# Patient Record
Sex: Female | Born: 1965
Health system: Southern US, Community
[De-identification: ages and names within clinical notes are randomized; demographics above are authoritative.]

## PROBLEM LIST (undated history)

## (undated) DIAGNOSIS — M797 Fibromyalgia: Secondary | ICD-10-CM

## (undated) DIAGNOSIS — Z8619 Personal history of other infectious and parasitic diseases: Secondary | ICD-10-CM

## (undated) DIAGNOSIS — J329 Chronic sinusitis, unspecified: Secondary | ICD-10-CM

## (undated) DIAGNOSIS — J40 Bronchitis, not specified as acute or chronic: Secondary | ICD-10-CM

## (undated) DIAGNOSIS — F32A Depression, unspecified: Secondary | ICD-10-CM

## (undated) DIAGNOSIS — I8390 Asymptomatic varicose veins of unspecified lower extremity: Secondary | ICD-10-CM

## (undated) DIAGNOSIS — R112 Nausea with vomiting, unspecified: Secondary | ICD-10-CM

## (undated) DIAGNOSIS — D649 Anemia, unspecified: Secondary | ICD-10-CM

## (undated) DIAGNOSIS — M1990 Unspecified osteoarthritis, unspecified site: Secondary | ICD-10-CM

## (undated) DIAGNOSIS — Z9889 Other specified postprocedural states: Secondary | ICD-10-CM

## (undated) DIAGNOSIS — G4730 Sleep apnea, unspecified: Secondary | ICD-10-CM

## (undated) DIAGNOSIS — R569 Unspecified convulsions: Secondary | ICD-10-CM

## (undated) DIAGNOSIS — R609 Edema, unspecified: Secondary | ICD-10-CM

## (undated) DIAGNOSIS — J189 Pneumonia, unspecified organism: Secondary | ICD-10-CM

## (undated) DIAGNOSIS — F329 Major depressive disorder, single episode, unspecified: Secondary | ICD-10-CM

## (undated) DIAGNOSIS — R002 Palpitations: Secondary | ICD-10-CM

## (undated) DIAGNOSIS — I4891 Unspecified atrial fibrillation: Secondary | ICD-10-CM

## (undated) DIAGNOSIS — E669 Obesity, unspecified: Secondary | ICD-10-CM

## (undated) HISTORY — DX: Palpitations: R00.2

## (undated) HISTORY — DX: Unspecified osteoarthritis, unspecified site: M19.90

## (undated) HISTORY — DX: Edema, unspecified: R60.9

## (undated) HISTORY — PX: ABDOMINAL HYSTERECTOMY: SHX81

## (undated) HISTORY — DX: Anemia, unspecified: D64.9

## (undated) HISTORY — DX: Pneumonia, unspecified organism: J18.9

## (undated) HISTORY — PX: VARICOSE VEIN SURGERY: SHX832

## (undated) HISTORY — PX: COLONOSCOPY: SHX174

## (undated) HISTORY — DX: Obesity, unspecified: E66.9

## (undated) HISTORY — DX: Unspecified convulsions: R56.9

## (undated) HISTORY — DX: Fibromyalgia: M79.7

## (undated) HISTORY — PX: ABLATION SAPHENOUS VEIN W/ RFA: SUR11

## (undated) HISTORY — DX: Personal history of other infectious and parasitic diseases: Z86.19

## (undated) HISTORY — DX: Chronic sinusitis, unspecified: J32.9

---

## 1989-08-15 HISTORY — PX: TUBAL LIGATION: SHX77

## 1999-09-14 ENCOUNTER — Emergency Department (HOSPITAL_COMMUNITY): Admission: EM | Admit: 1999-09-14 | Discharge: 1999-09-14 | Payer: Self-pay | Admitting: Emergency Medicine

## 1999-09-15 ENCOUNTER — Emergency Department (HOSPITAL_COMMUNITY): Admission: EM | Admit: 1999-09-15 | Discharge: 1999-09-15 | Payer: Self-pay | Admitting: *Deleted

## 2003-06-04 ENCOUNTER — Emergency Department (HOSPITAL_COMMUNITY): Admission: EM | Admit: 2003-06-04 | Discharge: 2003-06-04 | Payer: Self-pay | Admitting: Emergency Medicine

## 2003-06-04 ENCOUNTER — Encounter: Payer: Self-pay | Admitting: Emergency Medicine

## 2006-02-25 ENCOUNTER — Emergency Department (HOSPITAL_COMMUNITY): Admission: EM | Admit: 2006-02-25 | Discharge: 2006-02-25 | Payer: Self-pay | Admitting: Emergency Medicine

## 2009-09-27 ENCOUNTER — Emergency Department (HOSPITAL_COMMUNITY): Admission: EM | Admit: 2009-09-27 | Discharge: 2009-09-27 | Payer: Self-pay | Admitting: Emergency Medicine

## 2011-03-04 ENCOUNTER — Ambulatory Visit: Payer: Self-pay | Admitting: Family

## 2011-03-07 ENCOUNTER — Encounter: Payer: Self-pay | Admitting: Family

## 2011-03-07 ENCOUNTER — Ambulatory Visit (INDEPENDENT_AMBULATORY_CARE_PROVIDER_SITE_OTHER): Payer: BC Managed Care – PPO | Admitting: Family

## 2011-03-07 DIAGNOSIS — R635 Abnormal weight gain: Secondary | ICD-10-CM

## 2011-03-07 DIAGNOSIS — J329 Chronic sinusitis, unspecified: Secondary | ICD-10-CM | POA: Insufficient documentation

## 2011-03-07 DIAGNOSIS — R609 Edema, unspecified: Secondary | ICD-10-CM

## 2011-03-07 DIAGNOSIS — I8390 Asymptomatic varicose veins of unspecified lower extremity: Secondary | ICD-10-CM

## 2011-03-07 DIAGNOSIS — E669 Obesity, unspecified: Secondary | ICD-10-CM | POA: Insufficient documentation

## 2011-03-07 DIAGNOSIS — I83893 Varicose veins of bilateral lower extremities with other complications: Secondary | ICD-10-CM

## 2011-03-07 DIAGNOSIS — I83899 Varicose veins of unspecified lower extremities with other complications: Secondary | ICD-10-CM | POA: Insufficient documentation

## 2011-03-07 LAB — BASIC METABOLIC PANEL
BUN: 17 mg/dL (ref 6–23)
CO2: 22 mEq/L (ref 19–32)
Calcium: 9.1 mg/dL (ref 8.4–10.5)
Chloride: 104 mEq/L (ref 96–112)
Creat: 0.6 mg/dL (ref 0.50–1.10)
Glucose, Bld: 85 mg/dL (ref 70–99)
Potassium: 4.2 mEq/L (ref 3.5–5.3)
Sodium: 136 mEq/L (ref 135–145)

## 2011-03-07 LAB — TSH: TSH: 1.565 u[IU]/mL (ref 0.350–4.500)

## 2011-03-07 MED ORDER — AMOXICILLIN 500 MG PO CAPS: 1000.0000 mg | ORAL_CAPSULE | Freq: Two times a day (BID) | ORAL | Status: AC

## 2011-03-07 MED ORDER — FUROSEMIDE 20 MG PO TABS: 20.0000 mg | ORAL_TABLET | Freq: Two times a day (BID) | ORAL | Status: DC

## 2011-03-07 MED ORDER — FUROSEMIDE 20 MG PO TABS: ORAL_TABLET | ORAL | Status: DC

## 2011-03-07 NOTE — Assessment & Plan Note (Signed)
We discussed portion control, increasing fruits/veggies, and importance of regular exercise.  Will also check TSH to exclude medical factor contributing to her difficulty losing weight. And

## 2011-03-07 NOTE — Assessment & Plan Note (Signed)
Will refer to Vascular surgeon for further evaluation as she is have swelling and pain associated with these varicose veins.  Will also add lasix 20mg  once daily PRN.

## 2011-03-07 NOTE — Patient Instructions (Addendum)
Call if your sinus symptoms worsen or if they do not improve. Complete lab work on the first floor.  You will be contacted about your referral to the Vascular specialist. Follow up in 1 month, come fasting to this appointment.  Welcome to [facility]!

## 2011-03-07 NOTE — Progress Notes (Signed)
Subjective:    Patient ID: Alexandra Mata, female    DOB: [birth year], 45 y.o.   MRN: [id number]  HPI  Ms.  Klett is a 45 yr old female who presents today to establish care.  Daughter was recently diagnosed with Hashimoto's, age 9.    Seizure in HS- several seizures in HS- work up was negative.  No seizures since.    Leg swelling- reports that her Legs become very swollen at night.  Worse the last 2-3 months.  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  She reports + varicose veins "all my life."  She is interested in persuing treatment of her varicose veins.   Obesity- reports that she has been unable to "lose weight."  Not exercising regularly.    Yellow sinus drainage.  Symptoms started 3 weeks ago.  Symptoms are associated with nasal congestion. Denies associated HA or fever.  Symptoms improved  by nothing, and worsened by nothing.   Review of Systems  Constitutional: Negative for unexpected weight change.  HENT: Positive for rhinorrhea.   Eyes: Negative for visual disturbance.  Respiratory: Negative for cough.   Cardiovascular: Positive for leg swelling.       Notes occasional palpitations- sometimes associated with stress.  Gastrointestinal: Negative for nausea, vomiting and diarrhea.  Genitourinary: Negative for dysuria and hematuria.  Musculoskeletal: Negative for arthralgias.       Mild neck pain.  Skin: Negative for rash.  Neurological: Negative for headaches.  Hematological: Negative for adenopathy.  Psychiatric/Behavioral:       Denies depression or anxiety   Past Medical History  Diagnosis Date  . History of chicken pox   . Seizures     Pt states she had a couple of seizures in high school of undetermined cause    History   Social History  . Marital Status: Married    Spouse Name: N/A    Number of Children: 6  . Years of Education: N/A   Occupational History  . Not on file.   Social History Main  Topics  . Smoking status: Never Smoker   . Smokeless tobacco: Never Used  . Alcohol Use: No  . Drug Use: Not on file  . Sexually Active: Not on file   Other Topics Concern  . 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 Tubal ligation 1991    `    Family History  Problem Relation Age of Onset  . Arthritis Mother   . Hypertension Mother   . Diabetes Mother   . Asthma Mother   . Hypertension Father   . Diabetes Maternal Grandmother   . Hypertension Maternal Grandmother   . Heart disease Maternal Grandmother   . Diabetes Paternal Grandmother   . Cancer Paternal Grandfather     lung    No Known Allergies  No current outpatient prescriptions on file prior to visit.    BP 126/88  Pulse 78  Temp(Src) 97.8 F (36.6 C) (Oral)  Resp 16  Ht 5' 6.5" (1.689 m)  Wt 234 lb 1.9 oz (106.196 kg)  BMI 37.22 kg/m2  LMP 02/18/2011  Objective:   Physical Exam  Constitutional: She appears well-developed and well-nourished.       Overweight white female, NAD  HENT:  Right Ear: Tympanic membrane and ear canal normal.  Left Ear: Tympanic membrane and ear canal normal.  Mouth/Throat: Uvula is midline, oropharynx is clear and moist and mucous membranes are normal.  Eyes: Conjunctivae are normal.  Neck: Normal range of motion. Neck supple. No thyromegaly present.  Cardiovascular: Normal rate and regular rhythm.   Pulmonary/Chest: Effort normal and breath sounds normal.  Abdominal: Soft. Bowel sounds are normal.  Skin: Skin is warm and dry.       Tortuous bilateral LE varicose veins.  Multiple LE spider veins are also noted.     Psychiatric: She has a normal mood and affect. Her behavior is normal. Judgment and thought content normal.          Assessment & Plan:

## 2011-03-07 NOTE — Assessment & Plan Note (Signed)
Will treat with amoxicillin.

## 2011-03-08 ENCOUNTER — Encounter: Payer: Self-pay | Admitting: Family

## 2011-03-29 ENCOUNTER — Encounter: Payer: Self-pay | Admitting: Family

## 2011-04-04 ENCOUNTER — Encounter: Payer: Self-pay | Admitting: Family

## 2011-04-04 ENCOUNTER — Ambulatory Visit (INDEPENDENT_AMBULATORY_CARE_PROVIDER_SITE_OTHER): Payer: BC Managed Care – PPO | Admitting: Family

## 2011-04-04 ENCOUNTER — Other Ambulatory Visit (HOSPITAL_COMMUNITY)
Admission: RE | Admit: 2011-04-04 | Discharge: 2011-04-04 | Disposition: A | Discharge status: Home / Self Care | Payer: BC Managed Care – PPO | Source: Ambulatory Visit | Attending: Family | Admitting: Family

## 2011-04-04 DIAGNOSIS — Z01419 Encounter for gynecological examination (general) (routine) without abnormal findings: Secondary | ICD-10-CM

## 2011-04-04 DIAGNOSIS — Z23 Encounter for immunization: Secondary | ICD-10-CM

## 2011-04-04 DIAGNOSIS — Z0000 Encounter for general adult medical examination without abnormal findings: Secondary | ICD-10-CM | POA: Insufficient documentation

## 2011-04-04 DIAGNOSIS — E669 Obesity, unspecified: Secondary | ICD-10-CM

## 2011-04-04 NOTE — Assessment & Plan Note (Addendum)
Pt was counseled on diet, exercise, weight loss.  Recommended 30 minutes a day of exercise and to keep calories to 1200 a day.  Will also refer to nutritionist at pt request to help with weight loss. Pap performed today.  Mammogram ordered.  Will plan to have pt return fasting for FLP.  EKG performed today and shows normal sinus rhythm without ST changes.

## 2011-04-04 NOTE — Progress Notes (Signed)
Subjective:    Patient ID: Alexandra Mata, female    DOB: 1965-11-10, 45 y.o.   MRN: [id number]  HPI  Drinking more water, cut out sweets,  Walking 30 minutes a week.  Brother recently had an MI.  Last pap smear was >10 yrs ago.  Does not have a GYN. Last mammogram- never. Last tetanus >10 yrs ago.  Brother had MI recently which is worrying her.    Edema- Rarely using lasix due to "dry mouth" side effect.  She does report some improvement in the edema when she does use it.   Varicose Veins- has apt arranged for consultation with vascular.   Review of Systems  Constitutional: Negative for fever.  HENT: Negative for hearing loss.   Eyes:       Needs eye exam.  "blurry vision with reading."  Respiratory: Negative for shortness of breath.   Cardiovascular: Positive for leg swelling. Negative for chest pain.  Gastrointestinal: Negative for nausea, vomiting and abdominal pain.  Genitourinary: Negative for dysuria and frequency.  Musculoskeletal:       Bilateral knee pain.  Neurological:       Notes occasional HA's  Hematological: Negative for adenopathy.  Psychiatric/Behavioral:       Some stress with family.  Denies depression/anxiety   Past Medical History  Diagnosis Date  . History of chicken pox   . Seizures     Pt states she had a couple of seizures in high school of undetermined cause    History   Social History  . Marital Status: Married    Spouse Name: N/A    Number of Children: 6  . Years of Education: N/A   Occupational History  . Not on file.   Social History Main Topics  . Smoking status: Never Smoker   . Smokeless tobacco: Never Used  . Alcohol Use: No  . Drug Use: Not on file  . Sexually Active: Not on file   Other Topics Concern  . 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 Tubal ligation 1991    `    Family History  Problem Relation Age of Onset  . Arthritis Mother   . Hypertension Mother   . Diabetes Mother   . Asthma Mother   . Hypertension Father   . Diabetes Maternal Grandmother   . Hypertension Maternal Grandmother   . Heart disease Maternal Grandmother   . Diabetes Paternal Grandmother   . Cancer Paternal Grandfather     lung  . Coronary artery disease Brother     No Known Allergies  Current Outpatient Prescriptions on File Prior to Visit  Medication Sig Dispense Refill  . B Complex Vitamins (B-COMPLEX/B-12) TABS Take 2 tablets by mouth daily.        . furosemide (LASIX) 20 MG tablet One tablet once daily as needed for swelling.  30 tablet  0    BP 120/80  Pulse 84  Temp(Src) 97.8 F (36.6 C) (Oral)  Resp 16  Ht 5' 6.5" (1.689 m)  Wt 235 lb 1.3 oz (106.632 kg)  BMI 37.37 kg/m2  LMP 03/21/2011        Objective:  Physical Exam  Constitutional: She is oriented to person, place, and time. She appears well-developed and well-nourished.       Overweight white female, NAD  HENT:  Head: Normocephalic and atraumatic.  Right Ear: Tympanic membrane and ear canal normal.  Left Ear: Tympanic membrane and ear canal normal.  Neck: Normal range of motion. Neck supple. No thyromegaly present.  Cardiovascular: Normal rate.  Exam reveals no friction rub.   No murmur heard. Pulmonary/Chest: Effort normal and breath sounds normal. No respiratory distress. She has no wheezes. She has no rales. She exhibits no tenderness.  Abdominal: Soft. Bowel sounds are normal. She exhibits no distension and no mass. There is no tenderness. There is no rebound and no guarding.  Genitourinary: Vagina normal. No breast swelling, tenderness or discharge. Pelvic exam was performed with  patient prone. No erythema around the vagina. No signs of injury around the vagina. No vaginal discharge found.       Cervix is normal without lesions.  Uterine size is normal, adnexa normal without fullness.   Musculoskeletal: Normal range of motion.  Lymphadenopathy:    She has no cervical adenopathy.  Neurological: She is alert and oriented to person, place, and time. She has normal reflexes.  Skin: Skin is warm and dry.  Psychiatric: She has a normal mood and affect. Her behavior is normal. Judgment and thought content normal.          Assessment & Plan:

## 2011-04-04 NOTE — Patient Instructions (Addendum)
Return to the lab fasting one day this week. You will be contacted about our referral to the nutritionist. Try to keep your calories to 1200 a day and exercise at least 30 minutes a day. Record your calories daily : [email or web address] Follow up in 6 months, sooner if problems or concerns.

## 2011-04-05 ENCOUNTER — Encounter: Payer: Self-pay | Admitting: Family

## 2011-04-07 ENCOUNTER — Encounter: Payer: Self-pay | Admitting: Vascular Surgery

## 2011-04-11 ENCOUNTER — Ambulatory Visit: Payer: BC Managed Care – PPO | Admitting: *Deleted

## 2011-05-02 ENCOUNTER — Telehealth: Payer: Self-pay | Admitting: Family

## 2011-05-02 NOTE — Telephone Encounter (Signed)
Pt is due for fasting labs as outlined in 8/20 note.  Please call pt and ask her to return fasting to the lab this week.

## 2011-05-03 NOTE — Telephone Encounter (Signed)
Left message on machine to return my call. 

## 2011-05-04 NOTE — Telephone Encounter (Signed)
Pt notified and will return to the lab today. Order faxed to the lab.

## 2011-05-06 ENCOUNTER — Encounter: Payer: Self-pay | Admitting: Vascular Surgery

## 2011-05-06 ENCOUNTER — Other Ambulatory Visit: Payer: Self-pay

## 2011-05-06 DIAGNOSIS — I83893 Varicose veins of bilateral lower extremities with other complications: Secondary | ICD-10-CM

## 2011-05-09 ENCOUNTER — Encounter: Payer: Self-pay | Admitting: Vascular Surgery

## 2011-05-09 ENCOUNTER — Ambulatory Visit (INDEPENDENT_AMBULATORY_CARE_PROVIDER_SITE_OTHER): Payer: BC Managed Care – PPO | Admitting: Vascular Surgery

## 2011-05-09 ENCOUNTER — Other Ambulatory Visit (INDEPENDENT_AMBULATORY_CARE_PROVIDER_SITE_OTHER): Payer: BC Managed Care – PPO | Admitting: Vascular Surgery

## 2011-05-09 VITALS — BP 119/78 | HR 88 | Resp 20 | Ht 67.0 in | Wt 220.0 lb

## 2011-05-09 DIAGNOSIS — M7989 Other specified soft tissue disorders: Secondary | ICD-10-CM

## 2011-05-09 DIAGNOSIS — I83893 Varicose veins of bilateral lower extremities with other complications: Secondary | ICD-10-CM

## 2011-05-09 DIAGNOSIS — M79609 Pain in unspecified limb: Secondary | ICD-10-CM

## 2011-05-09 NOTE — Progress Notes (Signed)
Addended by: Merri Ray A on: 05/09/2011 03:15 PM   Modules accepted: Orders

## 2011-05-09 NOTE — Progress Notes (Signed)
HERE TO ASSESS HER BILATERAL VARICOSE VEINS.

## 2011-05-09 NOTE — Progress Notes (Signed)
Subjective:     Patient ID: Alexandra Mata, female   DOB: 06-Jan-1966, 45 y.o.   MRN: [id number]  HPI this 45 year old female was referred by Dr. Peggyann Juba for evaluation of bilateral venous insufficiency. The patient has had varicose veins in both legs for the past 20 years or so. She has had a history of one stasis ulcer in the left ankle a few months ago which did heal. She has had no bleeding, DVT, thrombophlebitis, or skin breakdown on the right leg. She does not wear elastic compression stockings and only elevates her legs on occasion although it does make her legs feel better. He is not taking pain medicine a regular basis. She has been having increasing discomfort in the legs as the day progresses. She describes this as an aching throbbing burning discomfort in the thigh and calf areas right worse than left.  Past Medical History  Diagnosis Date  . History of chicken pox   . Seizures     Pt states she had a couple of seizures in high school of undetermined cause  . Asymptomatic varicose veins   . Leg swelling   . Obesity   . Edema   . Unspecified sinusitis (chronic)     History  Substance Use Topics  . Smoking status: Never Smoker   . Smokeless tobacco: Never Used  . Alcohol Use: No    Family History  Problem Relation Age of Onset  . Arthritis Mother   . Hypertension Mother   . Diabetes Mother   . Asthma Mother   . Hypertension Father   . Diabetes Maternal Grandmother   . Hypertension Maternal Grandmother   . Heart disease Maternal Grandmother   . Diabetes Paternal Grandmother   . Cancer Paternal Grandfather     lung  . Coronary artery disease Brother     No Known Allergies  Current outpatient prescriptions:amoxicillin (AMOXIL) 500 MG capsule, Take 500 mg by mouth 2 (two) times daily.  , Disp: , Rfl: ;  B Complex Vitamins (B-COMPLEX/B-12) TABS, Take 2 tablets by mouth daily.  , Disp: , Rfl: ;  furosemide (LASIX) 20 MG tablet, One tablet once daily as needed for  swelling., Disp: 30 tablet, Rfl: 0  BP 119/78  Pulse 88  Resp 20  Ht 5\' 7"  (1.702 m)  Wt 220 lb (99.791 kg)  BMI 34.46 kg/m2  Body mass index is 34.46 kg/(m^2).         Review of Systems chief complaints of occasional dizziness, blackouts, headaches, seizure in 1984 and 1993, bronchitis, leg discomfort with walking. All other systems are completely negative and a complete review of systems     Objective:   Physical Exam blood pressure 119/78 heart rate 88 respirations 20  General she is well-developed obese middle-aged female no apparent distress alert and oriented x3 Chest clear no rhonchi or wheezing Cardiovascular regular rhythm no murmurs carotid pulses 3+ no bruits Abdomen soft obese no palpable masses Neurologic exam normal Musculoskeletal exam for emotional deformities Skin free of rashes Extremity exam 3+ femoral and popliteal and dorsalis pedis pulses palpable bilaterally She has diffuse bulging varicosities in the right leg beginning at the saphenofemoral junction and extending anterolaterally lateral to the knee and into the lateral calf with multiple spider and reticular veins anteriorly and posteriorly and 2+ edema  Left leg has diffuse spider and reticular veins with no bulging varicosities noted with 2+ edema in an area in the left ankle where the previous ulcer has healed  Today I ordered bilateral venous duplex exam which I reviewed and interpreted. She does have significant reflux bilaterally. On the right side this involves the anterolateral branch of the great saphenous vein at the junction and the right small saphenous vein and on the left side it involves the left anterolateral branch up to the junction.    Assessment:    bilateral venous insufficiency secondary to venous hypertension from valvular incompetence and reflux. I'm not certain we have a long enough segment in the anterolateral branches for laser ablation. She does need stab phlebectomy for  treatment of her symptomatology.    Plan:     #1 lumbar elastic compression stockings-20-30 mm gradient #2 elevate legs as much as possible #3 ibuprofen on a daily basis #4 return in 3 months. At that time we will see if conservative regimen has improved her symptomatology. If not she would be a candidate for stab phlebectomy of varicosities right leg. We will reassess her anterolateral right saphenous veins and right small saphenous vein to see if laser ablation would be an option. We will do this with the SonoSite ultrasound at the bedside

## 2011-05-10 ENCOUNTER — Encounter: Payer: BC Managed Care – PPO | Admitting: Vascular Surgery

## 2011-05-13 ENCOUNTER — Encounter (INDEPENDENT_AMBULATORY_CARE_PROVIDER_SITE_OTHER): Payer: BC Managed Care – PPO

## 2011-05-13 DIAGNOSIS — I83893 Varicose veins of bilateral lower extremities with other complications: Secondary | ICD-10-CM

## 2011-05-14 LAB — HEPATIC FUNCTION PANEL
Alkaline Phosphatase: 42 U/L (ref 39–117)
Bilirubin, Direct: 0.2 mg/dL (ref 0.0–0.3)
Indirect Bilirubin: 0.4 mg/dL (ref 0.0–0.9)

## 2011-05-14 LAB — CBC WITH DIFFERENTIAL/PLATELET
Basophils Absolute: 0 10*3/uL (ref 0.0–0.1)
Eosinophils Relative: 2 % (ref 0–5)
HCT: 35.6 % — ABNORMAL LOW (ref 36.0–46.0)
Lymphocytes Relative: 24 % (ref 12–46)
Lymphs Abs: 1.6 10*3/uL (ref 0.7–4.0)
MCV: 89.7 fL (ref 78.0–100.0)
Neutro Abs: 4.5 10*3/uL (ref 1.7–7.7)
Platelets: 192 10*3/uL (ref 150–400)
RBC: 3.97 MIL/uL (ref 3.87–5.11)
RDW: 13.5 % (ref 11.5–15.5)
WBC: 6.9 10*3/uL (ref 4.0–10.5)

## 2011-05-14 LAB — LIPID PANEL
HDL: 40 mg/dL (ref >39)
LDL Cholesterol: 88 mg/dL (ref 0–99)
Total CHOL/HDL Ratio: 3.5 Ratio
Triglycerides: 51 mg/dL (ref <150)
VLDL: 10 mg/dL (ref 0–40)

## 2011-05-16 NOTE — Procedures (Unsigned)
LOWER EXTREMITY VENOUS REFLUX EXAM  INDICATION:  Bilateral lower extremity pain, varicose veins, and edema.  EXAM:  Using color-flow imaging and pulse Doppler spectral analysis, the bilateral common femoral, superficial femoral, popliteal, posterior tibial, greater and lesser saphenous veins are evaluated.  There is no evidence suggesting deep venous insufficiency in the bilateral lower extremities.  The bilateral saphenofemoral junctions are competent.  The right GSV is competent.  The left GSV is not competent with reflux of greater than with the caliber as described below.  The right proximal short saphenous vein demonstrates incompetency. Diameter measurements range from 0.26 cm to 0.54 cm.  The left proximal short saphenous vein demonstrates competency.  GSV Diameter (used if found to be incompetent only)                                           Right    Left Proximal Greater Saphenous Vein           cm       0.71 cm Proximal-to-mid-thigh                     0.42 cm  0.47 cm Mid thigh                                 0.32 cm  0.34 cm Mid-distal thigh                          cm       cm Distal thigh                              0.41 cm  0.45 cm Knee                                      0.41 cm  0.25 cm  IMPRESSION: 1. The right greater saphenous vein is competent. 2. The greater saphenous vein is not competent with reflux of greater     than500 milliseconds. 3. The bilateral greater saphenous veins are not tortuous. 4. The deep venous system bilaterally is competent. 5. The right lesser saphenous vein is not competent with reflux of     greater than 500 milliseconds. 6. The left small saphenous vein is competent. 7. There is a right proximal medial calf perforator vein arising off     the great saphenous vein, terminating in one posterior tibial vein,     measuring 0.33 cm in diameter and demonstrating mild reflux of 490      milliseconds.        ___________________________________________ Quita Skye Hart [place], M.D.  SH/MEDQ  D:  05/09/2011  T:  05/09/2011  Job:  [id number]

## 2011-05-17 ENCOUNTER — Encounter: Payer: Self-pay | Admitting: Family

## 2011-05-17 ENCOUNTER — Telehealth: Payer: Self-pay | Admitting: Family

## 2011-05-17 DIAGNOSIS — D649 Anemia, unspecified: Secondary | ICD-10-CM

## 2011-05-17 HISTORY — DX: Anemia, unspecified: D64.9

## 2011-05-17 NOTE — Telephone Encounter (Signed)
Pt notified and voices understanding. Pt will return to the lab around the last week of December. Lab order entered and forwarded to the lab. Lab reminder mailed to pt.

## 2011-05-17 NOTE — Telephone Encounter (Signed)
Pls call pt and let her know Pap smear normal.  She is mildly anemic.  I would like for her to add a multivitamin with minerals and plan to follow up in 3 months for lab draw (CBC- diagnosis anemia).  Other labs look good.

## 2011-07-13 ENCOUNTER — Encounter: Payer: Self-pay | Admitting: Family

## 2011-07-13 ENCOUNTER — Ambulatory Visit (INDEPENDENT_AMBULATORY_CARE_PROVIDER_SITE_OTHER): Payer: BC Managed Care – PPO | Admitting: Family

## 2011-07-13 VITALS — BP 120/82 | HR 84 | Temp 97.8°F | Resp 16 | Wt 230.1 lb

## 2011-07-13 DIAGNOSIS — N912 Amenorrhea, unspecified: Secondary | ICD-10-CM | POA: Insufficient documentation

## 2011-07-13 DIAGNOSIS — D649 Anemia, unspecified: Secondary | ICD-10-CM

## 2011-07-13 DIAGNOSIS — J329 Chronic sinusitis, unspecified: Secondary | ICD-10-CM | POA: Insufficient documentation

## 2011-07-13 DIAGNOSIS — M79603 Pain in arm, unspecified: Secondary | ICD-10-CM | POA: Insufficient documentation

## 2011-07-13 DIAGNOSIS — M79609 Pain in unspecified limb: Secondary | ICD-10-CM

## 2011-07-13 LAB — ESTRADIOL: Estradiol: 54.3 pg/mL

## 2011-07-13 MED ORDER — CEFUROXIME AXETIL 500 MG PO TABS: 500.0000 mg | ORAL_TABLET | Freq: Two times a day (BID) | ORAL | Status: AC

## 2011-07-13 MED ORDER — MELOXICAM 7.5 MG PO TABS: 7.5000 mg | ORAL_TABLET | Freq: Every day | ORAL | Status: DC

## 2011-07-13 NOTE — Assessment & Plan Note (Signed)
Trial of ceftin.  Was treated with amoxicillin most recently.

## 2011-07-13 NOTE — Progress Notes (Signed)
Subjective:    Patient ID: Alexandra Mata, female    DOB: February 05, 1966, 45 y.o.   MRN: [id number]  HPI  1) sinus congestion-  Reports that he has constant headache.  Tried mucinex.  Some improvement with abx and worsened as soon as she stopped. + yellow nasal discharge.   2) Menstrual problem- reports that she has not had a period since September 6th. Has taken a home pregnancy test.    3) Notes weakness in the left arm- aching, decreased strenth. Mild neck pain, saw a chiropractor which did not help.    Review of Systems See HPI  Past Medical History  Diagnosis Date  . History of chicken pox   . Seizures     Pt states she had a couple of seizures in high school of undetermined cause  . Asymptomatic varicose veins   . Leg swelling   . Obesity   . Edema   . Unspecified sinusitis (chronic)   . Anemia 05/17/2011    History   Social History  . Marital Status: Married    Spouse Name: N/A    Number of Children: 6  . Years of Education: N/A   Occupational History  . Not on file.   Social History Main Topics  . Smoking status: Never Smoker   . Smokeless tobacco: Never Used  . Alcohol Use: No  . Drug Use: Not on file  . Sexually Active: Not on file   Other Topics Concern  . 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 Tubal ligation 1991    `    Family History  Problem Relation Age of Onset  . Arthritis Mother   . Hypertension Mother   . Diabetes Mother   . Asthma Mother   . Hypertension Father   . Diabetes Maternal Grandmother   . Hypertension Maternal Grandmother   . Heart disease Maternal Grandmother   . Diabetes  Paternal Grandmother   . Cancer Paternal Grandfather     lung  . Coronary artery disease Brother     No Known Allergies  Current Outpatient Prescriptions on File Prior to Visit  Medication Sig Dispense Refill  . B Complex Vitamins (B-COMPLEX/B-12) TABS Take 2 tablets by mouth daily.        . Multiple Vitamins-Minerals (MULTIVITAMIN WITH MINERALS) tablet 1 tablet.        . furosemide (LASIX) 20 MG tablet One tablet once daily as needed for swelling.  30 tablet  0    BP 120/82  Pulse 84  Temp(Src) 97.8 F (36.6 C) (Oral)  Resp 16  Wt 230 lb 1.3 oz (104.364 kg)  SpO2 100%  LMP 04/21/2011       Objective:   Physical Exam  Constitutional: She appears well-developed and well-nourished. No distress.  HENT:  Head: Normocephalic and atraumatic.  Right Ear: Tympanic membrane and ear canal normal.  Left Ear: Tympanic membrane and ear canal normal.  Mouth/Throat: No posterior oropharyngeal edema.       Mild maxillary/frontal sinus tenderness to palpation bilaterally.   Cardiovascular: Normal rate and regular rhythm.   No murmur heard. Pulmonary/Chest: Effort normal and breath sounds normal. No respiratory distress.  She has no wheezes. She has no rales. She exhibits no tenderness.  Musculoskeletal:       Left forearm: She exhibits tenderness. She exhibits no swelling.       Bilateral UE strength is 5/5, strong equal hand grasps bilaterally, Bilat LE strength is 5/5.  Skin: Skin is warm and dry.  Psychiatric: She has a normal mood and affect. Her behavior is normal. Judgment and thought content normal.          Assessment & Plan:

## 2011-07-13 NOTE — Patient Instructions (Signed)
Please complete your lab work prior to leaving today.  Call if your symptoms worsen or if they do not improve.  Follow up in 3 months.

## 2011-07-13 NOTE — Assessment & Plan Note (Signed)
Urine HCG is negative.  I suspect perimenopausal. Check hormone levels.

## 2011-07-13 NOTE — Assessment & Plan Note (Signed)
Trial of mobic.

## 2011-07-14 ENCOUNTER — Encounter: Payer: Self-pay | Admitting: Family

## 2011-07-20 ENCOUNTER — Telehealth: Payer: Self-pay | Admitting: *Deleted

## 2011-07-20 NOTE — Telephone Encounter (Signed)
Received message from pt wanting to know her most recent lab results. Spoke with pt and advised her of test results per 07/15/11 letter.

## 2011-07-27 ENCOUNTER — Telehealth: Payer: Self-pay | Admitting: *Deleted

## 2011-07-27 NOTE — Telephone Encounter (Signed)
Received message from pt stating she has completed prescriptions from 07/13/11 x 3 days. Feels worse now, symptoms not relieved. Has severe headache and just feels bad. Attempted to reach pt and left message for her to return my call. Please advise.

## 2011-07-27 NOTE — Telephone Encounter (Signed)
Left message on machine to return my call. 

## 2011-07-27 NOTE — Telephone Encounter (Signed)
Pt will need OV please.

## 2011-07-28 NOTE — Telephone Encounter (Signed)
Left message on machine to return my call. 

## 2011-07-29 NOTE — Telephone Encounter (Signed)
Spoke to pt, she reports some relief of symptoms with OTC meds. Headache has resolved. Pt will call us Monday if she feels she needs to be seen.

## 2011-08-23 ENCOUNTER — Ambulatory Visit: Payer: BC Managed Care – PPO | Admitting: Vascular Surgery

## 2011-08-29 ENCOUNTER — Encounter: Payer: Self-pay | Admitting: Vascular Surgery

## 2011-08-30 ENCOUNTER — Ambulatory Visit (INDEPENDENT_AMBULATORY_CARE_PROVIDER_SITE_OTHER): Payer: BC Managed Care – PPO | Admitting: Vascular Surgery

## 2011-08-30 ENCOUNTER — Encounter: Payer: Self-pay | Admitting: Vascular Surgery

## 2011-08-30 VITALS — BP 120/99 | HR 86 | Resp 20 | Ht 67.0 in | Wt 225.0 lb

## 2011-08-30 DIAGNOSIS — I83893 Varicose veins of bilateral lower extremities with other complications: Secondary | ICD-10-CM | POA: Insufficient documentation

## 2011-08-30 NOTE — Progress Notes (Signed)
Subjective:     Patient ID: Alexandra Mata, female   DOB: 11-Sep-1965, 46 y.o.   MRN: [id number]  HPI this patient returns today for further evaluation of her severe venous insufficiency of both lower extremities she has been having severe edema and has a history of stasis ulcer in the left ankle. She has aching throbbing burning discomfort in both legs throughout the day which worsens as the day progresses. She has tried were long light elastic compression stockings (20-30 mm gradient) over the past 3 months but her legs are quite large it is difficult for these to stay up. They do not relieve her symptomatology. Her symptoms have been progressing over the past 15-20 years and are now unable to be relieved even with elevation and ibuprofen.  Past Medical History  Diagnosis Date  . History of chicken pox   . Seizures     Pt states she had a couple of seizures in high school of undetermined cause  . Asymptomatic varicose veins   . Leg swelling   . Obesity   . Edema   . Unspecified sinusitis (chronic)   . Anemia 05/17/2011    History  Substance Use Topics  . Smoking status: Never Smoker   . Smokeless tobacco: Never Used  . Alcohol Use: No    Family History  Problem Relation Age of Onset  . Arthritis Mother   . Hypertension Mother   . Diabetes Mother   . Asthma Mother   . Hypertension Father   . Diabetes Maternal Grandmother   . Hypertension Maternal Grandmother   . Heart disease Maternal Grandmother   . Diabetes Paternal Grandmother   . Cancer Paternal Grandfather     lung  . Coronary artery disease Brother     No Known Allergies  Current outpatient prescriptions:B Complex Vitamins (B-COMPLEX/B-12) TABS, Take 2 tablets by mouth daily.  , Disp: , Rfl: ;  meloxicam (MOBIC) 7.5 MG tablet, Take 1 tablet (7.5 mg total) by mouth daily., Disp: 15 tablet, Rfl: 0;  Multiple Vitamins-Minerals (MULTIVITAMIN WITH MINERALS) tablet, 1 tablet.  , Disp: , Rfl:   BP 120/99  Pulse 86  Resp 20   Ht 5\' 7"  (1.702 m)  Wt 225 lb (102.059 kg)  BMI 35.24 kg/m2  Body mass index is 35.24 kg/(m^2).        Review of Systems     Objective:   Physical Exam blood pressure 120/99 heart rate 86 respirations 20 General she is an obese female is in no apparent stress alert and oriented x3 Lungs no rhonchi or wheezing Lower extremity exam-right leg has 3+ femoral dorsalis pedis pulse palpable. There is 2+ edema throughout the right leg extending down to the ankle. There are diffuse bulging varicosities beginning in the proximal anterior thigh extending laterally down to the knee and into the lateral calf with diffuse spider and reticular veins throughout Left leg has severe diffuse edema at 2+ throughout down to the ankle level with diffuse spider and reticular veins in bulging varicosity in the left anterior lateral thigh extending lateral to the knee into the proximal calf.  Today I reviewed her situation with with the Son[facility] ultrasound exam with a personal study and compared it to the recent study done in our vascular lab in September of 2012. She does have gross reflux in the right proximal great saphenous veins supplying these bulging varicosities in the anterior thigh and calf. The right small saphenous vein appears to small to treat. The left great  saphenous vein does appear to have reflux from the junction down medially to the mid thigh.     Assessment:     Severe venous insufficiency bilaterally with an element of lymphedema involved. She does have bilateral great saphenous reflux plying these varicosities and contributing to her edema and pain. Symptoms have been resistant to conservative therapy    Plan:     I feel the best plan would be to perform 1 laser ablation proximal right great saphenous vein with greater than 20 stab phlebectomy #2 laser ablation left great saphenous vein with 10-20 stab phlebectomy. I have explained to her that this will not completely relieve her edema  which I think is partially lymphedema but it may well improve her symptomatology which so far has been progressively getting worse despite conservative treatment. We will proceed with precertification to perform this in the near future

## 2011-09-13 ENCOUNTER — Encounter: Payer: Self-pay | Admitting: Family

## 2011-09-13 ENCOUNTER — Ambulatory Visit (INDEPENDENT_AMBULATORY_CARE_PROVIDER_SITE_OTHER): Payer: BC Managed Care – PPO | Admitting: Family

## 2011-09-13 VITALS — BP 110/80 | HR 88 | Temp 98.0°F | Resp 16 | Wt 234.1 lb

## 2011-09-13 DIAGNOSIS — J029 Acute pharyngitis, unspecified: Secondary | ICD-10-CM

## 2011-09-13 DIAGNOSIS — J329 Chronic sinusitis, unspecified: Secondary | ICD-10-CM

## 2011-09-13 DIAGNOSIS — J020 Streptococcal pharyngitis: Secondary | ICD-10-CM

## 2011-09-13 MED ORDER — AMOXICILLIN-POT CLAVULANATE 875-125 MG PO TABS: 1.0000 | ORAL_TABLET | Freq: Two times a day (BID) | ORAL | Status: AC

## 2011-09-13 MED ORDER — FLUTICASONE PROPIONATE 50 MCG/ACT NA SUSP: 2.0000 | Freq: Every day | NASAL | Status: DC

## 2011-09-13 NOTE — Progress Notes (Signed)
Subjective:    Patient ID: Alexandra Mata, female    DOB: 09-Dec-1965, 46 y.o.   MRN: [id number]  HPI  Sore throat- started this AM.  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  Feels flushed, but has had no fever.  Notes ongoing sinus congestion.  She reports sinus pressure, right cheek pressure.    She tells me that she saw the vascular specialist and is scheduled to undergo procedure for her varicose veins.  It is felt that the majority of her swelling is due to lymphedema though, not her varicose veins.  Review of Systems    see HPI  Past Medical History  Diagnosis Date  . History of chicken pox   . Seizures     Pt states she had a couple of seizures in high school of undetermined cause  . Asymptomatic varicose veins   . Leg swelling   . Obesity   . Edema   . Unspecified sinusitis (chronic)   . Anemia 05/17/2011    History   Social History  . Marital Status: Married    Spouse Name: N/A    Number of Children: 6  . Years of Education: N/A   Occupational History  . Not on file.   Social History Main Topics  . Smoking status: Never Smoker   . Smokeless tobacco: Never Used  . Alcohol Use: No  . Drug Use: Not on file  . Sexually Active: Not on file   Other Topics Concern  . Not on file   Social History Narrative   Regular exercise:  NoCaffeine Use:  No3 biological children, 3 stepchildren1990  [place]- one child aged 21991- [id number]- Daughter [name] (three children ages 35, 5 and 6)Etoh- deniesNever smoked.Denies drug useAsh[employer] University- working on her bachelors in Early Childhood Education.Married to Annye English    Past Surgical History  Procedure Date  . Tubal ligation 1991    `    Family History  Problem Relation Age of Onset  . Arthritis Mother   . Hypertension Mother   . Diabetes Mother   . Asthma Mother   . Hypertension Father   . Diabetes Maternal Grandmother   . Hypertension Maternal Grandmother   . Heart disease  Maternal Grandmother   . Diabetes Paternal Grandmother   . Cancer Paternal Grandfather     lung  . Coronary artery disease Brother     No Known Allergies  Current Outpatient Prescriptions on File Prior to Visit  Medication Sig Dispense Refill  . B Complex Vitamins (B-COMPLEX/B-12) TABS Take 2 tablets by mouth daily.        . meloxicam (MOBIC) 7.5 MG tablet Take 1 tablet (7.5 mg total) by mouth daily.  15 tablet  0  . Multiple Vitamins-Minerals (MULTIVITAMIN WITH MINERALS) tablet 1 tablet.          BP 110/80  Pulse 88  Temp(Src) 98 F (36.7 C) (Oral)  Resp 16  Wt 234 lb 1.3 oz (106.178 kg)  SpO2 99%    Objective:   Physical Exam  Constitutional: She appears well-developed and well-nourished. No distress.  HENT:  Head: Normocephalic and atraumatic.  Right Ear: Tympanic membrane and ear canal normal.  Left Ear: Tympanic membrane and ear canal normal.  Mouth/Throat: Posterior oropharyngeal erythema present. No oropharyngeal exudate or posterior oropharyngeal edema.       Small blister noted at top of right tonsil.  Cardiovascular: Normal rate and regular rhythm.   No murmur heard. Pulmonary/Chest:  Effort normal and breath sounds normal. No respiratory distress. She has no wheezes. She has no rales. She exhibits no tenderness.  Extrem: + swelling of bilateral legs to the ankles. No significant foot swelling is noted.         Assessment & Plan:

## 2011-09-13 NOTE — Patient Instructions (Signed)
Please call if your symptoms worsen or if no improvement in 2-3 days. 

## 2011-09-13 NOTE — Assessment & Plan Note (Signed)
Will plan to treat with augmentin. 

## 2011-09-13 NOTE — Assessment & Plan Note (Signed)
Treat with Augmentin for 10 days. Start flonase and a daily antihistamine to hopefully decrease recurrent infections.  Consider ENT referral if symptoms worsen or do not improve with these measures.

## 2011-09-14 ENCOUNTER — Other Ambulatory Visit: Payer: Self-pay | Admitting: *Deleted

## 2011-09-14 DIAGNOSIS — I83899 Varicose veins of unspecified lower extremities with other complications: Secondary | ICD-10-CM

## 2011-09-26 ENCOUNTER — Ambulatory Visit: Payer: BC Managed Care – PPO | Admitting: Family

## 2011-10-07 ENCOUNTER — Encounter: Payer: Self-pay | Admitting: Vascular Surgery

## 2011-10-10 ENCOUNTER — Encounter: Payer: Self-pay | Admitting: Vascular Surgery

## 2011-10-10 ENCOUNTER — Ambulatory Visit (INDEPENDENT_AMBULATORY_CARE_PROVIDER_SITE_OTHER): Payer: BC Managed Care – PPO | Admitting: Vascular Surgery

## 2011-10-10 VITALS — BP 108/74 | HR 84 | Resp 18 | Ht 67.0 in | Wt 235.0 lb

## 2011-10-10 DIAGNOSIS — I83893 Varicose veins of bilateral lower extremities with other complications: Secondary | ICD-10-CM

## 2011-10-10 NOTE — Progress Notes (Signed)
Subjective:     Patient ID: Alexandra Mata, female   DOB: September 10, 1965, 46 y.o.   MRN: [id number]  HPI this 46 year old female had laser ablation of the right great saphenous vein from the knee to the saphenofemoral junction with greater than 20 stab phlebectomy of painful varicosities in the thigh and lateral calf. This was done under local tumescent anesthesia. She tolerated the procedure well. A total of 1947 J of energy was utilized.   Review of Systems     Objective:   Physical ExamBP 108/74  Pulse 84  Resp 18  Ht 5\' 7"  (1.702 m)  Wt 235 lb (106.595 kg)  BMI 36.81 kg/m2     Assessment:     Well-tolerated laser ablation right great saphenous vein with greater than 20 stab phlebectomy under local tumescent anesthesia.    Plan:     Return in one week for a venous duplex exam to confirm closure right great saphenous vein. Will be scheduled for similar procedure on the contralateral left leg and near future

## 2011-10-10 NOTE — Progress Notes (Signed)
Laser Ablation Procedure      Date: 10/10/2011    Alexandra Mata DOB:1966-05-08  Consent signed: Yes  Surgeon:J.D. Hart Rochester  Procedure: Laser Ablation: right Greater Saphenous Vein  BP 108/74  Pulse 84  Resp 18  Ht 5\' 7"  (1.702 m)  Wt 235 lb (106.595 kg)  BMI 36.81 kg/m2  Start time: 3:00   End time: 4:20  Tumescent Anesthesia: 500 cc 0.9% NaCl with 50 cc Lidocaine HCL with 1% Epi and 15 cc 8.4% NaHCO3  Local Anesthesia: 20 cc Lidocaine HCL and NaHCO3 (ratio 2:1)  Pulsed mode: Watts 15 Seconds 1 Pulses:1 Total Pulses:134 Total Energy: 1947 Total Time: 2:09    Stab Phlebectomy: greater than 20 Sites: Thigh  Patient tolerated procedure well: Yes  Description of Procedure:  After marking the course of the saphenous vein and the secondary varicosities in the standing position, the patient was placed on the operating table in the supine position, and the right leg was prepped and draped in sterile fashion. Local anesthetic was administered, and under ultrasound guidance the saphenous vein was accessed with a micro needle and guide wire; then the micro puncture sheath was placed. A guide wire was inserted to the saphenofemoral junction, followed by a 5 french sheath.  The position of the sheath and then the laser fiber below the junction was confirmed using the ultrasound and visualization of the aiming beam.  Tumescent anesthesia was administered along the course of the saphenous vein using ultrasound guidance. Protective laser glasses were placed on the patient, and the laser was fired at 15 watt pulsed mode advancing 1-2 mm per sec.  For a total of 1947 joules.  A steri strip was applied to the puncture site.  The patient was then put into Trendelenburg position.  Local anesthetic was utilized overlying the marked varicosities.  Greater than 20 stab wounds were made using the tip of an 11 blade; and using the vein hook,  The phlebectomies were performed using a hemostat to avulse these  varicosities.  Adequate hemostasis was achieved, and steri strips were applied to the stab wound.     ABD pads and thigh high compression stockings were applied.  Ace wrap bandages were applied over the phlebectomy sites and at the top of the saphenofemoral junction.  Blood loss was less than 15 cc.  The patient ambulated out of the operating room having tolerated the procedure well.

## 2011-10-11 ENCOUNTER — Telehealth: Payer: Self-pay | Admitting: *Deleted

## 2011-10-11 ENCOUNTER — Encounter: Payer: Self-pay | Admitting: Vascular Surgery

## 2011-10-11 NOTE — Telephone Encounter (Signed)
Reached patient at her home post laser ablation and stab phlebs. Patient had a rough night. Complaining of pain at the knee area. Instructed her to unwrap the aces and smooth out the stocking. One small area of bleeding from a stab site noticed. Instructed patient to use ice, take Ibuprofen, and to call if she needs anything else.

## 2011-10-11 NOTE — Telephone Encounter (Signed)
Called in Ativan for her next procedure.

## 2011-10-17 ENCOUNTER — Encounter: Payer: Self-pay | Admitting: Vascular Surgery

## 2011-10-18 ENCOUNTER — Ambulatory Visit (INDEPENDENT_AMBULATORY_CARE_PROVIDER_SITE_OTHER): Payer: BC Managed Care – PPO | Admitting: Vascular Surgery

## 2011-10-18 ENCOUNTER — Encounter (INDEPENDENT_AMBULATORY_CARE_PROVIDER_SITE_OTHER): Payer: BC Managed Care – PPO | Admitting: Vascular Surgery

## 2011-10-18 ENCOUNTER — Encounter: Payer: Self-pay | Admitting: Vascular Surgery

## 2011-10-18 VITALS — BP 120/59 | HR 71 | Resp 16 | Ht 67.0 in | Wt 235.0 lb

## 2011-10-18 DIAGNOSIS — Z48812 Encounter for surgical aftercare following surgery on the circulatory system: Secondary | ICD-10-CM

## 2011-10-18 DIAGNOSIS — I83893 Varicose veins of bilateral lower extremities with other complications: Secondary | ICD-10-CM

## 2011-10-18 DIAGNOSIS — M79609 Pain in unspecified limb: Secondary | ICD-10-CM

## 2011-10-18 NOTE — Progress Notes (Signed)
Subjective:     Patient ID: Alexandra Mata, female   DOB: 03-Jun-1966, 46 y.o.   MRN: [id number]  HPI this 46 year old female had laser ablation of the right great saphenous vein with multiple stab phlebectomy performed one week ago by me. He has been having some mild/moderate discomfort in the right thigh where the laser ablation was performed. The biggest problem is at the flexion crease of the knee which is near the entry site. She's had no pain related to the stab phlebectomy wounds. She's had no change in distal edema. She has been wearing an elastic stocking and taking ibuprofen.  Review of Systems     Objective:   Physical ExamBP 120/59  Pulse 71  Resp 16  Ht 5\' 7"  (1.702 m)  Wt 235 lb (106.595 kg)  BMI 36.81 kg/m2 Right leg with mild discomfort along the course of the great saphenous vein from the saphenofemoral junction to the knee. Stab phlebectomy sites in the anterolateral thigh healing nicely. Chronic 1-2+ edema distally.  Today I ordered a venous duplex exam of the right leg which are reviewed and interpreted. There is no DVT. The right great saphenous vein is totally closed up to the saphenofemoral junction.          Assessment:     Successful well-tolerated laser ablation right great saphenous vein with multiple stab phlebectomy for painful varicosities    Plan:     Return in the next few weeks a similar procedure on the contralateral left leg

## 2011-10-26 NOTE — Procedures (Unsigned)
DUPLEX DEEP VENOUS EXAM - LOWER EXTREMITY  INDICATION:  Right lower extremity varicose veins, pain.  HISTORY:  Edema:  Yes. Trauma/Surgery:  Right endovenous laser ablation of the great saphenous vein on 10/10/2011. Pain:  Yes. PE:  No. Previous DVT:  No. Anticoagulants:  No. Other:  DUPLEX EXAM:               CFV   SFV   PopV  PTV    GSV               R  L  R  L  R  L  R   L  R  L Thrombosis    o  o  o     o     o      + Spontaneous   +  +  +     +     +      o Phasic        +  +  +     +     +      O Augmentation  +  +  +     +     +      o Compressible  +  +  +     +     +      o Competent     +  +  +     +     +  Legend:  + - yes  o - no  p - partial  D - decreased  IMPRESSION: 1. No evidence of deep venous thrombosis is identified in the right     lower extremity, calf veins are poorly visualized due to edema and     vessel depth. 2. Good post ablation results the length of the right great saphenous     vein ablated. 3. Contralateral common femoral vein is patent and compressible.      _____________________________ Quita Skye. Hart [place], M.D.  SH/MEDQ  D:  10/18/2011  T:  10/18/2011  Job:  [id number]

## 2011-11-07 ENCOUNTER — Other Ambulatory Visit: Payer: BC Managed Care – PPO | Admitting: Vascular Surgery

## 2011-11-15 ENCOUNTER — Ambulatory Visit: Payer: BC Managed Care – PPO | Admitting: Vascular Surgery

## 2011-11-18 ENCOUNTER — Encounter: Payer: Self-pay | Admitting: Vascular Surgery

## 2011-11-21 ENCOUNTER — Ambulatory Visit (INDEPENDENT_AMBULATORY_CARE_PROVIDER_SITE_OTHER): Payer: BC Managed Care – PPO | Admitting: Vascular Surgery

## 2011-11-21 ENCOUNTER — Encounter: Payer: Self-pay | Admitting: Vascular Surgery

## 2011-11-21 VITALS — BP 131/84 | HR 94 | Resp 16 | Ht 66.0 in | Wt 225.0 lb

## 2011-11-21 DIAGNOSIS — I83893 Varicose veins of bilateral lower extremities with other complications: Secondary | ICD-10-CM

## 2011-11-21 NOTE — Progress Notes (Signed)
Subjective:     Patient ID: Alexandra Mata, female   DOB: 1966/03/24, 46 y.o.   MRN: [id number]  HPI 9-year-old female had laser ablation of the left great saphenous vein and 10-20 stab phlebectomy of left thigh and lateral calf area done under local tumescent anesthesia today. She tolerated the procedure well. She had a total of 1137 J of energy utilized.  Review of Systems     Objective:   Physical ExamBP 131/84  Pulse 94  Resp 16  Ht 5\' 6"  (1.676 m)  Wt 225 lb (102.059 kg)  BMI 36.32 kg/m2    Assessment:     Well-tolerated laser ablation left great saphenous vein with 10-20 stab phlebectomy for painful varicosities    Plan:     Return on April 22 for venous duplex exam to confirm closure left great saphenous vein

## 2011-11-21 NOTE — Progress Notes (Signed)
Laser Ablation Procedure      Date: 11/21/2011    Alexandra Mata DOB:Nov 18, 1965  Consent signed: Yes  Surgeon:J.D. Hart Rochester  Procedure: Laser Ablation: left Greater Saphenous Vein  BP 131/84  Pulse 94  Resp 16  Ht 5\' 6"  (1.676 m)  Wt 225 lb (102.059 kg)  BMI 36.32 kg/m2  Start time: 9:00   End time: 10:10  Tumescent Anesthesia: 425 cc 0.9% NaCl with 50 cc Lidocaine HCL with 1% Epi and 15 cc 8.4% NaHCO3  Local Anesthesia: 10 cc Lidocaine HCL and NaHCO3 (ratio 2:1)  Pulsed mode: Watts 15 Seconds 1 Pulses:1 Total Pulses:76 Total Energy: 1137 Total Time: 1:15   Stab Phlebectomy: 10-20 Sites: Thigh, Calf and Ankle  Patient tolerated procedure well: Yes  Description of Procedure:  After marking the course of the saphenous vein and the secondary varicosities in the standing position, the patient was placed on the operating table in the supine position, and the left leg was prepped and draped in sterile fashion. Local anesthetic was administered, and under ultrasound guidance the saphenous vein was accessed with a micro needle and guide wire; then the micro puncture sheath was placed. A guide wire was inserted to the saphenofemoral junction, followed by a 5 french sheath.  The position of the sheath and then the laser fiber below the junction was confirmed using the ultrasound and visualization of the aiming beam.  Tumescent anesthesia was administered along the course of the saphenous vein using ultrasound guidance. Protective laser glasses were placed on the patient, and the laser was fired at 15 watt pulsed mode advancing 1-2 mm per sec.  For a total of 1137 joules.  A steri strip was applied to the puncture site.  The patient was then put into Trendelenburg position.  Local anesthetic was utilized overlying the marked varicosities.  Greater than 10-20 stab wounds were made using the tip of an 11 blade; and using the vein hook,  The phlebectomies were performed using a hemostat to avulse  these varicosities.  Adequate hemostasis was achieved, and steri strips were applied to the stab wound.    ABD pads and thigh high compression stockings were applied.  Ace wrap bandages were applied over the phlebectomy sites and at the top of the saphenofemoral junction.  Blood loss was less than 15 cc.  The patient ambulated out of the operating room having tolerated the procedure well.

## 2011-11-22 ENCOUNTER — Telehealth: Payer: Self-pay | Admitting: *Deleted

## 2011-11-22 ENCOUNTER — Encounter: Payer: Self-pay | Admitting: Vascular Surgery

## 2011-11-22 NOTE — Telephone Encounter (Signed)
Reached patient at home. She is doing well with this leg. Not in as much pain as last time. Following all instructions. Reminded her of her fu appt.

## 2011-11-28 ENCOUNTER — Other Ambulatory Visit: Payer: Self-pay | Admitting: *Deleted

## 2011-11-28 DIAGNOSIS — I83893 Varicose veins of bilateral lower extremities with other complications: Secondary | ICD-10-CM

## 2011-12-02 ENCOUNTER — Encounter: Payer: Self-pay | Admitting: Surgery

## 2011-12-05 ENCOUNTER — Ambulatory Visit (INDEPENDENT_AMBULATORY_CARE_PROVIDER_SITE_OTHER): Payer: BC Managed Care – PPO | Admitting: Vascular Surgery

## 2011-12-05 ENCOUNTER — Encounter: Payer: Self-pay | Admitting: Vascular Surgery

## 2011-12-05 ENCOUNTER — Encounter (INDEPENDENT_AMBULATORY_CARE_PROVIDER_SITE_OTHER): Payer: BC Managed Care – PPO | Admitting: *Deleted

## 2011-12-05 VITALS — BP 138/82 | HR 83 | Resp 16 | Ht 67.0 in | Wt 225.0 lb

## 2011-12-05 DIAGNOSIS — I83893 Varicose veins of bilateral lower extremities with other complications: Secondary | ICD-10-CM

## 2011-12-05 NOTE — Progress Notes (Signed)
Subjective:     Patient ID: Alexandra Mata, female   DOB: 1966-07-20, 46 y.o.   MRN: [id number]  HPI this 46 year old female returns 2 weeks post laser ablation of the left great saphenous vein with multiple stab phlebectomy for painful varicosities. She states that the left leg has had minimal discomfort. She has been wearing her elastic compression stocking. Her stab phlebectomy sites have given her no problems. She also studies of the contralateral right leg feels much better than it did prior to her laser procedure.   Review of Systems     Objective:   Physical ExamBP 138/82  Pulse 83  Resp 16  Ht 5\' 7"  (1.702 m)  Wt 225 lb (102.059 kg)  BMI 35.24 kg/m2 General alert and oriented x3 in no apparent stress Left leg with minimal discomfort along the course of the proximal right saphenous spine. Saphenectomy sites are healing adequately. She is chronic 1+ edema distally. 2+ dorsalis pedis pulses palpable     Today I ordered a venous duplex exam of the left leg which I reviewed and interpreted. There is no DVT. The left great saphenous vein is closed from the distal thigh to the saphenofemoral junction. Assessment:     Doing well post bilateral laser ablation greater saphenous veins and bilateral stab phlebectomy for painful varicosities    Plan:     Will return in 2 weeks for sclerotherapy to complete treatment regimen

## 2011-12-13 NOTE — Procedures (Unsigned)
DUPLEX DEEP VENOUS EXAM - LOWER EXTREMITY  INDICATION:  Left leg varicose vein with other complication, post left great saphenous vein laser ablation.  HISTORY:  Edema:  Yes. Trauma/Surgery:  Right great saphenous vein laser ablation on 10/10/2011, left great saphenous vein laser ablation on 11/21/2011. Pain:  Left thigh tenderness. PE:  No. Previous DVT:  No. Anticoagulants: Other:  DUPLEX EXAM:               CFV   SFV   PopV  PTV    GSV               R  L  R  L  R  L  R   L  R  L Thrombosis    o  o     o     o      o     + Spontaneous   +  +     +     +      +     0 Phasic        +  +     +     +      +     0 Augmentation  +  +     +     +      +     0 Compressible  +  +     +     +      +     0 Competent     0  +     +     0      +     0  Legend:  + - yes  o - no  p - partial  D - decreased  IMPRESSION: 1. No evidence of deep vein thrombosis noted in the left lower     extremity. 2. The left great saphenous vein appears totally occluded from the mid     thigh level to near the saphenofemoral junction.  The left distal     thigh great saphenous vein appears patent. 3. Reflux of > 500 milliseconds noted in the left saphenofemoral     junction, popliteal vein, and right common femoral vein.   _____________________________ Quita Skye. Hart [place], M.D.  CH/MEDQ  D:  12/06/2011  T:  12/06/2011  Job:  [id number]

## 2011-12-21 ENCOUNTER — Ambulatory Visit: Payer: BC Managed Care – PPO | Admitting: *Deleted

## 2011-12-27 ENCOUNTER — Encounter: Payer: Self-pay | Admitting: *Deleted

## 2011-12-28 ENCOUNTER — Ambulatory Visit: Payer: BC Managed Care – PPO | Admitting: *Deleted

## 2012-01-19 ENCOUNTER — Telehealth: Payer: Self-pay | Admitting: *Deleted

## 2012-01-19 NOTE — Telephone Encounter (Signed)
Alexandra Mata is s/p endovenous laser ablation left greater saphenous vein and stab phlebectomy 10-20 incisions left leg on 11-21-2011 by Josephina Gip, MD.    On 12-05-2011, at her post laser ablation follow up she reported minimal left leg discomfort.  Alexandra Mata states that soon after her follow up appointment on 12-05-2011, she began experiencing pain in her groin area (left).  She states that the pain in her groin area "feels like a nerve being pinched" and that she is having difficulty sleeping.  She reports no problems with redness or swelling in the left groin area.  She does report tenderness in the left groin where her panty line touches the area.  She states she is taking Ibuprofen prn pain, using heat pad on left groin area, and keeping left leg elevated with no relief from pain.  Encouraged her to use ice compresses to left groin as needed for pain and to attempt wearing thigh high compression hose in addition to using Ibuprofen and elevating left leg. Will have Clementeen Hoof RN  call and check on Alexandra Mata on 01-23-2012.  Alexandra Mata, Alexandra Mata

## 2012-01-24 ENCOUNTER — Telehealth: Payer: Self-pay | Admitting: *Deleted

## 2012-01-24 NOTE — Telephone Encounter (Signed)
Left a message on the patient's answering machine. Asked her to call me and let me know if her pain is resolving.

## 2012-01-27 ENCOUNTER — Encounter: Payer: Self-pay | Admitting: Internal Medicine

## 2012-01-27 ENCOUNTER — Ambulatory Visit (INDEPENDENT_AMBULATORY_CARE_PROVIDER_SITE_OTHER): Payer: BC Managed Care – PPO | Admitting: Internal Medicine

## 2012-01-27 VITALS — BP 118/80 | HR 85 | Temp 98.4°F | Resp 18 | Ht 66.5 in | Wt 238.0 lb

## 2012-01-27 DIAGNOSIS — R609 Edema, unspecified: Secondary | ICD-10-CM

## 2012-01-27 DIAGNOSIS — R600 Localized edema: Secondary | ICD-10-CM

## 2012-01-27 DIAGNOSIS — J329 Chronic sinusitis, unspecified: Secondary | ICD-10-CM

## 2012-01-27 MED ORDER — FUROSEMIDE 20 MG PO TABS: ORAL_TABLET | ORAL | Status: DC

## 2012-01-27 MED ORDER — FLUCONAZOLE 100 MG PO TABS: 100.0000 mg | ORAL_TABLET | Freq: Every day | ORAL | Status: DC

## 2012-01-27 MED ORDER — LEVOFLOXACIN 500 MG PO TABS: 500.0000 mg | ORAL_TABLET | Freq: Every day | ORAL | Status: AC

## 2012-02-02 DIAGNOSIS — R600 Localized edema: Secondary | ICD-10-CM | POA: Insufficient documentation

## 2012-02-02 NOTE — Progress Notes (Signed)
  Subjective:    Patient ID: Alexandra Mata, female    DOB: 10-Sep-1965, 46 y.o.   MRN: [id number]  HPI Pt presents to clinic for evaluation of possible sinusitis. Notes ~2wk h/o "sinus headache" with yellow green nasal drainage. No f/c or teeth pain. Taking zyrtec/flonase without improvement. No other alleviating or exacerbating factors. Also notes intermittent mild le edema without obvious risks for VTE. Requests prn diuretic.  Past Medical History  Diagnosis Date  . History of chicken pox   . Seizures     Pt states she had a couple of seizures in high school of undetermined cause  . Asymptomatic varicose veins   . Leg swelling   . Obesity   . Edema   . Unspecified sinusitis (chronic)   . Anemia 05/17/2011   Past Surgical History  Procedure Date  . Tubal ligation 1991    `    reports that she has never smoked. She has never used smokeless tobacco. She reports that she does not drink alcohol. Her drug history not on file. family history includes Arthritis in her mother; Asthma in her mother; Cancer in her paternal grandfather; Coronary artery disease in her brother; Diabetes in her maternal grandmother, mother, and paternal grandmother; Heart disease in her maternal grandmother; and Hypertension in her father, maternal grandmother, and mother. No Known Allergies   Review of Systems see hpi     Objective:   Physical Exam  Nursing note and vitals reviewed. Constitutional: She appears well-developed and well-nourished. No distress.  HENT:  Head: Normocephalic and atraumatic.  Right Ear: External ear normal.  Left Ear: External ear normal.  Nose: Right sinus exhibits frontal sinus tenderness. Left sinus exhibits frontal sinus tenderness.  Mouth/Throat: Oropharynx is clear and moist. No oropharyngeal exudate.  Eyes: Conjunctivae and EOM are normal. No scleral icterus.  Neck: Neck supple.  Skin: Skin is warm and dry. She is not diaphoretic.       Minimal ST swelling of bilateral  le. No calf swelling, redness, warmth or palpable cords  Psychiatric: She has a normal mood and affect.          Assessment & Plan:

## 2012-02-02 NOTE — Assessment & Plan Note (Signed)
Attempt lasix prn in sparing manner. Followup if no improvement or worsening. \

## 2012-02-02 NOTE — Assessment & Plan Note (Signed)
Attempt levaquin. Followup if no improvement or worsening.  

## 2012-02-09 ENCOUNTER — Telehealth: Payer: Self-pay | Admitting: *Deleted

## 2012-02-09 MED ORDER — LEVOFLOXACIN 500 MG PO TABS: 500.0000 mg | ORAL_TABLET | Freq: Every day | ORAL | Status: DC

## 2012-02-09 NOTE — Telephone Encounter (Signed)
Ok to repeat abx

## 2012-02-09 NOTE — Telephone Encounter (Signed)
Patient called and left a voice message stating she completed her antibiotics last week Friday for a sinus infection and she is still not feeling any better. She is requesting to take another round of medication.

## 2012-02-09 NOTE — Telephone Encounter (Signed)
Call placed to patient at [phone], she was informed of Rx refill to pharmacy. She was advised if no improvement follow up is needed. Patient verbalized understanding and agrees as instructed.

## 2012-02-17 ENCOUNTER — Encounter: Payer: Self-pay | Admitting: Family

## 2012-02-17 ENCOUNTER — Ambulatory Visit (HOSPITAL_BASED_OUTPATIENT_CLINIC_OR_DEPARTMENT_OTHER)
Admission: RE | Admit: 2012-02-17 | Discharge: 2012-02-17 | Disposition: A | Discharge status: Home / Self Care | Payer: BC Managed Care – PPO | Source: Ambulatory Visit | Attending: Family | Admitting: Family

## 2012-02-17 ENCOUNTER — Ambulatory Visit (INDEPENDENT_AMBULATORY_CARE_PROVIDER_SITE_OTHER): Payer: BC Managed Care – PPO | Admitting: Family

## 2012-02-17 VITALS — BP 118/84 | HR 87 | Temp 97.9°F | Resp 16 | Ht 66.5 in | Wt 237.1 lb

## 2012-02-17 DIAGNOSIS — H538 Other visual disturbances: Secondary | ICD-10-CM | POA: Insufficient documentation

## 2012-02-17 DIAGNOSIS — J329 Chronic sinusitis, unspecified: Secondary | ICD-10-CM

## 2012-02-17 DIAGNOSIS — Z0189 Encounter for other specified special examinations: Secondary | ICD-10-CM

## 2012-02-17 DIAGNOSIS — J3489 Other specified disorders of nose and nasal sinuses: Secondary | ICD-10-CM | POA: Insufficient documentation

## 2012-02-17 DIAGNOSIS — R51 Headache: Secondary | ICD-10-CM | POA: Insufficient documentation

## 2012-02-17 DIAGNOSIS — J029 Acute pharyngitis, unspecified: Secondary | ICD-10-CM

## 2012-02-17 LAB — POCT RAPID STREP A (OFFICE): Rapid Strep A Screen: NEGATIVE

## 2012-02-17 LAB — POCT URINE HCG BY VISUAL COLOR COMPARISON TESTS: Preg Test, Ur: NEGATIVE

## 2012-02-17 MED ORDER — AMOXICILLIN-POT CLAVULANATE 875-125 MG PO TABS: 1.0000 | ORAL_TABLET | Freq: Two times a day (BID) | ORAL | Status: AC

## 2012-02-17 MED ORDER — PREDNISONE 10 MG PO TABS: ORAL_TABLET | ORAL | Status: DC

## 2012-02-17 NOTE — Progress Notes (Signed)
Subjective:    Patient ID: Alexandra Mata, female    DOB: [birth year], 46 y.o.   MRN: [id number]  HPI  Alexandra Mata is a 46 yr old female who presents today to discus ongoing sinus issues.  She was seen initially on 6/14 and treated for sinusitis with a 7 day course of levaquin.  She then was treated with another 7 day course of levaquin on 6/27 due to ongoing sinus symptoms.  She continues claritin D.  Today she reports that when she lays down at night she has a frontal sinus headache.  Continues to have pressure in her cheeks.  + nasal discharge- green/yellow.  Reports + hx tonsil stones.  Sore throat x 1 week.  Rates sore throat 8/10.  Yesterday throat was 10/10.    Review of Systems See HPI  Past Medical History  Diagnosis Date  . History of chicken pox   . Seizures     Pt states she had a couple of seizures in high school of undetermined cause  . Asymptomatic varicose veins   . Leg swelling   . Obesity   . Edema   . Unspecified sinusitis (chronic)   . Anemia 05/17/2011    History   Social History  . Marital Status: Married    Spouse Name: N/A    Number of Children: 6  . Years of Education: N/A   Occupational History  . Not on file.   Social History Main Topics  . Smoking status: Never Smoker   . Smokeless tobacco: Never Used  . Alcohol Use: No  . Drug Use: Not on file  . Sexually Active: Not on file   Other Topics Concern  . Not on file   Social History Narrative   Regular exercise:  NoCaffeine Use:  No3 biological children, 3 stepchildren1990  [place]- one child aged 21991- [id number]- Daughter [name] (three children ages 68, 5 and 6)Etoh- deniesNever smoked.Denies drug useAsh[employer]- working on her bachelors in Early Childhood Education.Married to Annye English    Past Surgical History  Procedure Date  . Tubal ligation 1991    `    Family History  Problem Relation Age of Onset  . Arthritis Mother   . Hypertension Mother   . Diabetes Mother   .  Asthma Mother   . Hypertension Father   . Diabetes Maternal Grandmother   . Hypertension Maternal Grandmother   . Heart disease Maternal Grandmother   . Diabetes Paternal Grandmother   . Cancer Paternal Grandfather     lung  . Coronary artery disease Brother     No Known Allergies  Current Outpatient Prescriptions on File Prior to Visit  Medication Sig Dispense Refill  . B Complex Vitamins (B-COMPLEX/B-12) TABS Take 2 tablets by mouth daily.        . furosemide (LASIX) 20 MG tablet One by mouth in the morning as needed for swelling  30 tablet  0  . Multiple Vitamins-Minerals (MULTIVITAMIN WITH MINERALS) tablet 1 tablet.        . cetirizine (ZYRTEC) 10 MG tablet Take 10 mg by mouth daily.      . fluticasone (FLONASE) 50 MCG/ACT nasal spray Place 2 sprays into the nose daily.  16 g  6  . levofloxacin (LEVAQUIN) 500 MG tablet Take 1 tablet (500 mg total) by mouth daily.  7 tablet  0  . zinc gluconate 50 MG tablet Take 2 tablets daily        BP 118/84  Pulse  87  Temp 97.9 F (36.6 C) (Oral)  Resp 16  Ht 5' 6.5" (1.689 m)  Wt 237 lb 1.9 oz (107.557 kg)  BMI 37.70 kg/m2  SpO2 97%  LMP 01/19/2012       Objective:   Physical Exam  Constitutional: She appears well-developed and well-nourished. No distress.  HENT:  Right Ear: Tympanic membrane and ear canal normal.  Left Ear: Tympanic membrane and ear canal normal.  Mouth/Throat: No oropharyngeal exudate, posterior oropharyngeal edema or posterior oropharyngeal erythema.       + frontal and maxillary tenderness to palpation.   Cardiovascular: Normal rate and regular rhythm.   No murmur heard. Pulmonary/Chest: Effort normal and breath sounds normal. No respiratory distress. She has no wheezes. She has no rales. She exhibits no tenderness.  Musculoskeletal: She exhibits no edema.  Lymphadenopathy:    She has no cervical adenopathy.  Psychiatric: She has a normal mood and affect. Her behavior is normal. Judgment and thought  content normal.          Assessment & Plan:

## 2012-02-17 NOTE — Assessment & Plan Note (Addendum)
Unchanged.  Will plan to rx with augmentin x 7 days, pred taper. Continue loratadine d. Obtain CT scan of sinus.  Refer to ENT.

## 2012-02-17 NOTE — Patient Instructions (Addendum)
Please complete your CT on the first floor.  You will be contact about your referral to ENT.  Please let us know if you have not heard back within 1 week about your referral. Call if symptoms worsen, or if no improvement in next few days.

## 2012-02-18 ENCOUNTER — Other Ambulatory Visit (HOSPITAL_BASED_OUTPATIENT_CLINIC_OR_DEPARTMENT_OTHER): Payer: BC Managed Care – PPO

## 2012-02-21 ENCOUNTER — Telehealth: Payer: Self-pay | Admitting: Family

## 2012-02-21 NOTE — Telephone Encounter (Signed)
Spoke to pt.  Reviewed CT scan. She reports sinus drainage is better but throat is still sore. Now has "blisters on my tonsils." She has apt on 7/16 with ENT.  I advised her to keep this upcoming apt. Call if increased pain, fever, or inability to swallow food drink. Continue ibuprofen or tylenol for pain.  Pt verbalizes understanding.

## 2012-04-10 ENCOUNTER — Telehealth: Payer: Self-pay | Admitting: *Deleted

## 2012-04-10 NOTE — Telephone Encounter (Signed)
Received concealed handgun permit. Form completed/signed. Notified pt of completion and she requested form be mailed to her home address. Form mailed and copy sent for scanning.

## 2012-04-18 ENCOUNTER — Other Ambulatory Visit: Payer: Self-pay | Admitting: *Deleted

## 2012-04-18 DIAGNOSIS — I83893 Varicose veins of bilateral lower extremities with other complications: Secondary | ICD-10-CM

## 2012-04-30 ENCOUNTER — Encounter: Payer: Self-pay | Admitting: Vascular Surgery

## 2012-05-01 ENCOUNTER — Ambulatory Visit (INDEPENDENT_AMBULATORY_CARE_PROVIDER_SITE_OTHER): Payer: BC Managed Care – PPO | Admitting: Vascular Surgery

## 2012-05-01 ENCOUNTER — Encounter: Payer: Self-pay | Admitting: Vascular Surgery

## 2012-05-01 ENCOUNTER — Encounter (INDEPENDENT_AMBULATORY_CARE_PROVIDER_SITE_OTHER): Payer: BC Managed Care – PPO | Admitting: *Deleted

## 2012-05-01 VITALS — BP 124/81 | HR 87 | Resp 16 | Ht 67.0 in | Wt 230.0 lb

## 2012-05-01 DIAGNOSIS — I83893 Varicose veins of bilateral lower extremities with other complications: Secondary | ICD-10-CM

## 2012-05-01 DIAGNOSIS — Z48812 Encounter for surgical aftercare following surgery on the circulatory system: Secondary | ICD-10-CM

## 2012-05-01 NOTE — Progress Notes (Signed)
Subjective:     Patient ID: Alexandra Mata, female   DOB: 07-25-1966, 46 y.o.   MRN: [id number]  HPI this 46 year old female returns because of recurrent pain in the left leg. She had laser ablation of the left and right great saphenous veins performed in April of 2013. She has chronic lymphedema and had venous hypertension as well with chronic edema and pain in both legs. She had a good result on the right leg. She initially had a good result on the left leg but then developed a sharp pain on the anterolateral aspect of the left thigh which occurs on a daily basis and has become worse with time. She does not have worsening edema of the left leg. He has no history of DVT or thrombophlebitis.  Past Medical History  Diagnosis Date  . History of chicken pox   . Seizures     Pt states she had a couple of seizures in high school of undetermined cause  . Asymptomatic varicose veins   . Leg swelling   . Obesity   . Edema   . Unspecified sinusitis (chronic)   . Anemia 05/17/2011    History  Substance Use Topics  . Smoking status: Never Smoker   . Smokeless tobacco: Never Used  . Alcohol Use: No    Family History  Problem Relation Age of Onset  . Arthritis Mother   . Hypertension Mother   . Diabetes Mother   . Asthma Mother   . Hypertension Father   . Diabetes Maternal Grandmother   . Hypertension Maternal Grandmother   . Heart disease Maternal Grandmother   . Diabetes Paternal Grandmother   . Cancer Paternal Grandfather     lung  . Coronary artery disease Brother     No Known Allergies  Current outpatient prescriptions:Loratadine-Pseudoephedrine (LORATADINE-D 24HR PO), Take 1 tablet by mouth daily., Disp: , Rfl: ;  Multiple Vitamins-Minerals (MULTIVITAMIN WITH MINERALS) tablet, 1 tablet.  , Disp: , Rfl: ;  B Complex Vitamins (B-COMPLEX/B-12) TABS, Take 2 tablets by mouth daily.  , Disp: , Rfl: ;  cetirizine (ZYRTEC) 10 MG tablet, Take 10 mg by mouth daily., Disp: , Rfl:  fluticasone  (FLONASE) 50 MCG/ACT nasal spray, Place 2 sprays into the nose daily., Disp: 16 g, Rfl: 6;  furosemide (LASIX) 20 MG tablet, One by mouth in the morning as needed for swelling, Disp: 30 tablet, Rfl: 0;  predniSONE (DELTASONE) 10 MG tablet, 4 tabs once daily x 2 days, then 3 tabs once daily x 2 days, then 2 tabs once daily x 2 days, then 1 tab once daily x 2 days then stop., Disp: 20 tablet, Rfl: 0 zinc gluconate 50 MG tablet, Take 2 tablets daily, Disp: , Rfl:   BP 124/81  Pulse 87  Resp 16  Ht 5\' 7"  (1.702 m)  Wt 230 lb (104.327 kg)  BMI 36.02 kg/m2  Body mass index is 36.02 kg/(m^2).           Review of Systems denies chest pain, dyspnea on exertion, PND, orthopnea, chronic back pain to     Objective:   Physical Exam blood pressure 124/81 heart rate 87 respirations 16 General obese relation female in no apparent distress alert and oriented x3 Lungs no rhonchi or wheezing Left lower extremity with no tenderness along the course of the great saphenous vein. She does have chronic lymphedema with typical deformity of the distal calf and ankle. 3+ dorsalis pedis pulses palpable. No bulging varicosities are noted. Pain  that she describes on the anterolateral aspect of the thigh with no abnormalities noted on physical exam.  Data ordered a venous duplex exam the left leg which are reviewed and interpreted majority of the great saphenous vein from the distal thigh to the saphenofemoral junction is totally closed with the exception of one 8 cm segment in the midportion which does have some reflux. It is not a large diameter vein-0.32 cm. There is no DVT or deep reflux which is severe.    Assessment:     Pain anterior thigh 5 months post laser ablation left great saphenous vein Atypical for vascular either arterial or venous pain Successful closure left great saphenous vein with the exception of 8 cm segment-this should not cause pain patient is describing    Plan:     No further  vascular treatment or evaluation is indicated-do not think this is responsible for her pain syndrome Could possibly have compression of sensory nerve and inguinal ligament causing pain in anterolateral aspect of thigh. May need urology consult if she continues to have severe symptoms Return to see [place] on when necessary basis

## 2012-05-28 ENCOUNTER — Ambulatory Visit (INDEPENDENT_AMBULATORY_CARE_PROVIDER_SITE_OTHER): Payer: BC Managed Care – PPO | Admitting: Family

## 2012-05-28 DIAGNOSIS — Z23 Encounter for immunization: Secondary | ICD-10-CM

## 2012-05-28 DIAGNOSIS — Z0289 Encounter for other administrative examinations: Secondary | ICD-10-CM

## 2012-06-06 ENCOUNTER — Encounter: Payer: Self-pay | Admitting: Family

## 2012-06-06 ENCOUNTER — Ambulatory Visit (INDEPENDENT_AMBULATORY_CARE_PROVIDER_SITE_OTHER): Payer: BC Managed Care – PPO | Admitting: Family

## 2012-06-06 VITALS — BP 116/80 | HR 93 | Temp 97.9°F | Resp 16 | Wt 242.1 lb

## 2012-06-06 DIAGNOSIS — I8390 Asymptomatic varicose veins of unspecified lower extremity: Secondary | ICD-10-CM

## 2012-06-06 DIAGNOSIS — M79606 Pain in leg, unspecified: Secondary | ICD-10-CM | POA: Insufficient documentation

## 2012-06-06 DIAGNOSIS — M79609 Pain in unspecified limb: Secondary | ICD-10-CM

## 2012-06-06 NOTE — Patient Instructions (Addendum)
You will be contact about your referral.  Please let us know if you have not heard back within 1 week about your referral.  

## 2012-06-06 NOTE — Assessment & Plan Note (Signed)
Pt wishes to have consultation at [facility] for further evaluation. Will refer.  In the meantime, I have advised her to try using compression hose.

## 2012-06-06 NOTE — Progress Notes (Signed)
Subjective:    Patient ID: Alexandra Mata, female    DOB: Dec 14, 1965, 46 y.o.   MRN: [id number]  HPI  Ms.  Mata is a 46 yr old female who presents today with chief complaint of left medial anterior thigh pain.  Pt reports that the pain started immediately following her vascular procedure in April of this year.  At that time she underwent laser ablation of the left great saphenous vein with 10-20 stab phlebectomy for painful varicosities with Dr. Josephina Gip. Pain is worsened by activity such as walking up the stairs. She describes the pain as throbbing in nature and constant.  She was seen in follow up on 9/17 and underwent a LLE Duplex which noted an 8cm length of patent vein in the mid-thigh which appears to reflux.  She is tearful today and says that the pain is impacting her quality of life and is keeping her from being active.  She denies associated back pain or leg weakness.   Review of Systems See HPI  Past Medical History  Diagnosis Date  . History of chicken pox   . Seizures     Pt states she had a couple of seizures in high school of undetermined cause  . Asymptomatic varicose veins   . Leg swelling   . Obesity   . Edema   . Unspecified sinusitis (chronic)   . Anemia 05/17/2011    History   Social History  . Marital Status: Married    Spouse Name: N/A    Number of Children: 6  . Years of Education: N/A   Occupational History  . Not on file.   Social History Main Topics  . Smoking status: Never Smoker   . Smokeless tobacco: Never Used  . Alcohol Use: No  . Drug Use: Not on file  . Sexually Active: Not on file   Other Topics Concern  . Not on file   Social History Narrative   Regular exercise:  NoCaffeine Use:  No3 biological children, 3 stepchildren1990  [place]- one child aged 21991- [id number]- Daughter [name] (three children ages 75, 5 and 6)Etoh- deniesNever smoked.Denies drug useAshford University- working on her bachelors in Early Childhood  Education.Married to Annye English    Past Surgical History  Procedure Date  . Tubal ligation 1991    `    Family History  Problem Relation Age of Onset  . Arthritis Mother   . Hypertension Mother   . Diabetes Mother   . Asthma Mother   . Hypertension Father   . Diabetes Maternal Grandmother   . Hypertension Maternal Grandmother   . Heart disease Maternal Grandmother   . Diabetes Paternal Grandmother   . Cancer Paternal Grandfather     lung  . Coronary artery disease Brother     No Known Allergies  Current Outpatient Prescriptions on File Prior to Visit  Medication Sig Dispense Refill  . B Complex Vitamins (B-COMPLEX/B-12) TABS Take 2 tablets by mouth daily.        . cetirizine (ZYRTEC) 10 MG tablet Take 10 mg by mouth daily.      . fluticasone (FLONASE) 50 MCG/ACT nasal spray Place 2 sprays into the nose daily.  16 g  6  . Loratadine-Pseudoephedrine (LORATADINE-D 24HR PO) Take 1 tablet by mouth daily.      . Multiple Vitamins-Minerals (MULTIVITAMIN WITH MINERALS) tablet 1 tablet.        . zinc gluconate 50 MG tablet Take 2 tablets daily      .  furosemide (LASIX) 20 MG tablet One by mouth in the morning as needed for swelling  30 tablet  0  . predniSONE (DELTASONE) 10 MG tablet 4 tabs once daily x 2 days, then 3 tabs once daily x 2 days, then 2 tabs once daily x 2 days, then 1 tab once daily x 2 days then stop.  20 tablet  0    BP 116/80  Pulse 93  Temp 97.9 F (36.6 C) (Oral)  Resp 16  Wt 242 lb 1.9 oz (109.825 kg)  SpO2 99%       Objective:   Physical Exam  Constitutional: She appears well-developed and well-nourished. No distress.  Cardiovascular: Normal rate and regular rhythm.   No murmur heard. Pulmonary/Chest: Effort normal and breath sounds normal. No respiratory distress. She has no wheezes. She has no rales. She exhibits no tenderness.  Musculoskeletal: She exhibits no edema.  Psychiatric: She has a normal mood and affect. Her behavior is normal.  Judgment and thought content normal.          Assessment & Plan:

## 2012-06-19 ENCOUNTER — Encounter: Payer: Self-pay | Admitting: Family

## 2012-08-06 ENCOUNTER — Telehealth: Payer: Self-pay | Admitting: *Deleted

## 2012-08-06 NOTE — Telephone Encounter (Signed)
Received message from pt stating her grandbaby was just born prematurely and she is wanting to know of her last tdap vaccine. Left detailed message on pt's voicemail that tdap is up to date and to call if any further question.

## 2012-09-17 ENCOUNTER — Encounter: Payer: Self-pay | Admitting: Internal Medicine

## 2012-09-17 ENCOUNTER — Ambulatory Visit (INDEPENDENT_AMBULATORY_CARE_PROVIDER_SITE_OTHER): Payer: BC Managed Care – PPO | Admitting: Internal Medicine

## 2012-09-17 VITALS — BP 120/84 | HR 85 | Temp 98.1°F | Wt 241.0 lb

## 2012-09-17 DIAGNOSIS — J069 Acute upper respiratory infection, unspecified: Secondary | ICD-10-CM

## 2012-09-17 MED ORDER — HYDROCODONE-HOMATROPINE 5-1.5 MG/5ML PO SYRP: 5.0000 mL | ORAL_SOLUTION | Freq: Every evening | ORAL | Status: DC | PRN

## 2012-09-17 MED ORDER — AZITHROMYCIN 250 MG PO TABS: ORAL_TABLET | ORAL | Status: DC

## 2012-09-17 NOTE — Progress Notes (Signed)
  Subjective:    Patient ID: Alexandra Mata, female    DOB: [birth year], 47 y.o.   MRN: [id number]  HPI Acute visit Developed a cold a days ago: Sore throat, runny nose, gradually getting worse. For the last 2 days has not been able to sleep due to a persisting cough with a mild sputum production. She had sinus pressure for the first 2 days but that is largely gone.  Past Medical History  Diagnosis Date  . History of chicken pox   . Seizures     Pt states she had a couple of seizures in high school of undetermined cause  . Asymptomatic varicose veins   . Leg swelling   . Obesity   . Edema   . Unspecified sinusitis (chronic)   . Anemia 05/17/2011   Past Surgical History  Procedure Date  . Tubal ligation 1991    `     Review of Systems No fever, some chills. No nausea, vomiting, diarrhea. No myalgias. Some "tightness" in the chest and chest soreness with cough. Denies asthma or tobacco abuse. No other family members affected except her husband which is started with symptoms yesterday.    Objective:   Physical Exam General -- alert, well-developed  HEENT -- TMs normal, throat w/o redness, face symmetric and not tender to palpation ; nose quite congested, voice is nasal Lungs -- normal respiratory effort, no intercostal retractions, no accessory muscle use, and normal breath sounds.   Heart-- normal rate, regular rhythm, no murmur, and no gallop.   Extremities-- no pretibial edema bilaterally Psych-- Cognition and judgment appear intact. Alert and cooperative with normal attention span and concentration.  not anxious appearing and not depressed appearing.      Assessment & Plan:   URI, symptoms consistent with URI, she has a persisting cough but lung exam is normal. Will treat with Mucinex DM and hydrocodone as needed, if not better we will start a Z-Pak. See instructions.

## 2012-09-17 NOTE — Patient Instructions (Addendum)
Rest, fluids , tylenol For cough, take Mucinex DM twice a day as needed  If you cough persistently at night, take hydrocodone as needed, will make you sleepy Take the antibiotic as prescribed  (zpack) only if not improving in the next 2 or 3 days Call if no better in few days Call anytime if the symptoms are severe

## 2012-12-20 ENCOUNTER — Telehealth: Payer: Self-pay | Admitting: Family

## 2012-12-20 NOTE — Telephone Encounter (Signed)
Pls call pt and let her know that I received letter from [facility] breast imaging that she is due for 6 month follow up breast imaging and they have not heard back from her.  She should contact them to arrange.

## 2012-12-20 NOTE — Telephone Encounter (Signed)
See below

## 2012-12-21 NOTE — Telephone Encounter (Signed)
Patient advised.

## 2013-01-04 ENCOUNTER — Encounter: Payer: Self-pay | Admitting: Family

## 2013-03-19 ENCOUNTER — Encounter: Payer: Self-pay | Admitting: Family

## 2013-03-19 ENCOUNTER — Ambulatory Visit (INDEPENDENT_AMBULATORY_CARE_PROVIDER_SITE_OTHER): Payer: BC Managed Care – PPO | Admitting: Family

## 2013-03-19 VITALS — BP 110/80 | HR 71 | Temp 98.0°F | Resp 18

## 2013-03-19 DIAGNOSIS — J329 Chronic sinusitis, unspecified: Secondary | ICD-10-CM

## 2013-03-19 MED ORDER — AMOXICILLIN-POT CLAVULANATE 875-125 MG PO TABS: 1.0000 | ORAL_TABLET | Freq: Two times a day (BID) | ORAL | Status: DC

## 2013-03-19 NOTE — Patient Instructions (Addendum)

## 2013-03-19 NOTE — Assessment & Plan Note (Signed)
New, will rx with augmentin.

## 2013-03-19 NOTE — Progress Notes (Signed)
Subjective:    Patient ID: Alexandra Mata, female    DOB: [birth year], 47 y.o.   MRN: [id number]  HPI  Alexandra Mata is a 47 year old female who presents today with chief complaint of headache. Reports it feels like a brick in her forehead.  She has tried sudafed a vaporizer, vaporub.  Started with a runny nose about  Month ago with a runny nose then worsened.  She denies fever but does report associated fatigue.    Review of Systems See HPI  Past Medical History  Diagnosis Date  . History of chicken pox   . Seizures     Pt states she had a couple of seizures in high school of undetermined cause  . Asymptomatic varicose veins   . Leg swelling   . Obesity   . Edema   . Unspecified sinusitis (chronic)   . Anemia 05/17/2011    History   Social History  . Marital Status: Married    Spouse Name: N/A    Number of Children: 6  . Years of Education: N/A   Occupational History  . Not on file.   Social History Main Topics  . Smoking status: Never Smoker   . Smokeless tobacco: Never Used  . Alcohol Use: No  . Drug Use: Not on file  . Sexually Active: Not on file   Other Topics Concern  . Not on file   Social History Narrative   Regular exercise:  No   Caffeine Use:  No   3 biological children, 3 stepchildren      72  [place]- one child aged 2   75- Alexandra Mata   52- Alexandra Mata (three children ages 6, 66 and 58)      60- denies   Never smoked.   Denies drug use      Alexandra Mata- working on her bachelors in Early Childhood Education.      Married to Alexandra Mata    Past Surgical History  Procedure Laterality Date  . Tubal ligation  1991    `    Family History  Problem Relation Age of Onset  . Arthritis Mother   . Hypertension Mother   . Diabetes Mother   . Asthma Mother   . Hypertension Father   . Diabetes Maternal Grandmother   . Hypertension Maternal Grandmother   . Heart disease Maternal Grandmother   . Diabetes Paternal Grandmother   .  Cancer Paternal Grandfather     lung  . Coronary artery disease Brother     No Known Allergies  No current outpatient prescriptions on file prior to visit.   No current facility-administered medications on file prior to visit.    BP 110/80  Pulse 71  Temp(Src) 98 F (36.7 C) (Oral)  Resp 18  SpO2 98%       Objective:   Physical Exam  Constitutional: She is oriented to person, place, and time. She appears well-developed and well-nourished. No distress.  HENT:  + frontal >Maxillary sinus tenderness to palpation.    Cardiovascular: Normal rate and regular rhythm.   No murmur heard. Pulmonary/Chest: Effort normal and breath sounds normal. No respiratory distress. She has no wheezes. She has no rales. She exhibits no tenderness.  Neurological: She is alert and oriented to person, place, and time.  Psychiatric: She has a normal mood and affect. Her behavior is normal. Judgment and thought content normal.          Assessment & Plan:

## 2013-04-03 ENCOUNTER — Encounter: Payer: Self-pay | Admitting: Physician Assistant

## 2013-04-03 ENCOUNTER — Ambulatory Visit (INDEPENDENT_AMBULATORY_CARE_PROVIDER_SITE_OTHER): Payer: BC Managed Care – PPO | Admitting: Physician Assistant

## 2013-04-03 ENCOUNTER — Ambulatory Visit (HOSPITAL_BASED_OUTPATIENT_CLINIC_OR_DEPARTMENT_OTHER)
Admission: RE | Admit: 2013-04-03 | Discharge: 2013-04-03 | Disposition: A | Discharge status: Home / Self Care | Payer: BC Managed Care – PPO | Source: Ambulatory Visit | Attending: Physician Assistant | Admitting: Physician Assistant

## 2013-04-03 VITALS — BP 128/78 | HR 84 | Temp 98.4°F | Resp 16 | Wt 241.8 lb

## 2013-04-03 DIAGNOSIS — M79609 Pain in unspecified limb: Secondary | ICD-10-CM | POA: Insufficient documentation

## 2013-04-03 DIAGNOSIS — I82401 Acute embolism and thrombosis of unspecified deep veins of right lower extremity: Secondary | ICD-10-CM

## 2013-04-03 DIAGNOSIS — M7989 Other specified soft tissue disorders: Secondary | ICD-10-CM

## 2013-04-03 DIAGNOSIS — R0989 Other specified symptoms and signs involving the circulatory and respiratory systems: Secondary | ICD-10-CM | POA: Insufficient documentation

## 2013-04-03 DIAGNOSIS — I82409 Acute embolism and thrombosis of unspecified deep veins of unspecified lower extremity: Secondary | ICD-10-CM

## 2013-04-03 NOTE — Patient Instructions (Signed)
Please go get Ultrasound of your R leg.  Please do not leave imaging until they have contacted me with the results.  If you develop chest pain or shortness of breath, please go directly to emergency room.   Deep Vein Thrombosis A deep vein thrombosis (DVT) is a blood clot that develops in a deep vein. A DVT is a clot in the deep, larger veins of the leg, arm, or pelvis. These are more dangerous than clots that might form in veins near the surface of the body. A DVT can lead to complications if the clot breaks off and travels in the bloodstream to the lungs.  A DVT can damage the valves in your leg veins, so that instead of flowing upwards, the blood pools in the lower leg. This is called post-thrombotic syndrome, and can result in pain, swelling, discoloration, and sores on the leg. Once identified, a DVT can be treated. It can also be prevented in some circumstances. Once you have had a DVT, you may be at increased risk for a DVT in the future. CAUSES Blood clots form in a vein for different reasons. Usually several things contribute to blood clots. Contributing factors include:  The flow of blood slows down.  The inside of the vein is damaged in some way.  The person has a condition that makes blood clot more easily. Some people are more likely than others to develop blood clots. That is because they have more factors that make clots likely. These are called risk factors. Risk factors include:   Older age, especially over 73 years old.  Having a history of blood clots. This means you have had one before. Or, it means that someone else in your family has had blood clots. You may have a genetic tendency to form clots.  Having major or lengthy surgery. This is especially true for surgery on the hip, knee, or belly (abdomen). Hip surgery is particularly high risk.  Breaking a hip or leg.  Sitting or lying still for a long time. This includes long distance travel, paralysis, or recovery from an  illness or surgery.  Cancer, or cancer treatment.  Having a long, thin tube (catheter) placed inside a vein during a medical procedure.  Being overweight (obese).  Pregnancy and childbirth. Hormone changes make the blood clot more easily during pregnancy. The fetus puts pressure on the veins of the pelvis. There is also risk of injury to veins during delivery or a caesarean. The risk is at its highest just after childbirth.  Medicines with the female hormone estrogen. This includes birth control pills and hormone replacement therapy.  Smoking.  Other circulation or heart problems. SYMPTOMS When a clot forms, it can either partially or totally block the blood flow in that vein. Symptoms of a DVT can include:  Swelling of the leg or arm, especially if one side is much worse.  Warmth and redness of the leg or arm, especially if one side is much worse.  Pain in an arm or leg. If the clot is in the leg, symptoms may be more noticeable or worse when standing or walking. The symptoms of a DVT that has traveled to the lungs (pulmonary embolism, PE) usually start suddenly, and include:  Shortness of breath.  Coughing.  Coughing up blood or blood-tinged phlegm.  Chest pain. The chest pain is often worse with deep breaths.  Rapid heartbeat. Anyone with these symptoms should get emergency medical treatment right away. Call your local emergency services (911  in [place] if you have these symptoms. DIAGNOSIS If a DVT is suspected, your caregiver will take a full medical history and carry out a physical exam. Tests that also may be required include:  Blood tests, including studies of the clotting properties of the blood.  Ultrasonography to see if you have clots in your legs or lungs.  X-rays to show the flow of blood when dye is injected into the veins (venography).  Studies of your lungs, if you have any chest symptoms. PREVENTION  Exercise the legs regularly. Take a brisk 30 minute  walk every day.  Maintain a weight that is appropriate for your height.  Avoid sitting or lying in bed for long periods of time without moving your legs.  Women, particularly those over the age of 31, should consider the risks and benefits of taking estrogen medicines, including birth control pills.  Do not smoke, especially if you take estrogen medicines.  Long distance travel can increase your risk of DVT. You should exercise your legs by walking or pumping the muscles every hour.  In-hospital prevention:  Many of the risk factors above relate to situations that exist with hospitalization, either for illness, injury, or elective surgery.  Your caregiver will assess you for the need for venous thromboembolism prophylaxis when you are admitted to the hospital. If you are having surgery, your surgeon will assess you the day of or day after surgery.  Prevention may include medical and nonmedical measures. TREATMENT Treatment for DVT helps prevent death and disability. The most common treatment for DVT is blood thinning (anticoagulant) medicine, which reduces the blood's tendency to clot. Anticoagulants can stop new blood clots from forming and old ones from growing. They cannot dissolve existing clots. Your body does this by itself over time. Anticoagulants can be given by mouth, by intravenous (IV) access, or by injection. Your caregiver will determine the best program for you.  Heparin or related medicines (low molecular weight heparin) are usually the first treatment for a blood clot. They act quickly. However, they cannot be taken orally.  Heparin can cause a fall in a component of blood that stops bleeding and forms blood clots (platelets). You will be monitored with blood tests to be sure this does not occur.  Warfarin is an anticoagulant that can be swallowed (taken orally). It takes a few days to start working, so usually heparin or related medicines are used in combination. Once  warfarin is working, heparin is usually stopped.  Less commonly, clot dissolving drugs (thrombolytics) are used to dissolve a DVT. They carry a high risk of bleeding, so they are used mainly in severe cases, where a life or limb is threatened.  Very rarely, a blood clot in the leg needs to be removed surgically.  If you are unable to take anticoagulants, your caregiver may arrange for you to have a filter placed in a main vein in your belly (abdomen). This filter prevents clots from traveling to your lungs. HOME CARE INSTRUCTIONS  Take all medicines prescribed by your caregiver. Follow the directions carefully.  Warfarin. Most people will continue taking warfarin after hospital discharge. Your caregiver will advise you on the length of treatment (usually 3 6 months, sometimes lifelong).  Too much and too little warfarin are both dangerous. Too much warfarin increases the risk of bleeding. Too little warfarin continues to allow the risk for blood clots. While taking warfarin, you will need to have regular blood tests to measure your blood clotting time. These blood  tests usually include both the prothrombin time (PT) and international normalized ratio (INR) tests. The PT and INR results allow your caregiver to adjust your dose of warfarin. The dose can change for many reasons. It is critically important that you take warfarin exactly as prescribed, and that you have your PT and INR levels drawn exactly as directed.  Many foods, especially foods high in vitamin K can interfere with warfarin and affect the PT and INR results. Foods high in vitamin K include spinach, kale, broccoli, cabbage, collard and turnip greens, brussels sprouts, peas, cauliflower, seaweed, and parsley as well as beef and pork liver, green tea, and soybean oil. You should eat a consistent amount of foods high in vitamin K. Avoid major changes in your diet, or notify your caregiver before changing your diet. Arrange a visit with a  dietitian to answer your questions.  Many medicines can interfere with warfarin and affect the PT and INR results. You must tell your caregiver about any and all medicines you take, this includes all vitamins and supplements. Be especially cautious with aspirin and anti-inflammatory medicines. Ask your caregiver before taking these. Do not take or discontinue any prescribed or over-the-counter medicine except on the advice of your caregiver or pharmacist.  Warfarin can have side effects, primarily excessive bruising or bleeding. You will need to hold pressure over cuts for longer than usual. Your caregiver or pharmacist will discuss other potential side effects.  Alcohol can change the body's ability to handle warfarin. It is best to avoid alcoholic drinks or consume only very small amounts while taking warfarin. Notify your caregiver if you change your alcohol intake.  Notify your dentist or other caregivers before procedures.  Activity. Ask your caregiver how soon you can go back to normal activities. It is important to stay active to prevent blood clots. If you are on anticoagulant medicine, avoid contact sports.  Exercise. It is very important to exercise. This is especially important while traveling, sitting or standing for long periods of time. Exercise your legs by walking or by pumping the muscles frequently. Take frequent walks.  Compression stockings. These are tight elastic stockings that apply pressure to the lower legs. This pressure can help keep the blood in the legs from clotting. You may need to wear compressions stockings at home to help prevent a DVT.  Smoking. If you smoke, quit. Ask your caregiver for help with quitting smoking.  Learn as much as you can about DVT. Knowing more about the condition should help you keep it from coming back.  Wear a medical alert bracelet or carry a medical alert card. SEEK MEDICAL CARE IF:  You notice a rapid heartbeat.  You feel weaker or  more tired than usual.  You feel faint.  You notice increased bruising.  You feel your symptoms are not getting better in the time expected.  You believe you are having side effects of medicine. SEEK IMMEDIATE MEDICAL CARE IF:  You have chest pain.  You have trouble breathing.  You have new or increased swelling or pain in one leg.  You cough up blood.  You notice blood in vomit, in a bowel movement, or in urine. MAKE SURE YOU:  Understand these instructions.  Will watch your condition.  Will get help right away if you are not doing well or get worse. Document Released: 08/01/2005 Document Revised: 04/25/2012 Document Reviewed: 09/23/2010 [facility] Patient Information 2014 [place], [place].

## 2013-04-03 NOTE — Progress Notes (Signed)
Patient ID: Alexandra Mata, female   DOB: 1966-02-06, 47 y.o.   MRN: [id number]  Patient presents to clinic today c/o pain and swelling of her right calf that has been persistent over the past week.  Information was obtained from the patient.  Patient states that she noticed her left lower leg has been extremely swollen and has noted worsening pain in her calf.  Has chronic lymphedema and therefore legs are always swollen, but she noticed its hard to slide her pants over the R lower leg.  Denies chest pain or shortness of breath.  Denies recent surgery.  Denies history of DVT or PE.  Patient is a non-smoker.  Patient endorses a 4 1/2 hour airplane ride at the end of last week.  States flight was delayed so she was sitting still for 5-6+ hours.    Past Medical History  Diagnosis Date  . History of chicken pox   . Seizures     Pt states she had a couple of seizures in high school of undetermined cause  . Asymptomatic varicose veins   . Leg swelling   . Obesity   . Edema   . Unspecified sinusitis (chronic)   . Anemia 05/17/2011    No current outpatient prescriptions on file prior to visit.   No current facility-administered medications on file prior to visit.    No Known Allergies  Family History  Problem Relation Age of Onset  . Arthritis Mother   . Hypertension Mother   . Diabetes Mother   . Asthma Mother   . Hypertension Father   . Diabetes Maternal Grandmother   . Hypertension Maternal Grandmother   . Heart disease Maternal Grandmother   . Diabetes Paternal Grandmother   . Cancer Paternal Grandfather     lung  . Coronary artery disease Brother     History   Social History  . Marital Status: Married    Spouse Name: N/A    Number of Children: 6  . Years of Education: N/A   Social History Main Topics  . Smoking status: Never Smoker   . Smokeless tobacco: Never Used  . Alcohol Use: No  . Drug Use: None  . Sexual Activity: None   Other Topics Concern  . None    Social History Narrative   Regular exercise:  No   Caffeine Use:  No   3 biological children, 3 stepchildren      41  [place]- one child aged 2   46- Tresa Endo   14- Daughter [name] (three children ages 42, 61 and 4)      36- denies   Never smoked.   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      Married to [employer]   Review of Systems  Constitutional: Negative for fever, chills, weight loss and malaise/fatigue.  Respiratory: Negative for cough, hemoptysis, sputum production, shortness of breath and wheezing.   Cardiovascular: Positive for leg swelling. Negative for chest pain and palpitations.  Gastrointestinal: Negative for abdominal pain.  Musculoskeletal: Positive for back pain.  Neurological: Negative for loss of consciousness.   Filed Vitals:   04/03/13 1608  BP: 128/78  Pulse: 84  Temp: 98.4 F (36.9 C)  Resp: 16    Physical Exam  Vitals reviewed. Constitutional: She is oriented to person, place, and time and well-developed, well-nourished, and in no distress.  HENT:  Head: Normocephalic and atraumatic.  Eyes: Pupils are equal,  round, and reactive to light.  Cardiovascular: Normal rate, regular rhythm, normal heart sounds and intact distal pulses.   Pulmonary/Chest: Effort normal and breath sounds normal. No respiratory distress. She has no wheezes. She has no rales. She exhibits no tenderness.  Musculoskeletal:  Legs swollen bilaterally 2/2 chronic lymphedema.  RLE with more pronounced swelling.  + calf pain with palpation.  + Homan's sign of RLE.  Neurological: She is alert and oriented to person, place, and time.  Skin: Skin is warm and dry. No rash noted. No erythema.    Labs: No results found for this or any previous visit (from the past 2160 hour(s)).  Assessment/Plan: Suspected DVT (deep vein thrombosis) Stat Venous US or RLE.  Imaging given my cell phone number to call me if after  hours.  Will triage/treat patient accordingly.

## 2013-04-03 NOTE — Assessment & Plan Note (Addendum)
O2 sats good.  Stat Venous US or RLE.  Imaging given my cell phone number to call me if after hours.  Will triage/treat patient accordingly.

## 2013-04-18 ENCOUNTER — Telehealth: Payer: Self-pay | Admitting: *Deleted

## 2013-04-18 NOTE — Telephone Encounter (Signed)
Received message from pt that she saw the PA 2 weeks ago for right foot and leg swelling. U/S was negative for DVT. Pt states swelling and pain continues and now has some numbness in her foot as well. States pain is constant and keeps her up at night. Wants to know if she should have an MRI or what is the next step?

## 2013-04-19 NOTE — Telephone Encounter (Signed)
Notified pt and scheduled appt for 04/22/13 at 3:15pm.

## 2013-04-19 NOTE — Telephone Encounter (Signed)
I would like to see her back in the office please and we can determine next step.

## 2013-04-22 ENCOUNTER — Encounter: Payer: Self-pay | Admitting: Family

## 2013-04-22 ENCOUNTER — Ambulatory Visit (INDEPENDENT_AMBULATORY_CARE_PROVIDER_SITE_OTHER): Payer: BC Managed Care – PPO | Admitting: Family

## 2013-04-22 VITALS — BP 110/80 | HR 68 | Temp 97.7°F | Resp 16 | Wt 241.1 lb

## 2013-04-22 DIAGNOSIS — N390 Urinary tract infection, site not specified: Secondary | ICD-10-CM

## 2013-04-22 DIAGNOSIS — M545 Low back pain, unspecified: Secondary | ICD-10-CM

## 2013-04-22 DIAGNOSIS — R300 Dysuria: Secondary | ICD-10-CM

## 2013-04-22 DIAGNOSIS — Z0189 Encounter for other specified special examinations: Secondary | ICD-10-CM

## 2013-04-22 LAB — POCT URINALYSIS DIPSTICK
Glucose, UA: NEGATIVE
Nitrite, UA: NEGATIVE
Urobilinogen, UA: 0.2
pH, UA: 6.5

## 2013-04-22 MED ORDER — METHYLPREDNISOLONE 4 MG PO KIT: PACK | ORAL | Status: DC

## 2013-04-22 MED ORDER — CIPROFLOXACIN HCL 500 MG PO TABS: 500.0000 mg | ORAL_TABLET | Freq: Two times a day (BID) | ORAL | Status: DC

## 2013-04-22 NOTE — Patient Instructions (Signed)
Please start medrol dose pak. You will be contacted about your MRI to further evaluate your low back pain. Please follow up in 1 month.

## 2013-04-22 NOTE — Progress Notes (Signed)
  Subjective:    Patient ID: Alexandra Mata, female    DOB: 01/22/1966, 47 y.o.   MRN: [id number]  HPI  Alexandra Mata is a 47 yr old female who presents today with chief complaint of back pain. Subjective:    Alexandra Mata is a 47 y.o. female who presents for evaluation of low back pain. The patient has had recurrent self limited episodes of low back pain in the past and previous hx or some mild back pain. Symptoms have been present for 4 months and are gradually worsening.  Onset was related to / precipitated by no known injury. The pain is located in the right lumbar area, left lumbar area or radiating to bilateral leg(s) and radiates to the right lower leg, left lower leg. The pain is described as stabbing and occurs all day. Pain is rated 6/10. Symptoms are exacerbated by standing, ADL's such as grocery shopping. Symptoms are improved by nothing. She has also tried nothing which provided no symptom relief. She has weakness in the right leg greater thaan the left associated with the back pain.   She also reports burning with urination.  This has been present x 2 days. Review of Systems Pertinent items are noted in HPI.    Objective:     Plan:    Review of Systems     Objective:   Physical Exam  Constitutional: She is oriented to person, place, and time. She appears well-developed and well-nourished. No distress.  HENT:  Head: Normocephalic and atraumatic.  Cardiovascular: Normal rate and regular rhythm.   No murmur heard. Pulmonary/Chest: Effort normal and breath sounds normal. No respiratory distress. She has no wheezes. She has no rales. She exhibits no tenderness.  Musculoskeletal: She exhibits no edema.       Thoracic back: She exhibits no tenderness.       Lumbar back: She exhibits no tenderness.  Neurological: She is alert and oriented to person, place, and time.  RLE strength is 4-5/5 LLE strength 5/5 Steady even gait  Psychiatric: She has a normal mood and affect. Her  behavior is normal. Judgment and thought content normal.          Assessment & Plan:

## 2013-04-22 NOTE — Assessment & Plan Note (Signed)
Pt with dysuria, ua suggestive of UTI. Will rx with cipro and send urine for culture.

## 2013-04-22 NOTE — Assessment & Plan Note (Signed)
Suspect lumbar disc disease. Obtain MRI of the lumbar spine.  Rx with medrol dose pak.  Further recommendations pending review of MRI.

## 2013-04-24 LAB — URINE CULTURE

## 2013-04-25 ENCOUNTER — Telehealth: Payer: Self-pay | Admitting: *Deleted

## 2013-04-25 NOTE — Telephone Encounter (Signed)
Pt states she has MRI of lumbar spine scheduled for Saturday. Has had a lot of neck pain and right shoulder pain today. Wants to know if we can order MRI of cervical spine to be done at the same time?  Please advise.

## 2013-04-26 NOTE — Telephone Encounter (Signed)
Lets start with the MRI of the lumbar spine only for now.  It would be difficult to get insurance to cover both at this time.  She should continue the medrol dose pak. I am hopeful this will also help with her neck symptoms.  If symptoms in the neck persist we can certainly try to get insurance approval for the neck at a later date.

## 2013-04-26 NOTE — Telephone Encounter (Signed)
Notified pt and she voices understanding. 

## 2013-04-27 ENCOUNTER — Ambulatory Visit (HOSPITAL_BASED_OUTPATIENT_CLINIC_OR_DEPARTMENT_OTHER)
Admission: RE | Admit: 2013-04-27 | Discharge: 2013-04-27 | Disposition: A | Discharge status: Home / Self Care | Payer: BC Managed Care – PPO | Source: Ambulatory Visit | Attending: Family | Admitting: Family

## 2013-04-27 DIAGNOSIS — R209 Unspecified disturbances of skin sensation: Secondary | ICD-10-CM | POA: Insufficient documentation

## 2013-04-27 DIAGNOSIS — M545 Low back pain, unspecified: Secondary | ICD-10-CM

## 2013-04-27 DIAGNOSIS — M5380 Other specified dorsopathies, site unspecified: Secondary | ICD-10-CM | POA: Insufficient documentation

## 2013-04-27 DIAGNOSIS — M5126 Other intervertebral disc displacement, lumbar region: Secondary | ICD-10-CM | POA: Insufficient documentation

## 2013-04-29 ENCOUNTER — Encounter: Payer: Self-pay | Admitting: Family

## 2013-04-29 ENCOUNTER — Ambulatory Visit (INDEPENDENT_AMBULATORY_CARE_PROVIDER_SITE_OTHER): Payer: BC Managed Care – PPO | Admitting: Family

## 2013-04-29 VITALS — BP 126/84 | HR 78 | Temp 98.3°F | Resp 16 | Wt 244.0 lb

## 2013-04-29 DIAGNOSIS — M545 Low back pain, unspecified: Secondary | ICD-10-CM

## 2013-04-29 DIAGNOSIS — N390 Urinary tract infection, site not specified: Secondary | ICD-10-CM

## 2013-04-29 DIAGNOSIS — J029 Acute pharyngitis, unspecified: Secondary | ICD-10-CM

## 2013-04-29 DIAGNOSIS — R300 Dysuria: Secondary | ICD-10-CM

## 2013-04-29 NOTE — Progress Notes (Signed)
Subjective:    Patient ID: Alexandra Mata, female    DOB: February 05, 1966, 47 y.o.   MRN: [id number]  HPI  Alexandra Mata is a 47 yr old female who presents today with chief complaint of sore throat. Reports that she woke up with aching "all over."  Reports that it is "hard to swallow." Noted blister on the right tonsil.   Low back pain- last week she presented with low back pain/radiculopathy. She was prescribed a medrol dose pak.  She reports that on Saturday and Sunday she started to develop pain in the left leg.  She reports significant improvement with the medrol dose pak.  MRI was performed and notes:  IMPRESSION:  1. Disk bulging at L2-3 and L3-4 without significant stenosis.  2. Shallow right paracentral disc protrusion and annular tear at  L4-5 with potential contact of the traversing right L5 nerve root.  3. Chronic loss of disc height and osteophyte formation at L5-S1  with mild foraminal narrowing bilaterally.    UTI- last visit she reported urinary discomfort. She was treated with cipro. Urine culture noted 20K multiple morphotypes.  Reports that she still has some dysuria. No fever.   She completed cipro rx.     Review of Systems    see HPI  Past Medical History  Diagnosis Date  . History of chicken pox   . Seizures     Pt states she had a couple of seizures in high school of undetermined cause  . Asymptomatic varicose veins   . Leg swelling   . Obesity   . Edema   . Unspecified sinusitis (chronic)   . Anemia 05/17/2011    History   Social History  . Marital Status: Married    Spouse Name: N/A    Number of Children: 6  . Years of Education: N/A   Occupational History  . Not on file.   Social History Main Topics  . Smoking status: Never Smoker   . Smokeless tobacco: Never Used  . Alcohol Use: No  . Drug Use: Not on file  . Sexual Activity: Not on file   Other Topics Concern  . Not on file   Social History Narrative   Regular exercise:  No   Caffeine Use:   No   3 biological children, 3 stepchildren      [age]  [place]- one child aged 2   14- Alexandra Mata   22- [name] (three children ages 62, 26 and 67)      51- denies   Never smoked.   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      Married to Alexandra Mata    Past Surgical History  Procedure Laterality Date  . Tubal ligation  1991    `    Family History  Problem Relation Age of Onset  . Arthritis Mother   . Hypertension Mother   . Diabetes Mother   . Asthma Mother   . Hypertension Father   . Diabetes Maternal Grandmother   . Hypertension Maternal Grandmother   . Heart disease Maternal Grandmother   . Diabetes Paternal Grandmother   . Cancer Paternal Grandfather     lung  . Coronary artery disease Brother     No Known Allergies  Current Outpatient Prescriptions on File Prior to Visit  Medication Sig Dispense Refill  . ibuprofen (ADVIL,MOTRIN) 200 MG tablet Take 200 mg by mouth every 6 (six) hours  as needed for pain.       No current facility-administered medications on file prior to visit.    BP 126/84  Pulse 78  Temp(Src) 98.3 F (36.8 C) (Oral)  Resp 16  Wt 244 lb (110.678 kg)  BMI 38.21 kg/m2  SpO2 99%  LMP 04/29/2013    Objective:   Physical Exam  Constitutional: She is oriented to person, place, and time. She appears well-developed and well-nourished. No distress.  HENT:  Head: Normocephalic.  Right Ear: Tympanic membrane and ear canal normal.  Left Ear: Tympanic membrane and ear canal normal.  Mouth/Throat: Posterior oropharyngeal erythema present. No oropharyngeal exudate or posterior oropharyngeal edema.  + shallow white ulcer noted on right tonsil  Cardiovascular: Normal rate and regular rhythm.   No murmur heard. Pulmonary/Chest: Effort normal and breath sounds normal. No respiratory distress. She has no wheezes. She has no rales. She exhibits no tenderness.  Lymphadenopathy:     She has no cervical adenopathy.  Neurological: She is alert and oriented to person, place, and time.  Skin: Skin is warm and dry.  Psychiatric: She has a normal mood and affect. Her behavior is normal. Judgment and thought content normal.          Assessment & Plan:

## 2013-04-29 NOTE — Patient Instructions (Addendum)
You will be contacted about your referral to Dr. Danielle Dess (neurosurgery).  Please let us know if you have not heard back within 1 week about your referral. You may use motrin as needed for sore throat and cepacol lozenges.

## 2013-04-29 NOTE — Assessment & Plan Note (Signed)
Persistent symptoms.  Has menses today. Unable to provide sample. She will return in AM with sample for repeat culture.

## 2013-04-29 NOTE — Assessment & Plan Note (Signed)
Clinically improved after steroids.  Will refer to neurosurgery.

## 2013-04-29 NOTE — Assessment & Plan Note (Signed)
Rapid strep neg.  Will send DNA probe.  Suspect viral etiology. Plan supportive measures as outlined in AVS.

## 2013-04-30 LAB — STREP A DNA PROBE: GASP: NEGATIVE

## 2013-06-13 ENCOUNTER — Telehealth: Payer: Self-pay | Admitting: Family

## 2013-06-13 NOTE — Telephone Encounter (Signed)
Spoke with patient and informed her that provider is out of office until Monday & that she should be contacted about appointment for Neurosurgeon/Neurology that PCP placed at last OV [09.15.14] RE: her back for further management of issues; pt states that her appt is not until next month and was just wanting to know if provider would go ahead & order MRI d/t level of pain. Explained that message would be sent to provider but no guarantee that we will have response before return to office on Monday, 11.03.14 but if received, I will call her with provider response; pt understood/SLS Please Advise.

## 2013-06-13 NOTE — Telephone Encounter (Signed)
Patient states that she would like Alexandra Mata to order an MRI of her upper back and neck. She says that she still still having pain in her upper back and neck and the numbness is going into both arms and hands.

## 2013-06-15 NOTE — Telephone Encounter (Signed)
i can see her back in office for exam prior to ordering mri neck. Need to document exam findings for neck to support MRI for insurance purposes so we can hopefully get it covered.

## 2013-06-17 NOTE — Telephone Encounter (Signed)
Advised pt of below instructions and she voices understanding. States she has appt with [place] tomorrow for a possible UTI and will discuss MRI at that time.

## 2013-06-18 ENCOUNTER — Ambulatory Visit (INDEPENDENT_AMBULATORY_CARE_PROVIDER_SITE_OTHER): Payer: BC Managed Care – PPO | Admitting: Family

## 2013-06-18 ENCOUNTER — Encounter: Payer: Self-pay | Admitting: Family

## 2013-06-18 VITALS — BP 130/78 | HR 78 | Temp 98.0°F | Resp 16 | Wt 240.0 lb

## 2013-06-18 DIAGNOSIS — N390 Urinary tract infection, site not specified: Secondary | ICD-10-CM | POA: Insufficient documentation

## 2013-06-18 DIAGNOSIS — M542 Cervicalgia: Secondary | ICD-10-CM

## 2013-06-18 LAB — POCT URINALYSIS DIPSTICK
Bilirubin, UA: NEGATIVE
Nitrite, UA: NEGATIVE
Protein, UA: NEGATIVE
Urobilinogen, UA: 0.2
pH, UA: 6

## 2013-06-18 MED ORDER — CIPROFLOXACIN HCL 500 MG PO TABS: 500.0000 mg | ORAL_TABLET | Freq: Two times a day (BID) | ORAL | Status: DC

## 2013-06-18 MED ORDER — CYCLOBENZAPRINE HCL 5 MG PO TABS: 5.0000 mg | ORAL_TABLET | Freq: Every evening | ORAL | Status: DC | PRN

## 2013-06-18 MED ORDER — METHYLPREDNISOLONE 4 MG PO KIT: PACK | ORAL | Status: DC

## 2013-06-18 NOTE — Assessment & Plan Note (Signed)
Deteriorated.  Will rx with medrol dose pack, obtain MRI of the C spine, keep upcoming apt with Dr. Danielle Dess. HS flexeril prn.

## 2013-06-18 NOTE — Assessment & Plan Note (Signed)
Will rx with cipro, send urine for culture.

## 2013-06-18 NOTE — Progress Notes (Signed)
Subjective:    Patient ID: Alexandra Mata, female    DOB: 05/06/1966, 47 y.o.   MRN: [id number]  HPI  Alexandra Mata is a 47 yr old female who presents today with two concerns:  1) Dysuria- Notes dysuria x 5 days. She is noted on UA to have small leukocytes. Denies associated low back pain. Noted some spotting on Friday after wiping.  Was not time for her menses.    2) Neck pain- She is scheduled to meet with Dr. Danielle Dess- neurosurgery re: her low back pain. Her lumbar MRI noted multilevel DDD with shallow right paracentral disc protrusion and annular tear at  L4-5 with potential contact of the traversing right L5 nerve root. She reports that the pain in her right let is now also affecting her left leg.  Neck pain has worsened.  Notes RUE weakness.  Has some associated numbness of the right hand. Having trouble with ADL's such as vacuuming. She reports that she has been unable to help her husband with their businesses.   Having trouble sleeping due to pain.      Review of Systems See HPi  Past Medical History  Diagnosis Date  . History of chicken pox   . Seizures     Pt states she had a couple of seizures in high school of undetermined cause  . Asymptomatic varicose veins   . Leg swelling   . Obesity   . Edema   . Unspecified sinusitis (chronic)   . Anemia 05/17/2011    History   Social History  . Marital Status: Married    Spouse Name: N/A    Number of Children: 6  . Years of Education: N/A   Occupational History  . Not on file.   Social History Main Topics  . Smoking status: Never Smoker   . Smokeless tobacco: Never Used  . Alcohol Use: No  . Drug Use: Not on file  . Sexual Activity: Not on file   Other Topics Concern  . Not on file   Social History Narrative   Regular exercise:  No   Caffeine Use:  No   3 biological children, 3 stepchildren      1  [place]- one child aged 2   34- Tresa Endo   50- Daughter [name] (three children ages 53, 51 and 28)      87-  denies   Never smoked.   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      Married to Annye English    Past Surgical History  Procedure Laterality Date  . Tubal ligation  1991    `    Family History  Problem Relation Age of Onset  . Arthritis Mother   . Hypertension Mother   . Diabetes Mother   . Asthma Mother   . Hypertension Father   . Diabetes Maternal Grandmother   . Hypertension Maternal Grandmother   . Heart disease Maternal Grandmother   . Diabetes Paternal Grandmother   . Cancer Paternal Grandfather     lung  . Coronary artery disease Brother     No Known Allergies  Current Outpatient Prescriptions on File Prior to Visit  Medication Sig Dispense Refill  . ibuprofen (ADVIL,MOTRIN) 200 MG tablet Take 200 mg by mouth every 6 (six) hours as needed.        No current facility-administered medications on file prior to visit.    BP 130/78  Pulse  78  Temp(Src) 98 F (36.7 C) (Oral)  Resp 16  Wt 240 lb 0.6 oz (108.881 kg)  SpO2 99%       Objective:   Physical Exam  Constitutional: She is oriented to person, place, and time. She appears well-developed and well-nourished. No distress.  HENT:  Head: Normocephalic and atraumatic.  Cardiovascular: Normal rate and regular rhythm.   No murmur heard. Pulmonary/Chest: Effort normal and breath sounds normal. No respiratory distress. She has no wheezes. She has no rales. She exhibits no tenderness.  Musculoskeletal: She exhibits no edema.  + firm muscle spasm noted base of right trapezius  Lymphadenopathy:    She has no cervical adenopathy.  Neurological: She is alert and oriented to person, place, and time.  Reflex Scores:      Patellar reflexes are 1+ on the right side and 1+ on the left side. Right hand grasp and RUE strength is 4-5/5. Bilateral LE strength is 5/5   Skin: Skin is warm and dry.  Psychiatric: She has a normal mood and affect. Her behavior is  normal. Judgment and thought content normal.          Assessment & Plan:

## 2013-06-18 NOTE — Patient Instructions (Addendum)
Cipro has been sent to your pharmacy for UTI. Flexeril and medrol dose pak have been sent for neck/back pain.  Your will be contacted about your MRI. Please keep upcoming appointment with Dr. Danielle Dess. Follow up in 6 months, sooner if problems/concerns.

## 2013-06-18 NOTE — Addendum Note (Signed)
Addended by: Mervin Kung A on: 06/18/2013 04:45 PM   Modules accepted: Orders

## 2013-06-20 ENCOUNTER — Telehealth: Payer: Self-pay | Admitting: Family

## 2013-06-20 LAB — URINE CULTURE: Colony Count: 70000

## 2013-06-20 MED ORDER — CEFUROXIME AXETIL 500 MG PO TABS: 500.0000 mg | ORAL_TABLET | Freq: Two times a day (BID) | ORAL | Status: DC

## 2013-06-20 NOTE — Telephone Encounter (Signed)
Pt informed

## 2013-06-20 NOTE — Telephone Encounter (Signed)
Pls call pt and advise her that urine culture is resistant to cipro. Stop cipro, start ceftin instead.

## 2013-06-22 ENCOUNTER — Ambulatory Visit (HOSPITAL_BASED_OUTPATIENT_CLINIC_OR_DEPARTMENT_OTHER): Payer: BC Managed Care – PPO

## 2013-06-22 ENCOUNTER — Ambulatory Visit (HOSPITAL_BASED_OUTPATIENT_CLINIC_OR_DEPARTMENT_OTHER)
Admission: RE | Admit: 2013-06-22 | Discharge: 2013-06-22 | Disposition: A | Discharge status: Home / Self Care | Payer: BC Managed Care – PPO | Source: Ambulatory Visit | Attending: Family | Admitting: Family

## 2013-06-22 DIAGNOSIS — M4802 Spinal stenosis, cervical region: Secondary | ICD-10-CM | POA: Insufficient documentation

## 2013-06-22 DIAGNOSIS — M5030 Other cervical disc degeneration, unspecified cervical region: Secondary | ICD-10-CM | POA: Insufficient documentation

## 2013-06-22 DIAGNOSIS — M542 Cervicalgia: Secondary | ICD-10-CM

## 2013-06-22 DIAGNOSIS — M79609 Pain in unspecified limb: Secondary | ICD-10-CM | POA: Insufficient documentation

## 2013-06-22 DIAGNOSIS — M5020 Other cervical disc displacement, unspecified cervical region: Secondary | ICD-10-CM | POA: Insufficient documentation

## 2013-06-22 DIAGNOSIS — M47812 Spondylosis without myelopathy or radiculopathy, cervical region: Secondary | ICD-10-CM | POA: Insufficient documentation

## 2013-07-10 ENCOUNTER — Other Ambulatory Visit: Payer: Self-pay | Admitting: Family

## 2013-07-10 NOTE — Telephone Encounter (Signed)
Please advise:  Medication name:  Name from pharmacy:  cyclobenzaprine (FLEXERIL) 5 MG tablet  CYCLOBENZAPRINE 5 MG TABLET 5 MG TAB Sig: TAKE 1 TABLET BY MOUTH AT BEDTIME AS NEEDED FOR MUSCLE SPASMS. Dispense: 30 tablet Refills: 0 Start: 07/10/2013 Class: Normal Requested on: 06/18/2013 Originally ordered on: 06/18/2013 Last refill: 06/18/2013

## 2013-07-22 ENCOUNTER — Ambulatory Visit (INDEPENDENT_AMBULATORY_CARE_PROVIDER_SITE_OTHER): Payer: BC Managed Care – PPO | Admitting: Family

## 2013-07-22 ENCOUNTER — Encounter: Payer: Self-pay | Admitting: Family

## 2013-07-22 VITALS — BP 138/84 | HR 80 | Temp 98.5°F | Resp 16 | Ht 67.0 in | Wt 242.0 lb

## 2013-07-22 DIAGNOSIS — Z23 Encounter for immunization: Secondary | ICD-10-CM

## 2013-07-22 DIAGNOSIS — N951 Menopausal and female climacteric states: Secondary | ICD-10-CM

## 2013-07-22 DIAGNOSIS — R232 Flushing: Secondary | ICD-10-CM

## 2013-07-22 MED ORDER — ESCITALOPRAM OXALATE 10 MG PO TABS: 10.0000 mg | ORAL_TABLET | Freq: Every day | ORAL | Status: DC

## 2013-07-22 NOTE — Progress Notes (Signed)
Subjective:    Patient ID: Alexandra Mata, female    DOB: 07-24-1966, 47 y.o.   MRN: [id number]  HPI  Alexandra Mata is a 47 yr old female who presents today with chief complaint of hot flashes. Reports every 30 minutes she develops hot flashes.  Hot flashes wake her up at night.  Has not had a period in 2 years.   Denies family history of breast cancer.  Father has factor 5 leiden and has hx of clot.  She doe not know her factor 5 leiden status.     Review of Systems See HPI  Past Medical History  Diagnosis Date  . History of chicken pox   . Seizures     Pt states she had a couple of seizures in high school of undetermined cause  . Asymptomatic varicose veins   . Leg swelling   . Obesity   . Edema   . Unspecified sinusitis (chronic)   . Anemia 05/17/2011    History   Social History  . Marital Status: Married    Spouse Name: N/A    Number of Children: 6  . Years of Education: N/A   Occupational History  . Not on file.   Social History Main Topics  . Smoking status: Never Smoker   . Smokeless tobacco: Never Used  . Alcohol Use: No  . Drug Use: Not on file  . Sexual Activity: Not on file   Other Topics Concern  . Not on file   Social History Narrative   Regular exercise:  No   Caffeine Use:  No   3 biological children, 3 stepchildren      59  [place]- one child aged 2   63- Alexandra Mata   60- Alexandra Mata (three children ages 40, 41 and 50)      32- denies   Never smoked.   Denies drug use      Alexandra Mata- working on her bachelors in Early Childhood Education.      Married to Alexandra Mata    Past Surgical History  Procedure Laterality Date  . Tubal ligation  1991    `    Family History  Problem Relation Age of Onset  . Arthritis Mother   . Hypertension Mother   . Diabetes Mother   . Asthma Mother   . Hypertension Father   . Diabetes Maternal Grandmother   . Hypertension Maternal Grandmother   . Heart disease Maternal Grandmother     . Diabetes Paternal Grandmother   . Cancer Paternal Grandfather     lung  . Coronary artery disease Brother     No Known Allergies  Current Outpatient Prescriptions on File Prior to Visit  Medication Sig Dispense Refill  . cyclobenzaprine (FLEXERIL) 5 MG tablet TAKE 1 TABLET BY MOUTH AT BEDTIME AS NEEDED FOR MUSCLE SPASMS.  30 tablet  0  . ibuprofen (ADVIL,MOTRIN) 200 MG tablet Take 200 mg by mouth every 6 (six) hours as needed.        No current facility-administered medications on file prior to visit.    BP 138/84  Pulse 80  Temp(Src) 98.5 F (36.9 C) (Oral)  Resp 16  Ht 5\' 7"  (1.702 m)  Wt 242 lb (109.77 kg)  BMI 37.89 kg/m2  LMP 04/15/2013       Objective:   Physical Exam  Constitutional: She is oriented to person, place, and time. She appears well-developed and well-nourished. No distress.  Cardiovascular: Normal rate and regular rhythm.  No murmur heard. Pulmonary/Chest: Effort normal and breath sounds normal.  Musculoskeletal: She exhibits no edema.  Neurological: She is alert and oriented to person, place, and time.  Psychiatric: She has a normal mood and affect. Her behavior is normal. Judgment and thought content normal.          Assessment & Plan:

## 2013-07-22 NOTE — Patient Instructions (Signed)
Please start lexapro once daily.  Follow up in 6 weeks.

## 2013-07-23 ENCOUNTER — Encounter: Payer: Self-pay | Admitting: Family

## 2013-07-24 ENCOUNTER — Telehealth: Payer: Self-pay | Admitting: Family

## 2013-07-24 NOTE — Telephone Encounter (Signed)
Please call pt and advise her she is overdue for important follow up breast imaging and should contact [facility] to schedule: [phone]

## 2013-07-24 NOTE — Telephone Encounter (Signed)
Notifed pt and she voices understanding.  Requested recent tsh result.  ADvised pt of normal result.  Please advise if any further instructions.

## 2013-07-25 DIAGNOSIS — N951 Menopausal and female climacteric states: Secondary | ICD-10-CM | POA: Insufficient documentation

## 2013-07-25 NOTE — Assessment & Plan Note (Addendum)
I advised her against HRT due to family hx of factor 5 leiden and potential increased risk of clot/stroke on HRT.  TSH is normal.  Trial of lexapro 10 mg.  I instructed pt to start 1/2 tablet once daily for 1 week and then increase to a full tablet once daily on week two as tolerated.  We discussed common side effects such as nausea, drowsiness and weight gain.  Also discussed rare but serious side effect of suicide ideation.  She is instructed to discontinue medication go directly to ED if this occurs.  Pt verbalizes understanding.

## 2013-07-25 NOTE — Telephone Encounter (Signed)
Noted. No further instructions. 

## 2013-08-14 ENCOUNTER — Other Ambulatory Visit: Payer: Self-pay | Admitting: Physician Assistant

## 2013-08-14 DIAGNOSIS — M542 Cervicalgia: Secondary | ICD-10-CM

## 2013-08-14 NOTE — Telephone Encounter (Signed)
Refill granted with no additional refills.

## 2013-08-14 NOTE — Telephone Encounter (Signed)
Refill request for Flexeril Last filled by MD on -07/22/2013 #30 x0 Last Appt: 07/22/2013 Next Appt: none Please advise refill?

## 2013-09-09 ENCOUNTER — Ambulatory Visit (INDEPENDENT_AMBULATORY_CARE_PROVIDER_SITE_OTHER): Payer: BC Managed Care – PPO | Admitting: Family

## 2013-09-09 ENCOUNTER — Encounter: Payer: Self-pay | Admitting: Family

## 2013-09-09 VITALS — BP 114/80 | HR 90 | Temp 98.0°F | Resp 16 | Ht 67.0 in | Wt 245.0 lb

## 2013-09-09 DIAGNOSIS — J329 Chronic sinusitis, unspecified: Secondary | ICD-10-CM

## 2013-09-09 MED ORDER — AMOXICILLIN-POT CLAVULANATE 875-125 MG PO TABS: 1.0000 | ORAL_TABLET | Freq: Two times a day (BID) | ORAL | Status: DC

## 2013-09-09 MED ORDER — FLUTICASONE PROPIONATE 50 MCG/ACT NA SUSP: 2.0000 | Freq: Every day | NASAL | Status: DC

## 2013-09-09 NOTE — Assessment & Plan Note (Signed)
Start Augmentin and Flonase. Follow up in four days if symptoms do not improve.

## 2013-09-09 NOTE — Patient Instructions (Addendum)
Start Augmentin. Take one tablet twice daily for 10 days. Use Flonase as needed for nasal congestion. Follow up if symptoms do not improve in four days.

## 2013-09-09 NOTE — Progress Notes (Signed)
Pre visit review using our clinic review tool, if applicable. No additional management support is needed unless otherwise documented below in the visit note. 

## 2013-09-09 NOTE — Progress Notes (Signed)
Subjective:    Patient ID: Alexandra Mata, female    DOB: 12/22/1965, 48 y.o.   MRN: [id number]  Headache  Associated symptoms include ear pain, rhinorrhea and sinus pressure. Pertinent negatives include no coughing, fever, nausea, sore throat or vomiting.   Alexandra Mata is a 48 year old female who presents today with a chief complaint of nasal congestion and sinus drainage x 1 month. Last Friday patient reports she developed a right frontal headache with tenderness to sinus cavity, patient also reporting chills. Denies cough, fever, shortness of breath and sore throat.     Review of Systems  Constitutional: Positive for chills. Negative for fever.  HENT: Positive for ear pain, rhinorrhea and sinus pressure. Negative for sore throat.        Reports pain to bilateral ears with left worse than right.  Eyes: Negative for itching.  Respiratory: Negative for cough and shortness of breath.   Cardiovascular: Negative for chest pain.  Gastrointestinal: Negative for nausea and vomiting.  Neurological: Positive for light-headedness and headaches.   Past Medical History  Diagnosis Date  . History of chicken pox   . Seizures     Pt states she had a couple of seizures in high school of undetermined cause  . Asymptomatic varicose veins   . Leg swelling   . Obesity   . Edema   . Unspecified sinusitis (chronic)   . Anemia 05/17/2011    History   Social History  . Marital Status: Married    Spouse Name: N/A    Number of Children: 6  . Years of Education: N/A   Occupational History  . Not on file.   Social History Main Topics  . Smoking status: Never Smoker   . Smokeless tobacco: Never Used  . Alcohol Use: No  . Drug Use: Not on file  . Sexual Activity: Not on file   Other Topics Concern  . Not on file   Social History Narrative   Regular exercise:  No   Caffeine Use:  No   3 biological children, 3 [place]- one child aged [place]-  Daughter Larene Beach (three children ages 57, 74 and 27)      92- denies   Never smoked.   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      Married to Liborio Nixon    Past Surgical History  Procedure Laterality Date  . Tubal ligation  1991    `    Family History  Problem Relation Age of Onset  . Arthritis Mother   . Hypertension Mother   . Diabetes Mother   . Asthma Mother   . Hypertension Father   . Diabetes Maternal Grandmother   . Hypertension Maternal Grandmother   . Heart disease Maternal Grandmother   . Diabetes Paternal Grandmother   . Cancer Paternal Grandfather     lung  . Coronary artery disease Brother     No Known Allergies  Current Outpatient Prescriptions on File Prior to Visit  Medication Sig Dispense Refill  . cyclobenzaprine (FLEXERIL) 5 MG tablet TAKE 1 TABLET BY MOUTH AT BEDTIME AS NEEDED FOR MUSCLE SPASMS.  30 tablet  0  . escitalopram (LEXAPRO) 10 MG tablet Take 1 tablet (10 mg total) by mouth daily.  30 tablet  2  . ibuprofen (ADVIL,MOTRIN) 200 MG tablet Take 200 mg  by mouth every 6 (six) hours as needed.       . meloxicam (MOBIC) 15 MG tablet Take 15 mg by mouth daily.       No current facility-administered medications on file prior to visit.    BP 114/80  Pulse 90  Temp(Src) 98 F (36.7 C) (Oral)  Resp 16  Ht 5\' 7"  (1.702 m)  Wt 245 lb 0.6 oz (111.149 kg)  BMI 38.37 kg/m2  SpO2 98%       Objective:   Physical Exam  Constitutional: She is oriented to person, place, and time. She appears well-nourished.  HENT:  Head: Normocephalic.  Right Ear: Hearing normal. A middle ear effusion is present.  Left Ear: Hearing normal. There is tenderness.  Nose: Nose normal.  Mouth/Throat: No oropharyngeal exudate.  Eyes: Pupils are equal, round, and reactive to light.  Neck: Neck supple.  Cardiovascular: Normal rate and regular rhythm.   Pulmonary/Chest: Breath sounds normal. No respiratory  distress. She has no wheezes.  Lymphadenopathy:    She has no cervical adenopathy.  Neurological: She is alert and oriented to person, place, and time.  Skin: Skin is warm and dry.  Psychiatric: She has a normal mood and affect.          Assessment & Plan:  I have personally seen and examined patient and agree with Alma Friendly NP student's assessment and plan.

## 2013-09-19 ENCOUNTER — Other Ambulatory Visit: Payer: Self-pay | Admitting: Physician Assistant

## 2013-09-19 NOTE — Telephone Encounter (Signed)
Please advise refill? Last RX was done on 08-14-13 quantity 30 with 0 refills  If ok fax to [phone]

## 2013-09-20 NOTE — Telephone Encounter (Signed)
rx sent

## 2013-10-04 ENCOUNTER — Ambulatory Visit: Payer: BC Managed Care – PPO | Admitting: Family Medicine

## 2013-10-04 ENCOUNTER — Ambulatory Visit (INDEPENDENT_AMBULATORY_CARE_PROVIDER_SITE_OTHER): Payer: BC Managed Care – PPO | Admitting: Physician Assistant

## 2013-10-04 ENCOUNTER — Encounter: Payer: Self-pay | Admitting: Physician Assistant

## 2013-10-04 VITALS — BP 122/80 | HR 72 | Temp 97.8°F | Resp 16 | Wt 248.0 lb

## 2013-10-04 DIAGNOSIS — J019 Acute sinusitis, unspecified: Secondary | ICD-10-CM | POA: Insufficient documentation

## 2013-10-04 MED ORDER — AZITHROMYCIN 250 MG PO TABS: ORAL_TABLET | ORAL | Status: DC

## 2013-10-04 NOTE — Assessment & Plan Note (Signed)
Rx Azithromycin.  IM depo medrol injection given by nursing staff.  Take Mucinex-D as directed.  Continue Flonase.  Continue humidifier in the bedroom.  Use saline nasal spray. Discussed possibility of allergy, obstructive or inflammatory cause of symptoms.  Will need referral to ENT if symptoms do not improve with measures discussed at today's visit.

## 2013-10-04 NOTE — Progress Notes (Signed)
Patient presents to clinic today c/o continued sinus pressure, sinus pain, tooth pain and ear pain bilaterally.  Symptoms have now been present for 6 weeks.  Patient was seen ~ 1 month ago by another provider for sinusitis.  Was given Rx for Augmentin.  Patient endorses taking medication as prescribed. States her symptoms did not improve at all.   States her symptoms have persisted also despite OTC measures such as a humidifier in the bedroom.  Has not taken Mucinex.  Patient does have history of deviated septum.  Denies smoking, recent travel or sick contact.  Patient denies fever and is afebrile in clinic today.  Denies cough, SOB or wheezing.  Past Medical History  Diagnosis Date  . History of chicken pox   . Seizures     Pt states she had a couple of seizures in high school of undetermined cause  . Asymptomatic varicose veins   . Leg swelling   . Obesity   . Edema   . Unspecified sinusitis (chronic)   . Anemia 05/17/2011    Current Outpatient Prescriptions on File Prior to Visit  Medication Sig Dispense Refill  . cyclobenzaprine (FLEXERIL) 5 MG tablet TAKE 1 TABLET BY MOUTH AT BEDTIME AS NEEDED FOR MUSCLE SPASMS.  30 tablet  0  . escitalopram (LEXAPRO) 10 MG tablet Take 1 tablet (10 mg total) by mouth daily.  30 tablet  2  . fluticasone (FLONASE) 50 MCG/ACT nasal spray Place 2 sprays into both nostrils daily.  16 g  0  . ibuprofen (ADVIL,MOTRIN) 200 MG tablet Take 200 mg by mouth every 6 (six) hours as needed.       . meloxicam (MOBIC) 15 MG tablet Take 15 mg by mouth daily.       No current facility-administered medications on file prior to visit.    No Known Allergies  Family History  Problem Relation Age of Onset  . Arthritis Mother   . Hypertension Mother   . Diabetes Mother   . Asthma Mother   . Hypertension Father   . Diabetes Maternal Grandmother   . Hypertension Maternal Grandmother   . Heart disease Maternal Grandmother   . Diabetes Paternal Grandmother   . Cancer  Paternal Grandfather     lung  . Coronary artery disease Brother     History   Social History  . Marital Status: Married    Spouse Name: N/A    Number of Children: 6  . Years of Education: N/A   Social History Main Topics  . Smoking status: Never Smoker   . Smokeless tobacco: Never Used  . Alcohol Use: No  . Drug Use: None  . Sexual Activity: None   Other Topics Concern  . None   Social History Narrative   Regular exercise:  No   Caffeine Use:  No   3 biological children, 3 [place]- one child aged [place]- Daughter Larene Beach (three children ages 25, 59 and 70)      74- denies   Never smoked.   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      Married to [employer]    Review of Systems - See HPI.  All other ROS are negative.  BP 122/80  Pulse 72  Temp(Src) 97.8 F (36.6 C) (Oral)  Resp 16  Wt 248 lb (112.492 kg)  SpO2 93%  Physical Exam  Vitals reviewed. Constitutional: She is oriented to person, place, and time and well-developed, well-nourished, and in no distress.  HENT:  Head: Normocephalic and atraumatic.  Right Ear: External ear normal.  Left Ear: External ear normal.  Nose: Nose normal.  Mouth/Throat: Oropharynx is clear and moist. No oropharyngeal exudate.  TM within normal limits bilaterally.  Tenderness to percussion of sinuses noted on examination.  Eyes: Conjunctivae are normal. Pupils are equal, round, and reactive to light.  Neck: Neck supple.  Cardiovascular: Normal rate, regular rhythm, normal heart sounds and intact distal pulses.   Pulmonary/Chest: Effort normal and breath sounds normal. No respiratory distress. She has no wheezes. She has no rales. She exhibits no tenderness.  Lymphadenopathy:    She has no cervical adenopathy.  Neurological: She is alert and oriented to person, place, and time.  Skin: Skin is warm and dry. No rash noted.   Psychiatric: Affect normal.   Recent Results (from the past 2160 hour(s))  TSH     Status: None   Collection Time    07/22/13 11:41 AM      Result Value Ref Range   TSH 3.704  0.350 - 4.500 uIU/mL   Assessment/Plan: Acute sinusitis with symptoms > 10 days Rx Azithromycin.  IM depo medrol injection given by nursing staff.  Take Mucinex-D as directed.  Continue Flonase.  Continue humidifier in the bedroom.  Use saline nasal spray. Discussed possibility of allergy, obstructive or inflammatory cause of symptoms.  Will need referral to ENT if symptoms do not improve with measures discussed at today's visit.

## 2013-10-04 NOTE — Patient Instructions (Signed)
Increase fluid intake.  Rest.  Saline nasal spray.  Take antibiotic as prescribed.  Take Mucinex-D as directed.  Continue running humidifier in the bedroom.  If symptoms are persisting, we will need to send you to see an ENT.  Sinusitis Sinusitis is redness, soreness, and swelling (inflammation) of the paranasal sinuses. Paranasal sinuses are air pockets within the bones of your face (beneath the eyes, the middle of the forehead, or above the eyes). In healthy paranasal sinuses, mucus is able to drain out, and air is able to circulate through them by way of your nose. However, when your paranasal sinuses are inflamed, mucus and air can become trapped. This can allow bacteria and other germs to grow and cause infection. Sinusitis can develop quickly and last only a short time (acute) or continue over a long period (chronic). Sinusitis that lasts for more than 12 weeks is considered chronic.  CAUSES  Causes of sinusitis include:  Allergies.  Structural abnormalities, such as displacement of the cartilage that separates your nostrils (deviated septum), which can decrease the air flow through your nose and sinuses and affect sinus drainage.  Functional abnormalities, such as when the small hairs (cilia) that line your sinuses and help remove mucus do not work properly or are not present. SYMPTOMS  Symptoms of acute and chronic sinusitis are the same. The primary symptoms are pain and pressure around the affected sinuses. Other symptoms include:  Upper toothache.  Earache.  Headache.  Bad breath.  Decreased sense of smell and taste.  A cough, which worsens when you are lying flat.  Fatigue.  Fever.  Thick drainage from your nose, which often is green and may contain pus (purulent).  Swelling and warmth over the affected sinuses. DIAGNOSIS  Your caregiver will perform a physical exam. During the exam, your caregiver may:  Look in your nose for signs of abnormal growths in your  nostrils (nasal polyps).  Tap over the affected sinus to check for signs of infection.  View the inside of your sinuses (endoscopy) with a special imaging device with a light attached (endoscope), which is inserted into your sinuses. If your caregiver suspects that you have chronic sinusitis, one or more of the following tests may be recommended:  Allergy tests.  Nasal culture A sample of mucus is taken from your nose and sent to a lab and screened for bacteria.  Nasal cytology A sample of mucus is taken from your nose and examined by your caregiver to determine if your sinusitis is related to an allergy. TREATMENT  Most cases of acute sinusitis are related to a viral infection and will resolve on their own within 10 days. Sometimes medicines are prescribed to help relieve symptoms (pain medicine, decongestants, nasal steroid sprays, or saline sprays).  However, for sinusitis related to a bacterial infection, your caregiver will prescribe antibiotic medicines. These are medicines that will help kill the bacteria causing the infection.  Rarely, sinusitis is caused by a fungal infection. In theses cases, your caregiver will prescribe antifungal medicine. For some cases of chronic sinusitis, surgery is needed. Generally, these are cases in which sinusitis recurs more than 3 times per year, despite other treatments. HOME CARE INSTRUCTIONS   Drink plenty of water. Water helps thin the mucus so your sinuses can drain more easily.  Use a humidifier.  Inhale steam 3 to 4 times a day (for example, sit in the bathroom with the shower running).  Apply a warm, moist washcloth to your face 3 to  4 times a day, or as directed by your caregiver.  Use saline nasal sprays to help moisten and clean your sinuses.  Take over-the-counter or prescription medicines for pain, discomfort, or fever only as directed by your caregiver. SEEK IMMEDIATE MEDICAL CARE IF:  You have increasing pain or severe  headaches.  You have nausea, vomiting, or drowsiness.  You have swelling around your face.  You have vision problems.  You have a stiff neck.  You have difficulty breathing. MAKE SURE YOU:   Understand these instructions.  Will watch your condition.  Will get help right away if you are not doing well or get worse. Document Released: 08/01/2005 Document Revised: 10/24/2011 Document Reviewed: 08/16/2011 [facility] Patient Information 2014 [place], [place].

## 2013-10-07 ENCOUNTER — Telehealth: Payer: Self-pay | Admitting: Family

## 2013-10-07 NOTE — Telephone Encounter (Signed)
Reviewed chart. She was due for a follow up diagnostic mammo with [facility] before the holidays.  Did she complete?  If not, I would recommend that she contact them to schedules.

## 2013-10-07 NOTE — Telephone Encounter (Signed)
Notified pt and provided her with phone # to [facility] and she will contact them to arrange appt.

## 2013-10-08 ENCOUNTER — Telehealth: Payer: Self-pay | Admitting: Family

## 2013-10-08 MED ORDER — OSELTAMIVIR PHOSPHATE 75 MG PO CAPS: 75.0000 mg | ORAL_CAPSULE | Freq: Every day | ORAL | Status: DC

## 2013-10-08 NOTE — Telephone Encounter (Signed)
Husband presented to office with flu.  Rx provided for pt prophylactic tamiflu.

## 2013-10-16 ENCOUNTER — Ambulatory Visit (INDEPENDENT_AMBULATORY_CARE_PROVIDER_SITE_OTHER): Payer: BC Managed Care – PPO | Admitting: Physician Assistant

## 2013-10-16 ENCOUNTER — Ambulatory Visit (HOSPITAL_BASED_OUTPATIENT_CLINIC_OR_DEPARTMENT_OTHER)
Admission: RE | Admit: 2013-10-16 | Discharge: 2013-10-16 | Disposition: A | Discharge status: Home / Self Care | Payer: BC Managed Care – PPO | Source: Ambulatory Visit | Attending: Physician Assistant | Admitting: Physician Assistant

## 2013-10-16 ENCOUNTER — Encounter: Payer: Self-pay | Admitting: Physician Assistant

## 2013-10-16 VITALS — BP 122/78 | HR 76 | Temp 98.5°F | Resp 16 | Ht 67.0 in | Wt 242.5 lb

## 2013-10-16 DIAGNOSIS — J40 Bronchitis, not specified as acute or chronic: Secondary | ICD-10-CM | POA: Insufficient documentation

## 2013-10-16 DIAGNOSIS — J329 Chronic sinusitis, unspecified: Secondary | ICD-10-CM

## 2013-10-16 DIAGNOSIS — J209 Acute bronchitis, unspecified: Secondary | ICD-10-CM

## 2013-10-16 MED ORDER — METHYLPREDNISOLONE (PAK) 4 MG PO TABS: ORAL_TABLET | ORAL | Status: DC

## 2013-10-16 MED ORDER — LEVOFLOXACIN 750 MG PO TABS: 750.0000 mg | ORAL_TABLET | Freq: Every day | ORAL | Status: DC

## 2013-10-16 NOTE — Progress Notes (Signed)
Pre visit review using our clinic review tool, if applicable. No additional management support is needed unless otherwise documented below in the visit note/SLS  

## 2013-10-16 NOTE — Patient Instructions (Signed)
Please go downstairs for CXR.  I will call you with your results.  Please take medications as prescribed.  Increase fluids, rest, continue using humidifier.  Mucinex if needed.  You will be contacted by ENT for an appointment.

## 2013-10-24 DIAGNOSIS — J329 Chronic sinusitis, unspecified: Secondary | ICD-10-CM | POA: Insufficient documentation

## 2013-10-24 DIAGNOSIS — J209 Acute bronchitis, unspecified: Secondary | ICD-10-CM | POA: Insufficient documentation

## 2013-10-24 NOTE — Assessment & Plan Note (Signed)
Referral to ENT.  Patient also with bronchitis with bronchospasm.  Rx Levaquin and Medrol dose pack. Continue supportive measures.

## 2013-10-24 NOTE — Progress Notes (Signed)
Patient presents to clinic today c/o continued sinus pain, sinus pressure, postnasal drip, productive cough and chest tightness.  Now with bilateral ear pain and mild SOB.  Denies hx of asthma.  Denies recent travel.  Patient was seen by another provider for symptoms and given azithromycin without improvement in symptoms. Has recently taken Tamiflu for influenza prophylaxis after exposure by family member.    Past Medical History  Diagnosis Date  . History of chicken pox   . Seizures     Pt states she had a couple of seizures in high school of undetermined cause  . Asymptomatic varicose veins   . Leg swelling   . Obesity   . Edema   . Unspecified sinusitis (chronic)   . Anemia 05/17/2011    Current Outpatient Prescriptions on File Prior to Visit  Medication Sig Dispense Refill  . cyclobenzaprine (FLEXERIL) 5 MG tablet TAKE 1 TABLET BY MOUTH AT BEDTIME AS NEEDED FOR MUSCLE SPASMS.  30 tablet  0  . fluticasone (FLONASE) 50 MCG/ACT nasal spray Place 2 sprays into both nostrils daily.  16 g  0  . ibuprofen (ADVIL,MOTRIN) 200 MG tablet Take 200 mg by mouth every 6 (six) hours as needed.       . meloxicam (MOBIC) 15 MG tablet Take 15 mg by mouth daily.      Marland Kitchen escitalopram (LEXAPRO) 10 MG tablet Take 1 tablet (10 mg total) by mouth daily.  30 tablet  2   No current facility-administered medications on file prior to visit.    No Known Allergies  Family History  Problem Relation Age of Onset  . Arthritis Mother   . Hypertension Mother   . Diabetes Mother   . Asthma Mother   . Hypertension Father   . Diabetes Maternal Grandmother   . Hypertension Maternal Grandmother   . Heart disease Maternal Grandmother   . Diabetes Paternal Grandmother   . Cancer Paternal Grandfather     lung  . Coronary artery disease Brother     History   Social History  . Marital Status: Married    Spouse Name: N/A    Number of Children: 6  . Years of Education: N/A   Social History Main Topics  .  Smoking status: Never Smoker   . Smokeless tobacco: Never Used  . Alcohol Use: No  . Drug Use: None  . Sexual Activity: None   Other Topics Concern  . None   Social History Narrative   Regular exercise:  No   Caffeine Use:  No   3 biological children, 3 [place]- one child aged [place]- Daughter Larene Beach (three children ages 29, 75 and 38)      44- denies   Never smoked.   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      Married to [employer]    Review of Systems - See HPI.  All other ROS are negative.  BP 122/78  Pulse 76  Temp(Src) 98.5 F (36.9 C) (Oral)  Resp 16  Ht 5\' 7"  (1.702 m)  Wt 242 lb 8 oz (109.997 kg)  BMI 37.97 kg/m2  SpO2 98%  LMP 04/15/2013  Physical Exam  Constitutional: She is oriented to person, place, and time and well-developed, well-nourished, and in no distress.  HENT:  Head: Normocephalic and atraumatic.  Right Ear: External ear normal.  Left Ear: External ear normal.  Nose: Nose normal.  Mouth/Throat: Oropharynx is clear and moist. No oropharyngeal exudate.  TTP of sinuses noted.  TM within normal limits bilaterally.  Eyes: Conjunctivae are normal. Pupils are equal, round, and reactive to light.  Neck: Neck supple.  Cardiovascular: Normal rate, regular rhythm, normal heart sounds and intact distal pulses.   Pulmonary/Chest: Effort normal. No respiratory distress. She has wheezes. She has no rales. She exhibits no tenderness.  Lymphadenopathy:    She has no cervical adenopathy.  Neurological: She is alert and oriented to person, place, and time.  Skin: Skin is warm and dry. No rash noted.  Psychiatric: Affect normal.    No results found for this or any previous visit (from the past 2160 hour(s)).  Assessment/Plan: Recurrent sinusitis Referral to ENT.  Patient also with bronchitis with bronchospasm.  Rx Levaquin and Medrol dose pack.  Continue supportive measures.  Bronchitis with bronchospasm Coarse breath sounds and mild wheeze on examination.  CXR ordered to r/o pneumonia.  Rx Levaquin.  Rx medrol dose pack.  Continue supportive measures.  Follow-up if no improvement within 48 hours.

## 2013-10-24 NOTE — Assessment & Plan Note (Signed)
Coarse breath sounds and mild wheeze on examination.  CXR ordered to r/o pneumonia.  Rx Levaquin.  Rx medrol dose pack.  Continue supportive measures.  Follow-up if no improvement within 48 hours.

## 2013-10-25 ENCOUNTER — Encounter: Payer: Self-pay | Admitting: Family

## 2013-11-06 ENCOUNTER — Other Ambulatory Visit: Payer: Self-pay | Admitting: Family

## 2014-03-14 ENCOUNTER — Ambulatory Visit (INDEPENDENT_AMBULATORY_CARE_PROVIDER_SITE_OTHER): Payer: BC Managed Care – PPO | Admitting: Physician Assistant

## 2014-03-14 ENCOUNTER — Encounter: Payer: Self-pay | Admitting: Physician Assistant

## 2014-03-14 VITALS — BP 122/86 | HR 78 | Temp 97.8°F | Resp 16 | Ht 67.0 in | Wt 252.0 lb

## 2014-03-14 DIAGNOSIS — J019 Acute sinusitis, unspecified: Secondary | ICD-10-CM

## 2014-03-14 MED ORDER — AMOXICILLIN-POT CLAVULANATE 875-125 MG PO TABS: 1.0000 | ORAL_TABLET | Freq: Two times a day (BID) | ORAL | Status: DC

## 2014-03-14 NOTE — Progress Notes (Signed)
Pre visit review using our clinic review tool, if applicable. No additional management support is needed unless otherwise documented below in the visit note. 

## 2014-03-14 NOTE — Assessment & Plan Note (Signed)
Rx Augmentin. Increase fluid intake.  Rest.  Saline nasal spray. Daily Flonase.  Continue Claritin.  Place humidifier in bedroom. Return precautions discussed with patient.

## 2014-03-14 NOTE — Progress Notes (Signed)
Patient presents to clinic today c/o 3 weeks of sinus pressure, sinus pain , ear pain, PND and sore throat.  Patient also endorses intermittent headaches.  Denies fever, chills, SOB or wheezing.  Has had occasional dry cough.  Denies recent travel or sick contact.  Patient states symptoms have significantly worsened over the past week.   Past Medical History  Diagnosis Date  . History of chicken pox   . Seizures     Pt states she had a couple of seizures in high school of undetermined cause  . Asymptomatic varicose veins   . Leg swelling   . Obesity   . Edema   . Unspecified sinusitis (chronic)   . Anemia 05/17/2011    Current Outpatient Prescriptions on File Prior to Visit  Medication Sig Dispense Refill  . escitalopram (LEXAPRO) 10 MG tablet TAKE 1 TABLET BY MOUTH DAILY.  30 tablet  3  . ibuprofen (ADVIL,MOTRIN) 200 MG tablet Take 200 mg by mouth every 6 (six) hours as needed.       . meloxicam (MOBIC) 15 MG tablet Take 15 mg by mouth daily.       No current facility-administered medications on file prior to visit.    No Known Allergies  Family History  Problem Relation Age of Onset  . Arthritis Mother   . Hypertension Mother   . Diabetes Mother   . Asthma Mother   . Hypertension Father   . Diabetes Maternal Grandmother   . Hypertension Maternal Grandmother   . Heart disease Maternal Grandmother   . Diabetes Paternal Grandmother   . Cancer Paternal Grandfather     lung  . Coronary artery disease Brother     History   Social History  . Marital Status: Married    Spouse Name: N/A    Number of Children: 6  . Years of Education: N/A   Social History Main Topics  . Smoking status: Never Smoker   . Smokeless tobacco: Never Used  . Alcohol Use: No  . Drug Use: None  . Sexual Activity: None   Other Topics Concern  . None   Social History Narrative   Regular exercise:  No   Caffeine Use:  No   3 biological children, 3 [place]- one  child aged [place]- Daughter Larene Beach (three children ages 68, 29 and 57)      89- denies   Never smoked.   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      Married to [employer]   Review of Systems - See HPI.  All other ROS are negative.  BP 122/86  Pulse 78  Temp(Src) 97.8 F (36.6 C) (Oral)  Resp 16  Ht 5\' 7"  (1.702 m)  Wt 252 lb 0.6 oz (114.325 kg)  BMI 39.47 kg/m2  SpO2 98%  LMP 04/15/2013  Physical Exam  Vitals reviewed. Constitutional: She is oriented to person, place, and time and well-developed, well-nourished, and in no distress.  HENT:  Head: Normocephalic and atraumatic.  Right Ear: External ear normal.  Left Ear: External ear normal.  Nose: Nose normal.  Mouth/Throat: Oropharynx is clear and moist. No oropharyngeal exudate.  + TTP of sinuses noted on exam.  Eyes: Conjunctivae are normal. Pupils are equal, round, and reactive to light.  Neck: Neck supple.  Cardiovascular: Normal rate, regular rhythm, normal heart sounds and  intact distal pulses.   Pulmonary/Chest: Effort normal and breath sounds normal. No respiratory distress. She has no wheezes. She has no rales. She exhibits no tenderness.  Lymphadenopathy:    She has no cervical adenopathy.  Neurological: She is alert and oriented to person, place, and time.  Skin: Skin is warm and dry. No rash noted.  Psychiatric: Affect normal.   Assessment/Plan: Acute sinusitis with symptoms > 10 days Rx Augmentin. Increase fluid intake.  Rest.  Saline nasal spray. Daily Flonase.  Continue Claritin.  Place humidifier in bedroom. Return precautions discussed with patient.

## 2014-03-14 NOTE — Patient Instructions (Signed)
Please take antibiotic as directed.  Increase fluid intake.  Use Saline nasal spray.  Take a daily multivitamin. Continue Loratadine and add daily Flonase to your regimen.  Place a humidifier in the bedroom.  Please call or return clinic if symptoms are not improving.  Sinusitis Sinusitis is redness, soreness, and swelling (inflammation) of the paranasal sinuses. Paranasal sinuses are air pockets within the bones of your face (beneath the eyes, the middle of the forehead, or above the eyes). In healthy paranasal sinuses, mucus is able to drain out, and air is able to circulate through them by way of your nose. However, when your paranasal sinuses are inflamed, mucus and air can become trapped. This can allow bacteria and other germs to grow and cause infection. Sinusitis can develop quickly and last only a short time (acute) or continue over a long period (chronic). Sinusitis that lasts for more than 12 weeks is considered chronic.  CAUSES  Causes of sinusitis include:  Allergies.  Structural abnormalities, such as displacement of the cartilage that separates your nostrils (deviated septum), which can decrease the air flow through your nose and sinuses and affect sinus drainage.  Functional abnormalities, such as when the small hairs (cilia) that line your sinuses and help remove mucus do not work properly or are not present. SYMPTOMS  Symptoms of acute and chronic sinusitis are the same. The primary symptoms are pain and pressure around the affected sinuses. Other symptoms include:  Upper toothache.  Earache.  Headache.  Bad breath.  Decreased sense of smell and taste.  A cough, which worsens when you are lying flat.  Fatigue.  Fever.  Thick drainage from your nose, which often is green and may contain pus (purulent).  Swelling and warmth over the affected sinuses. DIAGNOSIS  Your caregiver will perform a physical exam. During the exam, your caregiver may:  Look in your nose  for signs of abnormal growths in your nostrils (nasal polyps).  Tap over the affected sinus to check for signs of infection.  View the inside of your sinuses (endoscopy) with a special imaging device with a light attached (endoscope), which is inserted into your sinuses. If your caregiver suspects that you have chronic sinusitis, one or more of the following tests may be recommended:  Allergy tests.  Nasal culture A sample of mucus is taken from your nose and sent to a lab and screened for bacteria.  Nasal cytology A sample of mucus is taken from your nose and examined by your caregiver to determine if your sinusitis is related to an allergy. TREATMENT  Most cases of acute sinusitis are related to a viral infection and will resolve on their own within 10 days. Sometimes medicines are prescribed to help relieve symptoms (pain medicine, decongestants, nasal steroid sprays, or saline sprays).  However, for sinusitis related to a bacterial infection, your caregiver will prescribe antibiotic medicines. These are medicines that will help kill the bacteria causing the infection.  Rarely, sinusitis is caused by a fungal infection. In theses cases, your caregiver will prescribe antifungal medicine. For some cases of chronic sinusitis, surgery is needed. Generally, these are cases in which sinusitis recurs more than 3 times per year, despite other treatments. HOME CARE INSTRUCTIONS   Drink plenty of water. Water helps thin the mucus so your sinuses can drain more easily.  Use a humidifier.  Inhale steam 3 to 4 times a day (for example, sit in the bathroom with the shower running).  Apply a warm, moist washcloth  to your face 3 to 4 times a day, or as directed by your caregiver.  Use saline nasal sprays to help moisten and clean your sinuses.  Take over-the-counter or prescription medicines for pain, discomfort, or fever only as directed by your caregiver. SEEK IMMEDIATE MEDICAL CARE IF:  You  have increasing pain or severe headaches.  You have nausea, vomiting, or drowsiness.  You have swelling around your face.  You have vision problems.  You have a stiff neck.  You have difficulty breathing. MAKE SURE YOU:   Understand these instructions.  Will watch your condition.  Will get help right away if you are not doing well or get worse. Document Released: 08/01/2005 Document Revised: 10/24/2011 Document Reviewed: 08/16/2011 [facility] Patient Information 2014 [place], [place].

## 2014-04-03 ENCOUNTER — Encounter: Payer: Self-pay | Admitting: Physician Assistant

## 2014-04-03 ENCOUNTER — Ambulatory Visit (INDEPENDENT_AMBULATORY_CARE_PROVIDER_SITE_OTHER): Payer: BC Managed Care – PPO | Admitting: Physician Assistant

## 2014-04-03 VITALS — HR 79 | Temp 98.0°F | Resp 16 | Ht 67.0 in | Wt 254.5 lb

## 2014-04-03 DIAGNOSIS — J0110 Acute frontal sinusitis, unspecified: Secondary | ICD-10-CM

## 2014-04-03 DIAGNOSIS — E669 Obesity, unspecified: Secondary | ICD-10-CM

## 2014-04-03 DIAGNOSIS — J0111 Acute recurrent frontal sinusitis: Secondary | ICD-10-CM

## 2014-04-03 DIAGNOSIS — B029 Zoster without complications: Secondary | ICD-10-CM

## 2014-04-03 MED ORDER — PHENTERMINE HCL 37.5 MG PO TABS: 37.5000 mg | ORAL_TABLET | Freq: Every day | ORAL | Status: DC

## 2014-04-03 MED ORDER — MOXIFLOXACIN HCL 400 MG PO TABS: 400.0000 mg | ORAL_TABLET | Freq: Every day | ORAL | Status: DC

## 2014-04-03 MED ORDER — HYDROCODONE-ACETAMINOPHEN 5-325 MG PO TABS: 1.0000 | ORAL_TABLET | Freq: Three times a day (TID) | ORAL | Status: DC | PRN

## 2014-04-03 MED ORDER — VALACYCLOVIR HCL 1 G PO TABS: 1000.0000 mg | ORAL_TABLET | Freq: Three times a day (TID) | ORAL | Status: DC

## 2014-04-03 NOTE — Progress Notes (Signed)
Patient presents to clinic today c/o rash located at the back, just left of thoracic spine.  Endorses rash has been present for 5 days.  Rash is pruritic and aching in nature.  Patient endorses malaise and fatigue.  Patient denies fever or chills.  Endorses history of chickenpox as a child.  Patient also complains of significant sinus pressure and sinus pain over the past 2-3 weeks. Pain is associated with postnasal drip and thick, purulent rhinorrhea. Endorses mild dry cough. Denies shortness of breath, wheezing or pleuritic chest pain. Patient with history of recurrent sinusitis. Is followed by ENT.  Patient also has concerns over her difficulty with weight loss. Recent TSH within normal limits. Patient endorses trial of diet and exercise without success. Has tried over-the-counter Halog, as well as a Weight Watchers diet, without much success. Has never tried prescription medication. Denies history of coronary artery disease, palpitations or hypertension. Blood pressure is within normal limits in clinic.Body mass index is 39.85 kg/(m^2).   Past Medical History  Diagnosis Date  . History of chicken pox   . Seizures     Pt states she had a couple of seizures in high school of undetermined cause  . Asymptomatic varicose veins   . Leg swelling   . Obesity   . Edema   . Unspecified sinusitis (chronic)   . Anemia 05/17/2011    Current Outpatient Prescriptions on File Prior to Visit  Medication Sig Dispense Refill  . escitalopram (LEXAPRO) 10 MG tablet TAKE 1 TABLET BY MOUTH DAILY.  30 tablet  3  . ibuprofen (ADVIL,MOTRIN) 200 MG tablet Take 200 mg by mouth every 6 (six) hours as needed.       . meloxicam (MOBIC) 15 MG tablet Take 15 mg by mouth daily.       No current facility-administered medications on file prior to visit.    No Known Allergies  Family History  Problem Relation Age of Onset  . Arthritis Mother   . Hypertension Mother   . Diabetes Mother   . Asthma Mother   .  Hypertension Father   . Diabetes Maternal Grandmother   . Hypertension Maternal Grandmother   . Heart disease Maternal Grandmother   . Diabetes Paternal Grandmother   . Cancer Paternal Grandfather     lung  . Coronary artery disease Brother     History   Social History  . Marital Status: Married    Spouse Name: N/A    Number of Children: 6  . Years of Education: N/A   Social History Main Topics  . Smoking status: Never Smoker   . Smokeless tobacco: Never Used  . Alcohol Use: No  . Drug Use: None  . Sexual Activity: None   Other Topics Concern  . None   Social History Narrative   Regular exercise:  No   Caffeine Use:  No   3 biological children, 3 [place]- one child aged [place]- Daughter Larene Beach (three children ages 51, 75 and 47)      32- denies   Never smoked.   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      Married to [employer]   Review of Systems - See HPI.  All other ROS are negative.  Pulse 79  Temp(Src) 98 F (36.7 C) (Oral)  Resp 16  Ht 5\' 7"  (1.702 m)  Wt 254 lb 8 oz (115.44 kg)  BMI 39.85 kg/m2  SpO2 99%  LMP 04/15/2013  Physical Exam  Vitals reviewed. Constitutional: She is oriented to person, place, and time and well-developed, well-nourished, and in no distress.  HENT:  Head: Normocephalic and atraumatic.  Right Ear: Tympanic membrane, external ear and ear canal normal.  Left Ear: Tympanic membrane, external ear and ear canal normal.  Nose: Right sinus exhibits maxillary sinus tenderness and frontal sinus tenderness. Left sinus exhibits no maxillary sinus tenderness and no frontal sinus tenderness.  Mouth/Throat: Uvula is midline, oropharynx is clear and moist and mucous membranes are normal.  Eyes: Conjunctivae are normal. Pupils are equal, round, and reactive to light.  Neck: Neck supple.  Cardiovascular: Normal rate, regular rhythm, normal  heart sounds and intact distal pulses.   Pulmonary/Chest: Breath sounds normal. No respiratory distress. She has no wheezes. She has no rales. She exhibits no tenderness.  Lymphadenopathy:    She has no cervical adenopathy.  Neurological: She is alert and oriented to person, place, and time.  Skin: Skin is warm and dry. No rash noted.     Psychiatric: Affect normal.   Assessment/Plan: Shingles Rx Valtrex. Norco for pain. Discussed contagious nature of rash. Patient to keep rash covered while blisters are present. May receive benefit from topical lidocaine OTC.  Obesity, unspecified Negative CV history. Will start with one-month trial of phentermine. Patient educated on common side effects of medication. Followup in one month. Followup sooner if needed. Continue diet and exercise.  Acute recurrent ethmoidal sinusitis Rx Avelox. Increase fluid intake. Rest. Saline nasal spray. Probiotic. Mucinex. Place him in bedroom. Call or return to clinic if symptoms are not improving.

## 2014-04-03 NOTE — Progress Notes (Signed)
Pre visit review using our clinic review tool, if applicable. No additional management support is needed unless otherwise documented below in the visit note/SLS  

## 2014-04-03 NOTE — Patient Instructions (Addendum)
For sinus symptoms -- take Avelox as directed.  Increase fluid intake.  Rest.  Saline nasal spray. Tylenol sinus.  Follow-up with ENT.    For shingles -- take Valtrex three times daily as directed.  Use Norco for pain.  Cool compresses may be beneficial.  Avoid tight clothing or abrasive materials. Keep rash covered.  For weight loss -- continue diet and exercise.  Begin phentermine as directed after finishing medications for sinusitis and shingles.  Follow-up in 1 month.  If you develop racing heart while on medication, STOP medication and call the office.  Shingles Shingles (herpes zoster) is an infection that is caused by the same virus that causes chickenpox (varicella). The infection causes a painful skin rash and fluid-filled blisters, which eventually break open, crust over, and heal. It may occur in any area of the body, but it usually affects only one side of the body or face. The pain of shingles usually lasts about 1 month. However, some people with shingles may develop long-term (chronic) pain in the affected area of the body. Shingles often occurs many years after the person had chickenpox. It is more common:  In people older than 50 years.  In people with weakened immune systems, such as those with HIV, AIDS, or cancer.  In people taking medicines that weaken the immune system, such as transplant medicines.  In people under great stress. CAUSES  Shingles is caused by the varicella zoster virus (VZV), which also causes chickenpox. After a person is infected with the virus, it can remain in the person's body for years in an inactive state (dormant). To cause shingles, the virus reactivates and breaks out as an infection in a nerve root. The virus can be spread from person to person (contagious) through contact with open blisters of the shingles rash. It will only spread to people who have not had chickenpox. When these people are exposed to the virus, they may develop chickenpox. They  will not develop shingles. Once the blisters scab over, the person is no longer contagious and cannot spread the virus to others. SIGNS AND SYMPTOMS  Shingles shows up in stages. The initial symptoms may be pain, itching, and tingling in an area of the skin. This pain is usually described as burning, stabbing, or throbbing.In a few days or weeks, a painful red rash will appear in the area where the pain, itching, and tingling were felt. The rash is usually on one side of the body in a band or belt-like pattern. Then, the rash usually turns into fluid-filled blisters. They will scab over and dry up in approximately 2-3 weeks. Flu-like symptoms may also occur with the initial symptoms, the rash, or the blisters. These may include:  Fever.  Chills.  Headache.  Upset stomach. DIAGNOSIS  Your health care provider will perform a skin exam to diagnose shingles. Skin scrapings or fluid samples may also be taken from the blisters. This sample will be examined under a microscope or sent to a lab for further testing. TREATMENT  There is no specific cure for shingles. Your health care provider will likely prescribe medicines to help you manage the pain, recover faster, and avoid long-term problems. This may include antiviral drugs, anti-inflammatory drugs, and pain medicines. HOME CARE INSTRUCTIONS   Take a cool bath or apply cool compresses to the area of the rash or blisters as directed. This may help with the pain and itching.   Take medicines only as directed by your health care provider.  Rest as directed by your health care provider.  Keep your rash and blisters clean with mild soap and cool water or as directed by your health care provider.  Do not pick your blisters or scratch your rash. Apply an anti-itch cream or numbing creams to the affected area as directed by your health care provider.  Keep your shingles rash covered with a loose bandage (dressing).  Avoid skin contact  with:  Babies.   Pregnant women.   Children with eczema.   Elderly people with transplants.   People with chronic illnesses, such as leukemia or AIDS.   Wear loose-fitting clothing to help ease the pain of material rubbing against the rash.  Keep all follow-up visits as directed by your health care provider.If the area involved is on your face, you may receive a referral for a specialist, such as an eye doctor (ophthalmologist) or an ear, nose, and throat (ENT) doctor. Keeping all follow-up visits will help you avoid eye problems, chronic pain, or disability.  SEEK IMMEDIATE MEDICAL CARE IF:   You have facial pain, pain around the eye area, or loss of feeling on one side of your face.  You have ear pain or ringing in your ear.  You have loss of taste.  Your pain is not relieved with prescribed medicines.   Your redness or swelling spreads.   You have more pain and swelling.  Your condition is worsening or has changed.   You have a fever. MAKE SURE YOU:  Understand these instructions.  Will watch your condition.  Will get help right away if you are not doing well or get worse. Document Released: 08/01/2005 Document Revised: 12/16/2013 Document Reviewed: 03/15/2012 [facility] Patient Information 2015 [place], [place]. This information is not intended to replace advice given to you by your health care provider. Make sure you discuss any questions you have with your health care provider.

## 2014-04-07 DIAGNOSIS — B029 Zoster without complications: Secondary | ICD-10-CM | POA: Insufficient documentation

## 2014-04-07 DIAGNOSIS — J0121 Acute recurrent ethmoidal sinusitis: Secondary | ICD-10-CM | POA: Insufficient documentation

## 2014-04-07 DIAGNOSIS — E669 Obesity, unspecified: Secondary | ICD-10-CM | POA: Insufficient documentation

## 2014-04-07 NOTE — Assessment & Plan Note (Signed)
Rx Valtrex. Norco for pain. Discussed contagious nature of rash. Patient to keep rash covered while blisters are present. May receive benefit from topical lidocaine OTC.

## 2014-04-07 NOTE — Assessment & Plan Note (Signed)
Rx Avelox. Increase fluid intake. Rest. Saline nasal spray. Probiotic. Mucinex. Place him in bedroom. Call or return to clinic if symptoms are not improving.

## 2014-04-07 NOTE — Assessment & Plan Note (Signed)
Negative CV history. Will start with one-month trial of phentermine. Patient educated on common side effects of medication. Followup in one month. Followup sooner if needed. Continue diet and exercise.

## 2014-05-13 ENCOUNTER — Telehealth: Payer: Self-pay | Admitting: Physician Assistant

## 2014-05-13 DIAGNOSIS — J329 Chronic sinusitis, unspecified: Secondary | ICD-10-CM

## 2014-05-13 NOTE — Telephone Encounter (Signed)
Caller name:Kutter Emylia Relation to ZO:XWRU Call back number:[phone] Pharmacy: [facility].  Reason for call: pt is needing a referral to ENT, pt has bcbs, states [place] informed her if she got another sinus infection to let him know so that he could send her to a specialist. Pt has a sinus infection again, also would like to know if Einar Pheasant wants to see her again or if something can be called in until she can see the specialist.

## 2014-05-13 NOTE — Telephone Encounter (Signed)
Referral placed to ENT.  She will need an appointment to determine if antibiotics are warranted.

## 2014-05-16 ENCOUNTER — Ambulatory Visit (INDEPENDENT_AMBULATORY_CARE_PROVIDER_SITE_OTHER): Payer: BC Managed Care – PPO | Admitting: Medical

## 2014-05-16 ENCOUNTER — Ambulatory Visit (HOSPITAL_BASED_OUTPATIENT_CLINIC_OR_DEPARTMENT_OTHER)
Admission: RE | Admit: 2014-05-16 | Discharge: 2014-05-16 | Disposition: A | Discharge status: Home / Self Care | Payer: BC Managed Care – PPO | Source: Ambulatory Visit | Attending: Medical | Admitting: Medical

## 2014-05-16 ENCOUNTER — Encounter: Payer: Self-pay | Admitting: Medical

## 2014-05-16 VITALS — BP 112/81 | HR 80 | Temp 97.6°F | Ht 66.3 in | Wt 255.6 lb

## 2014-05-16 DIAGNOSIS — R059 Cough, unspecified: Secondary | ICD-10-CM

## 2014-05-16 DIAGNOSIS — J309 Allergic rhinitis, unspecified: Secondary | ICD-10-CM | POA: Insufficient documentation

## 2014-05-16 DIAGNOSIS — J069 Acute upper respiratory infection, unspecified: Secondary | ICD-10-CM | POA: Insufficient documentation

## 2014-05-16 DIAGNOSIS — R05 Cough: Secondary | ICD-10-CM

## 2014-05-16 DIAGNOSIS — M940 Chondrocostal junction syndrome [Tietze]: Secondary | ICD-10-CM

## 2014-05-16 DIAGNOSIS — J3089 Other allergic rhinitis: Secondary | ICD-10-CM

## 2014-05-16 DIAGNOSIS — J019 Acute sinusitis, unspecified: Secondary | ICD-10-CM

## 2014-05-16 MED ORDER — AZITHROMYCIN 250 MG PO TABS: ORAL_TABLET | ORAL | Status: DC

## 2014-05-16 MED ORDER — METHYLPREDNISOLONE ACETATE 40 MG/ML IJ SUSP
40.0000 mg | Freq: Once | INTRAMUSCULAR | Status: AC
  Administered 2014-05-16: 40 mg via INTRAMUSCULAR

## 2014-05-16 NOTE — Assessment & Plan Note (Addendum)
Depo medrol given for allergies may help costochondritis as well. Use alleve otc. Pt declined  Ekg. See avs advisement.

## 2014-05-16 NOTE — Assessment & Plan Note (Signed)
With pt persistent sinus pressure. Will see if depo-medrol gives relief. Note also giving azithromycin rx for current possible bronchitis. Note cough x 2 months on and off.

## 2014-05-16 NOTE — Assessment & Plan Note (Signed)
Will get cxr today. Possible bronchitis vs pneumonia. Rx of azithromycin.

## 2014-05-16 NOTE — Patient Instructions (Signed)
For your 2 month or more with persistent sinus pressure with some allergy symptoms, we gave you depomedrol im today. Continue flonase and get allegra otc.  You have had cough  for 2 month and will get cxr  today. I prescribed  azithromycin antibiotic for possible bronchitis vs pneumonia.  Your have a lot of features of costochondritis and I did recommend ekg today for safety sake. Your declined the ekg. So if you discomfort worsens as explained then go to the ED.  Call us early next week Monday or Tuesday and update [place] on if your sinus pressure has resolved.

## 2014-05-16 NOTE — Assessment & Plan Note (Signed)
  Since pt has symptoms for months did decide to give depo medrol in office. Continue fluticasone and get allegra otc.

## 2014-05-16 NOTE — Progress Notes (Signed)
Subjective:    Patient ID: Alexandra Mata, female    DOB: [birth year], 48 y.o.   MRN: [id number]  HPI   Pt in stating she has been battling upper respiratory infection for some time. 2 months of nasal congestion and dry cough. Also sinuses have been hurting all the time. Pt states [facility] day [facility] her for sinus infection. The gave her avelox and and proair. Pt also taking flonase regular basis still.  Pt has been sneezing a lot last 2 months. She is not diabetic.   Pt still has some sinus pressure. [place] did make referral. Exact appointment date not made yet.   Pt has pain on deep inspiration. States chest wall hurts when does so. She is not coughing much. When she does cough chest hurts for brief second. Even her bra against anterior chest hurts. No rash. Laying supine also makes it worse. No mid arm pain, no jaw pan. No diaphoresis. No leg pain.  Past Medical History  Diagnosis Date  . History of chicken pox   . Seizures     Pt states she had a couple of seizures in high school of undetermined cause  . Asymptomatic varicose veins   . Leg swelling   . Obesity   . Edema   . Unspecified sinusitis (chronic)   . Anemia 05/17/2011    History   Social History  . Marital Status: Married    Spouse Name: N/A    Number of Children: 6  . Years of Education: N/A   Occupational History  . Not on file.   Social History Main Topics  . Smoking status: Never Smoker   . Smokeless tobacco: Never Used  . Alcohol Use: No  . Drug Use: Not on file  . Sexual Activity: Not on file   Other Topics Concern  . Not on file   Social History Narrative   Regular exercise:  No   Caffeine Use:  No   3 biological children, 3 [place]- one child aged [place]- Daughter Larene Beach (three children ages 44, 69 and 41)      61- denies   Never smoked.   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      Married to Liborio Nixon    Past Surgical History  Procedure Laterality Date  . Tubal ligation  1991    `    Family History  Problem Relation Age of Onset  . Arthritis Mother   . Hypertension Mother   . Diabetes Mother   . Asthma Mother   . Hypertension Father   . Diabetes Maternal Grandmother   . Hypertension Maternal Grandmother   . Heart disease Maternal Grandmother   . Diabetes Paternal Grandmother   . Cancer Paternal Grandfather     lung  . Coronary artery disease Brother     No Known Allergies  Current Outpatient Prescriptions on File Prior to Visit  Medication Sig Dispense Refill  . ibuprofen (ADVIL,MOTRIN) 200 MG tablet Take 200 mg by mouth every 6 (six) hours as needed.       . meloxicam (MOBIC) 15 MG tablet Take 15 mg by mouth daily.      . phentermine (ADIPEX-P) 37.5 MG tablet Take 1 tablet (37.5 mg total) by mouth daily before breakfast.  30 tablet  0  . valACYclovir (  VALTREX) 1000 MG tablet Take 1 tablet (1,000 mg total) by mouth 3 (three) times daily.  21 tablet  0  . escitalopram (LEXAPRO) 10 MG tablet TAKE 1 TABLET BY MOUTH DAILY.  30 tablet  3  . HYDROcodone-acetaminophen (NORCO/VICODIN) 5-325 MG per tablet Take 1 tablet by mouth every 8 (eight) hours as needed for moderate pain.  45 tablet  0  . moxifloxacin (AVELOX) 400 MG tablet Take 1 tablet (400 mg total) by mouth daily.  10 tablet  0   No current facility-administered medications on file prior to visit.    BP 112/81  Pulse 80  Temp(Src) 97.6 F (36.4 C) (Oral)  Ht 5' 6.3" (1.684 m)  Wt 255 lb 9.6 oz (115.939 kg)  BMI 40.88 kg/m2  SpO2 100%  LMP 04/15/2013     Review of Systems  Constitutional: Negative for fever, chills and fatigue.  HENT: Positive for congestion, sinus pressure and sneezing. Negative for ear pain, nosebleeds, postnasal drip, sore throat, tinnitus and trouble swallowing.   Respiratory: Positive for cough. Negative for chest tightness, shortness of breath  and wheezing.   Cardiovascular: Negative for chest pain and palpitations.  Gastrointestinal: Negative.   Genitourinary: Negative.   Musculoskeletal:       Costochondral junction tenderness. Pain on palpation, deep inspiration and with upper ext movement.  Neurological: Negative.   Hematological: Negative for adenopathy. Does not bruise/bleed easily.       Objective:   Physical Exam  General  Mental Status - Alert. General Appearance - Well groomed. Not in acute distress.  Skin Rashes- No Rashes.  HEENT Head- Normal. Ear Auditory Canal - Left- Normal. Right - Normal.Tympanic Membrane- Left- Normal. Right- Normal. Eye Sclera/Conjunctiva- Left- Normal. Right- Normal. Nose & Sinuses Nasal Mucosa- Left-  Boggy + Congested. Right-  Boggy + Congested. Maxillary sinus pressure Mouth & Throat Lips: Upper Lip- Normal: no dryness, cracking, pallor, cyanosis, or vesicular eruption. Lower Lip-Normal: no dryness, cracking, pallor, cyanosis or vesicular eruption. Buccal Mucosa- Bilateral- No Aphthous ulcers. Oropharynx- No Discharge or Erythema. Tonsils: Characteristics- Bilateral- No Erythema or Congestion. Size/Enlargement- Bilateral- No enlargement. Discharge- bilateral-None.  Neck Neck- Supple. No Masses.   Chest and Lung Exam Auscultation: Breath Sounds:- clear, even and unlabored.  Cardiovascular Auscultation:Rythm- Regular, rate and rhythm.  Murmurs & Other Heart Sounds:Ausculatation of the heart reveal- No Murmurs.  Lymphatic Head & Neck General Head & Neck Lymphatics: Bilateral: Description- No Localized lymphadenopathy.  Anterior thorax- pt has costochondral junction tenderness to palpation bilaterally, Pain on deep inspiration as well. On movement of upper extremities against resistance she has costochondral junction pain that is  increased. If not palpating, no deep breathing and not moving arms she has not pain.  Lower extremities bilaterally- negative homans signs.  Calfs are symmetric         Assessment & Plan:

## 2014-06-24 ENCOUNTER — Other Ambulatory Visit: Payer: Self-pay | Admitting: Physician Assistant

## 2014-06-25 ENCOUNTER — Other Ambulatory Visit: Payer: Self-pay | Admitting: Physician Assistant

## 2014-06-25 NOTE — Telephone Encounter (Signed)
Medication Detail      Disp Refills Start End     phentermine (ADIPEX-P) 37.5 MG tablet 30 tablet 0 04/03/2014     Sig - Route: Take 1 tablet (37.5 mg total) by mouth daily before breakfast. - Oral    Class: Print     Associated Diagnoses    Obesity, unspecified        Please Advise on refills/SLS

## 2014-06-25 NOTE — Telephone Encounter (Signed)
2nd DENIAL-PATIENT NEEDS APPT FOR REFILL AUTHORIZATION/SLS

## 2014-06-30 ENCOUNTER — Ambulatory Visit (INDEPENDENT_AMBULATORY_CARE_PROVIDER_SITE_OTHER): Payer: Self-pay | Admitting: Physician Assistant

## 2014-06-30 ENCOUNTER — Encounter: Payer: Self-pay | Admitting: Physician Assistant

## 2014-06-30 ENCOUNTER — Other Ambulatory Visit: Payer: Self-pay | Admitting: Physician Assistant

## 2014-06-30 VITALS — BP 131/86 | HR 76 | Temp 98.7°F | Resp 16 | Ht 67.0 in | Wt 257.0 lb

## 2014-06-30 DIAGNOSIS — E669 Obesity, unspecified: Secondary | ICD-10-CM

## 2014-06-30 DIAGNOSIS — Z23 Encounter for immunization: Secondary | ICD-10-CM | POA: Insufficient documentation

## 2014-06-30 DIAGNOSIS — N951 Menopausal and female climacteric states: Secondary | ICD-10-CM

## 2014-06-30 MED ORDER — MELOXICAM 15 MG PO TABS: 15.0000 mg | ORAL_TABLET | Freq: Every day | ORAL | Status: DC

## 2014-06-30 MED ORDER — PHENTERMINE HCL 37.5 MG PO TABS: 37.5000 mg | ORAL_TABLET | Freq: Every day | ORAL | Status: DC

## 2014-06-30 MED ORDER — ESCITALOPRAM OXALATE 10 MG PO TABS: ORAL_TABLET | ORAL | Status: DC

## 2014-06-30 NOTE — Progress Notes (Signed)
Patient presents to clinic today for medication management.  Patient takes Lexapro 10 mg daily for post-menopausal hot flashes with good relief of symptoms.  Denies depressed mood, SI/HI.  Patient also on Phentermine previously for weight loss.  Had a one-month trial -- endorses few pound weight loss and curbed appetite.  Denies insomnia or palpitations on medication.  Past Medical History  Diagnosis Date  . History of chicken pox   . Seizures     Pt states she had a couple of seizures in high school of undetermined cause  . Asymptomatic varicose veins   . Leg swelling   . Obesity   . Edema   . Unspecified sinusitis (chronic)   . Anemia 05/17/2011    Current Outpatient Prescriptions on File Prior to Visit  Medication Sig Dispense Refill  . ibuprofen (ADVIL,MOTRIN) 200 MG tablet Take 200 mg by mouth every 6 (six) hours as needed.      No current facility-administered medications on file prior to visit.    No Known Allergies  Family History  Problem Relation Age of Onset  . Arthritis Mother   . Hypertension Mother   . Diabetes Mother   . Asthma Mother   . Hypertension Father   . Diabetes Maternal Grandmother   . Hypertension Maternal Grandmother   . Heart disease Maternal Grandmother   . Diabetes Paternal Grandmother   . Cancer Paternal Grandfather     lung  . Coronary artery disease Brother     History   Social History  . Marital Status: Married    Spouse Name: N/A    Number of Children: 6  . Years of Education: N/A   Social History Main Topics  . Smoking status: Never Smoker   . Smokeless tobacco: Never Used  . Alcohol Use: No  . Drug Use: None  . Sexual Activity: None   Other Topics Concern  . None   Social History Narrative   Regular exercise:  No   Caffeine Use:  No   3 biological children, 3 [place]- one child aged [place]- Daughter Larene Beach (three children ages 66, 61 and 23)      72- denies   Never  smoked.   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      Married to [employer]   Review of Systems - See HPI.  All other ROS are negative.  BP 131/86 mmHg  Pulse 76  Temp(Src) 98.7 F (37.1 C) (Oral)  Resp 16  Ht 5\' 7"  (1.702 m)  Wt 257 lb (116.574 kg)  BMI 40.24 kg/m2  SpO2 100%  LMP 04/15/2013  Physical Exam  Constitutional: She is oriented to person, place, and time and well-developed, well-nourished, and in no distress.  HENT:  Head: Normocephalic and atraumatic.  Eyes: Conjunctivae are normal.  Cardiovascular: Normal rate, regular rhythm, normal heart sounds and intact distal pulses.   Pulmonary/Chest: Effort normal and breath sounds normal. No respiratory distress. She has no wheezes. She has no rales. She exhibits no tenderness.  Neurological: She is alert and oriented to person, place, and time.  Skin: Skin is warm and dry. No rash noted.  Psychiatric: Affect normal.  Vitals reviewed.  Assessment/Plan: Obesity, unspecified Will resume Adipex for 2 months.  Continue diet and exercise.  Follow-up in 2 months.  Hot flashes, menopausal Lexapro refilled.

## 2014-06-30 NOTE — Progress Notes (Signed)
Pre visit review using our clinic review tool, if applicable. No additional management support is needed unless otherwise documented below in the visit note/SLS  

## 2014-06-30 NOTE — Patient Instructions (Signed)
Please continue medication as directed.  Follow-up with me in 2 months.  Return sooner if needed.   Tell your family I said hello!

## 2014-06-30 NOTE — Assessment & Plan Note (Signed)
Lexapro refilled. 

## 2014-06-30 NOTE — Assessment & Plan Note (Signed)
Will resume Adipex for 2 months.  Continue diet and exercise.  Follow-up in 2 months.

## 2014-07-15 ENCOUNTER — Telehealth: Payer: Self-pay

## 2014-07-15 NOTE — Telephone Encounter (Signed)
Alexandra Mata [place] got the flu shot on 06/30/14, she is now feeling very weak, sore throat, coughing, sneezing, body aches no fever at this time. She is wondering can you call something in or does she have to come in.

## 2014-07-16 ENCOUNTER — Encounter: Payer: Self-pay | Admitting: Medical

## 2014-07-16 ENCOUNTER — Ambulatory Visit (INDEPENDENT_AMBULATORY_CARE_PROVIDER_SITE_OTHER): Payer: BC Managed Care – PPO | Admitting: Medical

## 2014-07-16 VITALS — BP 125/86 | HR 83 | Temp 98.3°F | Ht 67.0 in | Wt 257.8 lb

## 2014-07-16 DIAGNOSIS — M609 Myositis, unspecified: Secondary | ICD-10-CM

## 2014-07-16 DIAGNOSIS — IMO0001 Reserved for inherently not codable concepts without codable children: Secondary | ICD-10-CM | POA: Insufficient documentation

## 2014-07-16 DIAGNOSIS — J029 Acute pharyngitis, unspecified: Secondary | ICD-10-CM

## 2014-07-16 DIAGNOSIS — B349 Viral infection, unspecified: Secondary | ICD-10-CM | POA: Insufficient documentation

## 2014-07-16 DIAGNOSIS — M791 Myalgia: Secondary | ICD-10-CM

## 2014-07-16 MED ORDER — CEFDINIR 300 MG PO CAPS: 300.0000 mg | ORAL_CAPSULE | Freq: Two times a day (BID) | ORAL | Status: DC

## 2014-07-16 MED ORDER — AZITHROMYCIN 250 MG PO TABS: ORAL_TABLET | ORAL | Status: DC

## 2014-07-16 MED ORDER — OSELTAMIVIR PHOSPHATE 75 MG PO CAPS: 75.0000 mg | ORAL_CAPSULE | Freq: Two times a day (BID) | ORAL | Status: DC

## 2014-07-16 NOTE — Progress Notes (Signed)
Pre visit review using our clinic review tool, if applicable. No additional management support is needed unless otherwise documented below in the visit note. 

## 2014-07-16 NOTE — Assessment & Plan Note (Signed)
Rapid strep was negative. I think sore thoat may be part of viral syndrome and maybe the flu.

## 2014-07-16 NOTE — Telephone Encounter (Signed)
We do not prescribe Antibiotics/medications without office visit; please advise patient/SLS

## 2014-07-16 NOTE — Progress Notes (Signed)
Subjective:    Patient ID: Alexandra Mata, female    DOB: Feb 05, 1966, 48 y.o.   MRN: [id number]  HPI  Monday mild fatigue at work. By Monday night bodycaches and some cough.  Pt has rapid acute onset of illness days ago. Reports fever, body aches, fatigue , ,runny nose, dry cough, and some sore throat. Pt has has comorbid conditions, no wheezing. No chest congestion.  Pt had moderate st yesterday and mild today.  Pt got fluvaccine on November 16th.  Menopause  Past Medical History  Diagnosis Date  . History of chicken pox   . Seizures     Pt states she had a couple of seizures in high school of undetermined cause  . Asymptomatic varicose veins   . Leg swelling   . Obesity   . Edema   . Unspecified sinusitis (chronic)   . Anemia 05/17/2011    History   Social History  . Marital Status: Married    Spouse Name: N/A    Number of Children: 6  . Years of Education: N/A   Occupational History  . Not on file.   Social History Main Topics  . Smoking status: Never Smoker   . Smokeless tobacco: Never Used  . Alcohol Use: No  . Drug Use: Not on file  . Sexual Activity: Not on file   Other Topics Concern  . Not on file   Social History Narrative   Regular exercise:  No   Caffeine Use:  No   3 biological children, 3 [place]- one child aged [place]- Daughter Larene Beach (three children ages 77, 28 and 68)      69- denies   Never smoked.   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      Married to Liborio Nixon    Past Surgical History  Procedure Laterality Date  . Tubal ligation  1991    `    Family History  Problem Relation Age of Onset  . Arthritis Mother   . Hypertension Mother   . Diabetes Mother   . Asthma Mother   . Hypertension Father   . Diabetes Maternal Grandmother   . Hypertension Maternal Grandmother   . Heart disease Maternal Grandmother   .  Diabetes Paternal Grandmother   . Cancer Paternal Grandfather     lung  . Coronary artery disease Brother     No Known Allergies  Current Outpatient Prescriptions on File Prior to Visit  Medication Sig Dispense Refill  . escitalopram (LEXAPRO) 10 MG tablet TAKE 1 TABLET BY MOUTH DAILY. 30 tablet 3  . ibuprofen (ADVIL,MOTRIN) 200 MG tablet Take 200 mg by mouth every 6 (six) hours as needed.     . meloxicam (MOBIC) 15 MG tablet Take 1 tablet (15 mg total) by mouth daily. 30 tablet 1  . phentermine (ADIPEX-P) 37.5 MG tablet Take 1 tablet (37.5 mg total) by mouth daily before breakfast. 30 tablet 0   No current facility-administered medications on file prior to visit.    BP 125/86 mmHg  Pulse 83  Temp(Src) 98.3 F (36.8 C) (Oral)  Ht 5\' 7"  (1.702 m)  Wt 257 lb 12.8 oz (116.937 kg)  BMI 40.37 kg/m2  SpO2 96%  LMP 04/15/2013     Review of Systems  Constitutional: Positive for fever and fatigue. Negative for chills.  HENT:  Positive for congestion, rhinorrhea and sore throat. Negative for postnasal drip and sinus pressure.   Respiratory: Positive for cough. Negative for wheezing.   Cardiovascular: Negative for chest pain and palpitations.  Musculoskeletal: Negative for neck pain.       Myalgias  Neurological: Negative for dizziness and headaches.  Hematological: Negative for adenopathy. Does not bruise/bleed easily.       Objective:   Physical Exam  General  Mental Status - Alert. General Appearance - Well groomed. Not in acute distress. Mid fatigue appearance.  Skin Rashes- No Rashes.  HEENT Head- Normal. Ear Auditory Canal - Left- Normal. Right - Normal.Tympanic Membrane- Left- Normal. Right- Normal. Eye Sclera/Conjunctiva- Left- Normal. Right- Normal. Nose & Sinuses Nasal Mucosa- Left-  Not boggy or Congested. Right-  Not  boggy or Congested. Mouth & Throat Lips: Upper Lip- Normal: no dryness, cracking, pallor, cyanosis, or vesicular eruption. Lower Lip-Normal:  no dryness, cracking, pallor, cyanosis or vesicular eruption. Buccal Mucosa- Bilateral- No Aphthous ulcers. Oropharynx- No Discharge or Erythema. Tonsils: Characteristics- Bilateral-  Mild-moderate  Erythema or Congestion. Size/Enlargement- Bilateral- !+ enlargement. Discharge- bilateral-None.  Neck Neck- Supple. No Masses. No neck stiffness. No lymphadenopathy.   Chest and Lung Exam Auscultation: Breath Sounds:- even and unlabored, but bilateral upper lobe rhonchi.  Cardiovascular Auscultation:Rythm- Regular, rate and rhythm. Murmurs & Other Heart Sounds:Ausculatation of the heart reveal- No Murmurs.  Lymphatic Head & Neck General Head & Neck Lymphatics: Bilateral: Description- No Localized lymphadenopathy.        Assessment & Plan:

## 2014-07-16 NOTE — Assessment & Plan Note (Signed)
Possible flu even though your  rapid flu test was negative.(Important to note sometimes flu test can be falsely negative and from year to year flu vaccine may not be effective.) Therefore,  I am treating you with tamiflu based on your clinical presentation. Rest, hydrate and take tylenol for fever. Alternate ibuprofen  if necessary for body ache or fever. You should gradually improve but  if you develop secondary bacterial infection signs and symptoms  please notify us so reevaluation or antibiotic can be prescribed.  If your throat pain worsens or any othe secondary infections sinus, bronchitis, om start cefdinir.  Both strep and flu test were negative.   If you don't improve with your energy level then you may need labs.

## 2014-07-16 NOTE — Assessment & Plan Note (Signed)
Described as severe enough that I suspect flu.

## 2014-07-16 NOTE — Patient Instructions (Addendum)
You appear to have the flu even though your  rapid flu test was negative.(Important to note sometimes flu test can be falsely negative.) Therefore,  I am treating you with tamiflu based on your clinical presentation. Rest, hydrate and take tylenol for fever. Alternate ibuprofen  if necessary for body ache or fever. You should gradually improve but  if you develop secondary bacterial infection signs and symptoms  please notify us so reevaluation or antibiotic can be prescribed.  If your throat pain worsens or any othe secondary infections sinus, bronchitis, om start cefdinir.  Both strep and flu test were negative.   If you don't improve with your energy level then you may need labs.  Follow up in 7 days or as needed.

## 2014-07-17 NOTE — Telephone Encounter (Signed)
Patient already came in for appointment

## 2014-08-11 ENCOUNTER — Emergency Department (HOSPITAL_BASED_OUTPATIENT_CLINIC_OR_DEPARTMENT_OTHER): Payer: BC Managed Care – PPO

## 2014-08-11 ENCOUNTER — Emergency Department (HOSPITAL_BASED_OUTPATIENT_CLINIC_OR_DEPARTMENT_OTHER)
Admission: EM | Admit: 2014-08-11 | Discharge: 2014-08-11 | Disposition: A | Discharge status: Home / Self Care | Payer: BC Managed Care – PPO | Attending: Emergency Medicine | Admitting: Emergency Medicine

## 2014-08-11 ENCOUNTER — Ambulatory Visit (INDEPENDENT_AMBULATORY_CARE_PROVIDER_SITE_OTHER): Payer: BC Managed Care – PPO | Admitting: Medical

## 2014-08-11 ENCOUNTER — Encounter (HOSPITAL_BASED_OUTPATIENT_CLINIC_OR_DEPARTMENT_OTHER): Payer: Self-pay

## 2014-08-11 ENCOUNTER — Encounter: Payer: Self-pay | Admitting: Medical

## 2014-08-11 VITALS — BP 131/87 | HR 86 | Temp 98.2°F | Ht 67.0 in | Wt 259.8 lb

## 2014-08-11 DIAGNOSIS — R52 Pain, unspecified: Secondary | ICD-10-CM

## 2014-08-11 DIAGNOSIS — Z8679 Personal history of other diseases of the circulatory system: Secondary | ICD-10-CM | POA: Diagnosis not present

## 2014-08-11 DIAGNOSIS — Z8619 Personal history of other infectious and parasitic diseases: Secondary | ICD-10-CM | POA: Diagnosis not present

## 2014-08-11 DIAGNOSIS — Z862 Personal history of diseases of the blood and blood-forming organs and certain disorders involving the immune mechanism: Secondary | ICD-10-CM | POA: Insufficient documentation

## 2014-08-11 DIAGNOSIS — Z791 Long term (current) use of non-steroidal anti-inflammatories (NSAID): Secondary | ICD-10-CM | POA: Insufficient documentation

## 2014-08-11 DIAGNOSIS — B349 Viral infection, unspecified: Secondary | ICD-10-CM

## 2014-08-11 DIAGNOSIS — Z8739 Personal history of other diseases of the musculoskeletal system and connective tissue: Secondary | ICD-10-CM | POA: Insufficient documentation

## 2014-08-11 DIAGNOSIS — R059 Cough, unspecified: Secondary | ICD-10-CM

## 2014-08-11 DIAGNOSIS — R062 Wheezing: Secondary | ICD-10-CM

## 2014-08-11 DIAGNOSIS — Z79899 Other long term (current) drug therapy: Secondary | ICD-10-CM | POA: Diagnosis not present

## 2014-08-11 DIAGNOSIS — E669 Obesity, unspecified: Secondary | ICD-10-CM | POA: Diagnosis not present

## 2014-08-11 DIAGNOSIS — J40 Bronchitis, not specified as acute or chronic: Secondary | ICD-10-CM | POA: Diagnosis not present

## 2014-08-11 DIAGNOSIS — R05 Cough: Secondary | ICD-10-CM

## 2014-08-11 LAB — POC INFLUENZA A&B (BINAX/QUICKVUE)
INFLUENZA A, POC: NEGATIVE
Influenza B, POC: NEGATIVE

## 2014-08-11 MED ORDER — ALBUTEROL SULFATE HFA 108 (90 BASE) MCG/ACT IN AERS
2.0000 | INHALATION_SPRAY | RESPIRATORY_TRACT | Status: DC | PRN
  Administered 2014-08-11: 2 via RESPIRATORY_TRACT
  Filled 2014-08-11: qty 6.7

## 2014-08-11 MED ORDER — HYDROCODONE-HOMATROPINE 5-1.5 MG/5ML PO SYRP: 5.0000 mL | ORAL_SOLUTION | Freq: Four times a day (QID) | ORAL | Status: DC | PRN

## 2014-08-11 MED ORDER — IPRATROPIUM-ALBUTEROL 0.5-2.5 (3) MG/3ML IN SOLN: 3.0000 mL | RESPIRATORY_TRACT | Status: DC

## 2014-08-11 MED ORDER — AZITHROMYCIN 250 MG PO TABS: ORAL_TABLET | ORAL | Status: DC

## 2014-08-11 MED ORDER — AZITHROMYCIN 250 MG PO TABS
500.0000 mg | ORAL_TABLET | Freq: Once | ORAL | Status: AC
  Administered 2014-08-11: 500 mg via ORAL
  Filled 2014-08-11: qty 2

## 2014-08-11 NOTE — Discharge Instructions (Signed)
Fever, Adult A fever is a higher than normal body temperature. In an adult, an oral temperature around 98.6 F (37 C) is considered normal. A temperature of 100.4 F (38 C) or higher is generally considered a fever. Mild or moderate fevers generally have no long-term effects and often do not require treatment. Extreme fever (greater than or equal to 106 F or 41.1 C) can cause seizures. The sweating that may occur with repeated or prolonged fever may cause dehydration. Elderly people can develop confusion during a fever. A measured temperature can vary with:  Age.  Time of day.  Method of measurement (mouth, underarm, rectal, or ear). The fever is confirmed by taking a temperature with a thermometer. Temperatures can be taken different ways. Some methods are accurate and some are not.  An oral temperature is used most commonly. Electronic thermometers are fast and accurate.  An ear temperature will only be accurate if the thermometer is positioned as recommended by the manufacturer.  A rectal temperature is accurate and done for those adults who have a condition where an oral temperature cannot be taken.  An underarm (axillary) temperature is not accurate and not recommended. Fever is a symptom, not a disease.  CAUSES   Infections commonly cause fever.  Some noninfectious causes for fever include:  Some arthritis conditions.  Some thyroid or adrenal gland conditions.  Some immune system conditions.  Some types of cancer.  A medicine reaction.  High doses of certain street drugs such as methamphetamine.  Dehydration.  Exposure to high outside or room temperatures.  Occasionally, the source of a fever cannot be determined. This is sometimes called a "fever of unknown origin" (FUO).  Some situations may lead to a temporary rise in body temperature that may go away on its own. Examples are:  Childbirth.  Surgery.  Intense exercise. HOME CARE INSTRUCTIONS   Take  appropriate medicines for fever. Follow dosing instructions carefully. If you use acetaminophen to reduce the fever, be careful to avoid taking other medicines that also contain acetaminophen. Do not take aspirin for a fever if you are younger than age 67. There is an association with Reye's syndrome. Reye's syndrome is a rare but potentially deadly disease.  If an infection is present and antibiotics have been prescribed, take them as directed. Finish them even if you start to feel better.  Rest as needed.  Maintain an adequate fluid intake. To prevent dehydration during an illness with prolonged or recurrent fever, you may need to drink extra fluid.Drink enough fluids to keep your urine clear or pale yellow.  Sponging or bathing with room temperature water may help reduce body temperature. Do not use ice water or alcohol sponge baths.  Dress comfortably, but do not over-bundle. SEEK MEDICAL CARE IF:   You are unable to keep fluids down.  You develop vomiting or diarrhea.  You are not feeling at least partly better after 3 days.  You develop new symptoms or problems. SEEK IMMEDIATE MEDICAL CARE IF:   You have shortness of breath or trouble breathing.  You develop excessive weakness.  You are dizzy or you faint.  You are extremely thirsty or you are making little or no urine.  You develop new pain that was not there before (such as in the head, neck, chest, back, or abdomen).  You have persistent vomiting and diarrhea for more than 1 to 2 days.  You develop a stiff neck or your eyes become sensitive to light.  You develop a  skin rash.  You have a fever or persistent symptoms for more than 2 to 3 days.  You have a fever and your symptoms suddenly get worse. MAKE SURE YOU:   Understand these instructions.  Will watch your condition.  Will get help right away if you are not doing well or get worse. Document Released: 01/25/2001 Document Revised: 12/16/2013 Document  Reviewed: 06/02/2011 [facility] Patient Information 2015 [place], [place]. This information is not intended to replace advice given to you by your health care provider. Make sure you discuss any questions you have with your health care provider.  Cough, Adult  A cough is a reflex that helps clear your throat and airways. It can help heal the body or may be a reaction to an irritated airway. A cough may only last 2 or 3 weeks (acute) or may last more than 8 weeks (chronic).  CAUSES Acute cough:  Viral or bacterial infections. Chronic cough:  Infections.  Allergies.  Asthma.  Post-nasal drip.  Smoking.  Heartburn or acid reflux.  Some medicines.  Chronic lung problems (COPD).  Cancer. SYMPTOMS   Cough.  Fever.  Chest pain.  Increased breathing rate.  High-pitched whistling sound when breathing (wheezing).  Colored mucus that you cough up (sputum). TREATMENT   A bacterial cough may be treated with antibiotic medicine.  A viral cough must run its course and will not respond to antibiotics.  Your caregiver may recommend other treatments if you have a chronic cough. HOME CARE INSTRUCTIONS   Only take over-the-counter or prescription medicines for pain, discomfort, or fever as directed by your caregiver. Use cough suppressants only as directed by your caregiver.  Use a cold steam vaporizer or humidifier in your bedroom or home to help loosen secretions.  Sleep in a semi-upright position if your cough is worse at night.  Rest as needed.  Stop smoking if you smoke. SEEK IMMEDIATE MEDICAL CARE IF:   You have pus in your sputum.  Your cough starts to worsen.  You cannot control your cough with suppressants and are losing sleep.  You begin coughing up blood.  You have difficulty breathing.  You develop pain which is getting worse or is uncontrolled with medicine.  You have a fever. MAKE SURE YOU:   Understand these instructions.  Will watch your  condition.  Will get help right away if you are not doing well or get worse. Document Released: 01/28/2011 Document Revised: 10/24/2011 Document Reviewed: 01/28/2011 [facility] Patient Information 2015 [place], [place]. This information is not intended to replace advice given to you by your health care provider. Make sure you discuss any questions you have with your health care provider.

## 2014-08-11 NOTE — ED Notes (Signed)
rx x 2 given for zithromax and hycodan

## 2014-08-11 NOTE — ED Notes (Signed)
Pt reports started getting sick on the 12/21 Pt reports coughing body aches.  Was seen by PCP upstairs adn sent down to ER for evaluation.  Reports gave duoneb but patient still sounds "tight.".

## 2014-08-11 NOTE — ED Notes (Signed)
MD at bedside. 

## 2014-08-11 NOTE — Assessment & Plan Note (Signed)
I have called down to ED(talked with charge nurse) and have explained your recent clinical presentation of onset fevers, chills, muscle aches, lethargy,  Severe wheezing and minimal response to duo neb in our office. I thinks at this late hour you would benefit from stat blood work(cbc), cxr to see if you have pneumonia and maybe iv medication for wheezing.  Follow up her as needed after ED work up. I took pt down stairs in wheel chair

## 2014-08-11 NOTE — ED Provider Notes (Signed)
CSN: [id number]     Arrival date & time 08/11/14  1716 History  This chart was scribed for [name], MD by Chester Holstein, ED Scribe. This patient was seen in room MH09/MH09 and the patient's care was started at 6:20 PM.    Chief Complaint  Patient presents with  . Wheezing    Patient is a 48 y.o. female presenting with wheezing. The history is provided by the patient. No language interpreter was used.  Wheezing Associated symptoms: chest tightness, cough and fever     HPI Comments: Alexandra Mata is a 48 y.o. female who presents to the Emergency Department complaining of wheezing with onset a week ago. Pt notes associated chest wall pain, subjective fever, body aches, and productive cough.  Pt states the cough produces dark thick greenish/yellow phlegm. She notes the cough has disturbed her sleep. Pt was sick at the beginning of December and was prescribed Abx and Tamiflu.  She notes the day after she completed the medication she felt bad again. Pt has taken this week Mucinex for relief.  Pt was seen today upstairs and referred to ED for evaluation. Pt denies history of wheezing and smoking. She has used an inhaler before.   Past Medical History  Diagnosis Date  . History of chicken pox   . Seizures     Pt states she had a couple of seizures in high school of undetermined cause  . Asymptomatic varicose veins   . Leg swelling   . Obesity   . Edema   . Unspecified sinusitis (chronic)   . Anemia 05/17/2011   Past Surgical History  Procedure Laterality Date  . Tubal ligation  1991    `   Family History  Problem Relation Age of Onset  . Arthritis Mother   . Hypertension Mother   . Diabetes Mother   . Asthma Mother   . Hypertension Father   . Diabetes Maternal Grandmother   . Hypertension Maternal Grandmother   . Heart disease Maternal Grandmother   . Diabetes Paternal Grandmother   . Cancer Paternal Grandfather     lung  . Coronary artery disease Brother    History   Substance Use Topics  . Smoking status: Never Smoker   . Smokeless tobacco: Never Used  . Alcohol Use: No   OB History    No data available     Review of Systems  Constitutional: Positive for fever.  HENT: Positive for congestion.   Respiratory: Positive for cough, chest tightness and wheezing.   Musculoskeletal: Positive for myalgias.  All other systems reviewed and are negative.     Allergies  Review of patient's allergies indicates no known allergies.  Home Medications   Prior to Admission medications   Medication Sig Start Date End Date Taking? Authorizing Provider  azithromycin (ZITHROMAX) 250 MG tablet Take 1 pill daily until gone 08/11/14   [name], MD  cefdinir (OMNICEF) 300 MG capsule Take 1 capsule (300 mg total) by mouth 2 (two) times daily. Patient not taking: Reported on 08/11/2014 07/16/14   Meriam Sprague Saguier, PA-C  escitalopram (LEXAPRO) 10 MG tablet TAKE 1 TABLET BY MOUTH DAILY. 06/30/14   Brunetta Jeans, PA-C  HYDROcodone-homatropine [facility]) 5-1.5 MG/5ML syrup Take 5 mLs by mouth every 6 (six) hours as needed for cough. 08/11/14   [name], MD  ibuprofen (ADVIL,MOTRIN) 200 MG tablet Take 200 mg by mouth every 6 (six) hours as needed.     Historical Provider,  MD  meloxicam (MOBIC) 15 MG tablet Take 1 tablet (15 mg total) by mouth daily. 06/30/14   Brunetta Jeans, PA-C  oseltamivir (TAMIFLU) 75 MG capsule Take 1 capsule (75 mg total) by mouth 2 (two) times daily. Patient not taking: Reported on 08/11/2014 07/16/14   Meriam Sprague Saguier, PA-C  phentermine (ADIPEX-P) 37.5 MG tablet Take 1 tablet (37.5 mg total) by mouth daily before breakfast. 06/30/14   Brunetta Jeans, PA-C   BP 148/96 mmHg  Pulse 82  Temp(Src) 99.7 F (37.6 C) (Oral)  Resp 20  Ht 5\' 7"  (1.702 m)  Wt 259 lb (117.482 kg)  BMI 40.56 kg/m2  SpO2 100%  LMP 04/15/2013 Physical Exam Physical Exam  Nursing note and vitals reviewed. Constitutional: She is oriented to person,  place, and time. She appears well-developed and well-nourished. No distress.  HENT:  Head: Normocephalic and atraumatic.  Eyes: Pupils are equal, round, and reactive to light.  Neck: Normal range of motion.  Cardiovascular: Normal rate and intact distal pulses.   Pulmonary/Chest: No respiratory distress.  scattered wheezes and rhonchi to auscultation. Abdominal: Normal appearance. She exhibits no distension.  Musculoskeletal: Normal range of motion.  Neurological: She is alert and oriented to person, place, and time. No cranial nerve deficit.  Skin: Skin is warm and dry. No rash noted.  Psychiatric: She has a normal mood and affect. Her behavior is normal.   ED Course  Procedures (including critical care time)  Medications  albuterol (PROVENTIL HFA;VENTOLIN HFA) 108 (90 BASE) MCG/ACT inhaler 2 puff (2 puffs Inhalation Given 08/11/14 1838)  azithromycin (ZITHROMAX) tablet 500 mg (500 mg Oral Given 08/11/14 1836)    DIAGNOSTIC STUDIES: Oxygen Saturation is 97% on room air, normal by my interpretation.    COORDINATION OF CARE: 6:28 PM Discussed treatment plan with patient at beside, the patient agrees with the plan and has no further questions at this time.   Labs Review Labs Reviewed - No data to display  Imaging Review Dg Chest 2 View  08/11/2014   CLINICAL DATA:  Wheezing, cough, right-sided chest pain.  EXAM: CHEST  2 VIEW  COMPARISON:  05/16/2014  FINDINGS: The heart size and mediastinal contours are within normal limits. Both lungs are clear. The visualized skeletal structures are unremarkable.  IMPRESSION: No active cardiopulmonary disease.   Electronically Signed   By: Rolm Baptise M.D.   On: 08/11/2014 17:56      MDM   Final diagnoses:  Bronchitis    I personally performed the services described in this documentation, which was scribed in my presence. The recorded information has been reviewed and considered.    [name], MD 08/11/14 [phone]

## 2014-08-11 NOTE — Patient Instructions (Addendum)
I have called down to ED(talked with charge nurse) and have explained your recent clinical presentation of onset fevers, chills, muscle aches, lethargy,  Severe wheezing and minimal response to duo neb in our office. I thinks at this late hour you would benefit from stat blood work(cbc), cxr to see if you have pneumonia and maybe iv medication for wheezing.  Follow up her as needed after ED work up. I took pt down stairs in wheel chair.

## 2014-08-11 NOTE — Progress Notes (Signed)
Pre visit review using our clinic review tool, if applicable. No additional management support is needed unless otherwise documented below in the visit note. 

## 2014-08-11 NOTE — Progress Notes (Signed)
   Subjective:    Patient ID: [name], female    DOB: Feb 28, 1966, 48 y.o.   MRN: [id number]  HPI   Pt in today reporting  cough, nasal congestion and runny nose for  4 Days.  Then yesterday body aches all over. Feeling achy and lethargic/fatigued. Pt is wheezing. Pt states on December seconds she had illness that prompted provider  to give both cefdinir and tamiflu(on review that was myself). By the time finished 10 th day of antibiotic she felt better. But day after antibiotic she started to worsen. Acute severe worsening since yesterday.  Pt feels feverish and chills and body aches since yesterday with wheezing.   Pt not a smoker. Pt never been diagnosed with asthma.  LMP- 2 years ago. Premature menopause.     Associated symptoms( below yes or no)  Fever-yes(but subjective fever) Chills-yes Chest congestion-yes. Left lower rib area pain last hurting when she was coughing.  Sneezing- no Itching eyes-no Sore throat- no Post-nasal drainage-when blows nose  Wheezing-yes Purulent drainageno- Fatigue-ye      Review of Systems See above      Objective:   Physical Exam   General  Mental Status - Alert. General Appearance - looks extremely fatigued.  Skin Rashes- No Rashes.  HEENT Head- Normal. Ear Auditory Canal - Left- Normal. Right - Normal.Tympanic Membrane- Left- Normal. Right- Normal. Eye Sclera/Conjunctiva- Left- Normal. Right- Normal. Nose & Sinuses Nasal Mucosa- Left- Mild  boggy +  Congested. Right- Mild   boggy + Congested. Mouth & Throat Lips: Upper Lip- Normal: no dryness, cracking, pallor, cyanosis, or vesicular eruption. Lower Lip-Normal: no dryness, cracking, pallor, cyanosis or vesicular eruption. Buccal Mucosa- Bilateral- No Aphthous ulcers. Oropharynx- No Discharge or Erythema. Tonsils: Characteristics- Bilateral- No Erythema or Congestion. Size/Enlargement- Bilateral- No enlargement. Discharge- bilateral-None.  Neck Neck- Supple. No  Masses.   Chest and Lung Exam Auscultation: Breath Sounds:- even and unlabored but very shallow and tight, faint  Rhonchi heard initially. Post duoneb rhonchi cleared but still very tight.  Cardiovascular Auscultation:Rythm- Regular, rate and rhythm. Murmurs & Other Heart Sounds:Ausculatation of the heart reveal- No Murmurs.  Lymphatic Head & Neck General Head & Neck Lymphatics: Bilateral: Description- No Localized lymphadenopathy.         Assessment & Plan:

## 2014-11-05 ENCOUNTER — Ambulatory Visit (INDEPENDENT_AMBULATORY_CARE_PROVIDER_SITE_OTHER): Payer: 59 | Admitting: Physician Assistant

## 2014-11-05 ENCOUNTER — Telehealth: Payer: Self-pay | Admitting: Physician Assistant

## 2014-11-05 ENCOUNTER — Encounter: Payer: Self-pay | Admitting: Physician Assistant

## 2014-11-05 VITALS — BP 132/86 | HR 78 | Temp 98.4°F | Resp 16 | Ht 67.0 in | Wt 255.4 lb

## 2014-11-05 DIAGNOSIS — E669 Obesity, unspecified: Secondary | ICD-10-CM | POA: Diagnosis not present

## 2014-11-05 MED ORDER — LORCASERIN HCL 10 MG PO TABS: 10.0000 mg | ORAL_TABLET | Freq: Two times a day (BID) | ORAL | Status: DC

## 2014-11-05 MED ORDER — MELOXICAM 15 MG PO TABS: 15.0000 mg | ORAL_TABLET | Freq: Every day | ORAL | Status: DC

## 2014-11-05 NOTE — Telephone Encounter (Signed)
Appointment scheduled for 11/05/14

## 2014-11-05 NOTE — Telephone Encounter (Signed)
Medication Detail      Disp Refills Start End     meloxicam (MOBIC) 15 MG tablet 30 tablet 1 06/30/2014     Sig - Route: Take 1 tablet (15 mg total) by mouth daily. - Oral    E-Prescribing Status: Receipt confirmed by pharmacy (06/30/2014 4:06 PM EST)     Pharmacy    [place], [place]   Patient never prescribed Phenergan by PCP, Meloxicam not regularly prescribed; per Provider, no Rx refills without Office Visit/SLS Please call patient and arrange F/U appointment prior to medication authorizations/SLS Thanks.

## 2014-11-05 NOTE — Telephone Encounter (Signed)
Caller name: Adonai Relation to pt: self Call back number: [phone] Pharmacy: [facility] [address]  Reason for call:   Requesting meloxicam and phenergren refill

## 2014-11-06 NOTE — Assessment & Plan Note (Signed)
Cannot repeat phentermine for at least 3 months. Patient needs drug holiday from this medicine. Discussed other weight loss options. Will attempt trial of Belvic. Rx and voucher given. Follow-up in one month.

## 2014-11-06 NOTE — Progress Notes (Signed)
Patient presents to clinic today for medication management. Patient previously on phentermine for weight loss. Endorses good improvement of weight on medication. Has completed 3 months of medication. Is now due for a medication holiday. Would like to discuss other options for weight loss that are non-stimulant. Is exercising daily and trying to watch her diet.  Past Medical History  Diagnosis Date  . History of chicken pox   . Seizures     Pt states she had a couple of seizures in high school of undetermined cause  . Asymptomatic varicose veins   . Leg swelling   . Obesity   . Edema   . Unspecified sinusitis (chronic)   . Anemia 05/17/2011    Current Outpatient Prescriptions on File Prior to Visit  Medication Sig Dispense Refill  . escitalopram (LEXAPRO) 10 MG tablet TAKE 1 TABLET BY MOUTH DAILY. 30 tablet 3  . ibuprofen (ADVIL,MOTRIN) 200 MG tablet Take 200 mg by mouth every 6 (six) hours as needed.     . phentermine (ADIPEX-P) 37.5 MG tablet Take 1 tablet (37.5 mg total) by mouth daily before breakfast. 30 tablet 0   No current facility-administered medications on file prior to visit.    No Known Allergies  Family History  Problem Relation Age of Onset  . Arthritis Mother   . Hypertension Mother   . Diabetes Mother   . Asthma Mother   . Hypertension Father   . Diabetes Maternal Grandmother   . Hypertension Maternal Grandmother   . Heart disease Maternal Grandmother   . Diabetes Paternal Grandmother   . Cancer Paternal Grandfather     lung  . Coronary artery disease Brother     History   Social History  . Marital Status: Married    Spouse Name: N/A  . Number of Children: 6  . Years of Education: N/A   Social History Main Topics  . Smoking status: Never Smoker   . Smokeless tobacco: Never Used  . Alcohol Use: No  . Drug Use: Not on file  . Sexual Activity: Not on file   Other Topics Concern  . None   Social History Narrative   Regular exercise:  No   Caffeine Use:  No   3 biological children, 3 [place]- one child aged [place]- Daughter Larene [place] (three children ages 40, 32 and 11)      78- denies   Never smoked.   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      Married to [employer]    Review of Systems - See HPI.  All other ROS are negative.  BP 132/86 mmHg  Pulse 78  Temp(Src) 98.4 F (36.9 C) (Oral)  Resp 16  Ht 5\' 7"  (1.702 m)  Wt 255 lb 6 oz (115.837 kg)  BMI 39.99 kg/m2  SpO2 100%  LMP 04/15/2013  Physical Exam  Constitutional: She is oriented to person, place, and time and well-developed, well-nourished, and in no distress.  HENT:  Head: Normocephalic and atraumatic.  Cardiovascular: Normal rate, regular rhythm, normal heart sounds and intact distal pulses.   Pulmonary/Chest: Effort normal and breath sounds normal. No respiratory distress. She has no wheezes. She has no rales. She exhibits no tenderness.  Neurological: She is alert and oriented to person, place, and time.  Skin: Skin is warm and dry. No rash noted.  Psychiatric: Affect normal.  Vitals reviewed.  Recent Results (from the past 2160 hour(s))  POC Influenza A&B (Binax test)     Status: Normal   Collection Time: 08/11/14  4:00 PM  Result Value Ref Range   Influenza A, POC Negative    Influenza B, POC Negative     Assessment/Plan: Obesity Cannot repeat phentermine for at least 3 months. Patient needs drug holiday from this medicine. Discussed other weight loss options. Will attempt trial of Belvic. Rx and voucher given. Follow-up in one month.

## 2014-11-17 ENCOUNTER — Telehealth: Payer: Self-pay | Admitting: Physician Assistant

## 2014-11-17 NOTE — Telephone Encounter (Signed)
Caller Pharmacy:  [place], [place] - [address] [phone] (Phone) [phone] (Fax)         Reason for call:  [place] states pt lost coupon and requesting the telephone number off coupon only. Please advise

## 2014-11-17 NOTE — Telephone Encounter (Signed)
Pharmacy states coupon was given to pt for the Lorcaserin HCl (BELVIQ) 10 MG TABS

## 2014-11-19 ENCOUNTER — Telehealth: Payer: Self-pay | Admitting: Physician Assistant

## 2014-11-19 MED ORDER — VALACYCLOVIR HCL 1 G PO TABS: 1000.0000 mg | ORAL_TABLET | Freq: Three times a day (TID) | ORAL | Status: DC

## 2014-11-19 NOTE — Telephone Encounter (Signed)
Copy of Belviq savings card and discount coupon faxed to patient's pharmacy/SLS

## 2014-11-19 NOTE — Telephone Encounter (Signed)
Will grant Rx this time. Rx sent to pharmacy. Follow-up if symptoms are not improving.

## 2014-11-19 NOTE — Telephone Encounter (Signed)
Caller name: Alexandra Mata Relation to pt: self Call back number:  [phone] Pharmacy: [facility] [address]  Reason for call:   Patient states that she has has another shingles outbreak and is requesting something to be called in.

## 2014-11-19 NOTE — Telephone Encounter (Signed)
Informed patient of this.  °

## 2014-12-23 ENCOUNTER — Other Ambulatory Visit: Payer: Self-pay | Admitting: Physician Assistant

## 2014-12-26 ENCOUNTER — Encounter: Payer: Self-pay | Admitting: Physician Assistant

## 2014-12-26 ENCOUNTER — Ambulatory Visit (INDEPENDENT_AMBULATORY_CARE_PROVIDER_SITE_OTHER): Payer: 59 | Admitting: Physician Assistant

## 2014-12-26 VITALS — BP 136/84 | HR 85 | Temp 98.6°F | Wt 257.0 lb

## 2014-12-26 DIAGNOSIS — J208 Acute bronchitis due to other specified organisms: Principal | ICD-10-CM

## 2014-12-26 DIAGNOSIS — B9689 Other specified bacterial agents as the cause of diseases classified elsewhere: Secondary | ICD-10-CM

## 2014-12-26 DIAGNOSIS — J00 Acute nasopharyngitis [common cold]: Secondary | ICD-10-CM

## 2014-12-26 LAB — POCT RAPID STREP A (OFFICE): Rapid Strep A Screen: NEGATIVE

## 2014-12-26 MED ORDER — HYDROCOD POLST-CPM POLST ER 10-8 MG/5ML PO SUER: 5.0000 mL | Freq: Two times a day (BID) | ORAL | Status: DC | PRN

## 2014-12-26 MED ORDER — DOXYCYCLINE HYCLATE 100 MG PO CAPS
100.0000 mg | ORAL_CAPSULE | Freq: Two times a day (BID) | ORAL | Status: DC
Start: 2014-12-26 — End: 2015-01-02

## 2014-12-26 NOTE — Assessment & Plan Note (Addendum)
Rapid strep negative. Rx Doxycycline.  Rx Tussionex for cough. Mucinex for congestion.  Increase fluid intake.  Rest Humidifier in bedroom.  Call or return to clinic if symptoms are not improving.

## 2014-12-26 NOTE — Addendum Note (Signed)
Addended by: Peggyann Shoals on: 12/26/2014 11:58 AM   Modules accepted: Orders

## 2014-12-26 NOTE — Patient Instructions (Signed)
Please stay well hydrated.  Use Mucinex for congestion. Take antibiotic as directed.  Use Tussionex for cough. Get plenty of rest.  Put a humidifier in the bedroom. Call or return to clinic if symptoms are not improving.

## 2014-12-26 NOTE — Progress Notes (Signed)
Pre visit review using our clinic review tool, if applicable. No additional management support is needed unless otherwise documented below in the visit note. 

## 2014-12-26 NOTE — Progress Notes (Signed)
Patient presents to clinic today c/o 1 week of worsening sore throat, productive cough, fatigue and body aches.  Endorses some mild voice hoarseness and chest discomfort with coughing. Denies SOB or chest pain.  Denies fever or tooth pain but notes bilateral ear pain.  Denies recent travel or sick contact..  Past Medical History  Diagnosis Date  . History of chicken pox   . Seizures     Pt states she had a couple of seizures in high school of undetermined cause  . Asymptomatic varicose veins   . Leg swelling   . Obesity   . Edema   . Unspecified sinusitis (chronic)   . Anemia 05/17/2011    Current Outpatient Prescriptions on File Prior to Visit  Medication Sig Dispense Refill  . escitalopram (LEXAPRO) 10 MG tablet TAKE ONE TABLET BY MOUTH ONCE DAILY 30 tablet 0  . ibuprofen (ADVIL,MOTRIN) 200 MG tablet Take 200 mg by mouth every 6 (six) hours as needed.     . meloxicam (MOBIC) 15 MG tablet Take 1 tablet (15 mg total) by mouth daily. 30 tablet 1   No current facility-administered medications on file prior to visit.    No Known Allergies  Family History  Problem Relation Age of Onset  . Arthritis Mother   . Hypertension Mother   . Diabetes Mother   . Asthma Mother   . Hypertension Father   . Diabetes Maternal Grandmother   . Hypertension Maternal Grandmother   . Heart disease Maternal Grandmother   . Diabetes Paternal Grandmother   . Cancer Paternal Grandfather     lung  . Coronary artery disease Brother     History   Social History  . Marital Status: Married    Spouse Name: N/A  . Number of Children: 6  . Years of Education: N/A   Social History Main Topics  . Smoking status: Never Smoker   . Smokeless tobacco: Never Used  . Alcohol Use: No  . Drug Use: Not on file  . Sexual Activity: Not on file   Other Topics Concern  . None   Social History Narrative   Regular exercise:  No   Caffeine Use:  No   3 biological children, 3 [place]- one child aged [place]- Daughter Larene Beach (three children ages 44, 34 and 32)      89- denies   Never smoked.   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      Married to [employer]   Review of Systems - See HPI.  All other ROS are negative.  BP 136/84 mmHg  Pulse 85  Temp(Src) 98.6 F (37 C)  Wt 257 lb (116.574 kg)  SpO2 98%  LMP 04/15/2013  Physical Exam  Constitutional: She is oriented to person, place, and time and well-developed, well-nourished, and in no distress.  HENT:  Head: Normocephalic and atraumatic.  Right Ear: External ear normal.  Left Ear: External ear normal.  Nose: Nose normal.  Mouth/Throat: Uvula is midline and mucous membranes are normal. Posterior oropharyngeal erythema present. No oropharyngeal exudate or posterior oropharyngeal edema.  Eyes: Conjunctivae are normal. Pupils are equal, round, and reactive to light.  Neck: Neck supple.  Cardiovascular: Normal rate, regular rhythm, normal heart sounds and intact distal pulses.   Pulmonary/Chest: Effort normal and breath sounds normal. No respiratory distress. She has  no wheezes. She has no rales. She exhibits no tenderness.  Lymphadenopathy:    She has no cervical adenopathy.  Neurological: She is alert and oriented to person, place, and time.  Skin: Skin is warm and dry. No rash noted.  Psychiatric: Affect normal.  Vitals reviewed.   No results found for this or any previous visit (from the past 2160 hour(s)).  Assessment/Plan: Acute bacterial bronchitis Rapid strep negative. Rx Doxycycline.  Rx Tussionex for cough. Mucinex for congestion.  Increase fluid intake.  Rest Humidifier in bedroom.  Call or return to clinic if symptoms are not improving.

## 2015-01-02 ENCOUNTER — Telehealth: Payer: Self-pay | Admitting: Physician Assistant

## 2015-01-02 MED ORDER — CLARITHROMYCIN 250 MG PO TABS: 250.0000 mg | ORAL_TABLET | Freq: Two times a day (BID) | ORAL | Status: DC

## 2015-01-02 NOTE — Telephone Encounter (Signed)
Relation to pt: self Call back number: [phone] Pharmacy: [facility] [address] ([address]), [place] [phone] (Phone) [phone] (Fax)         Reason for call:  Pt was last seen 12/26/2014 and symptoms have not improved pt is experiencing chest congestion & coughing. Requesting RX

## 2015-01-02 NOTE — Telephone Encounter (Signed)
LVM advising pt of PA instructions

## 2015-01-02 NOTE — Telephone Encounter (Signed)
Rx clarithromycin sent to pharmacy.

## 2015-01-14 ENCOUNTER — Encounter: Payer: Self-pay | Admitting: Physician Assistant

## 2015-01-14 ENCOUNTER — Ambulatory Visit (INDEPENDENT_AMBULATORY_CARE_PROVIDER_SITE_OTHER): Payer: 59 | Admitting: Physician Assistant

## 2015-01-14 VITALS — BP 142/81 | HR 76 | Temp 98.0°F | Ht 67.0 in | Wt 259.8 lb

## 2015-01-14 DIAGNOSIS — R609 Edema, unspecified: Secondary | ICD-10-CM | POA: Diagnosis not present

## 2015-01-14 MED ORDER — HYDROCODONE-ACETAMINOPHEN 10-325 MG PO TABS: 1.0000 | ORAL_TABLET | Freq: Three times a day (TID) | ORAL | Status: DC | PRN

## 2015-01-14 MED ORDER — FUROSEMIDE 20 MG PO TABS: 20.0000 mg | ORAL_TABLET | Freq: Every day | ORAL | Status: DC

## 2015-01-14 NOTE — Progress Notes (Signed)
Patient presents to clinic today c/o 5 days of left leg cramping and tenderness with walking.  Also endorses swelling of feet bilaterally associated with pain in the dorsum of feet bilaterally with R> L.  Denies change in diet. Denies chest pain or SOB.  Past Medical History  Diagnosis Date  . History of chicken pox   . Seizures     Pt states she had a couple of seizures in high school of undetermined cause  . Asymptomatic varicose veins   . Leg swelling   . Obesity   . Edema   . Unspecified sinusitis (chronic)   . Anemia 05/17/2011    Current Outpatient Prescriptions on File Prior to Visit  Medication Sig Dispense Refill  . escitalopram (LEXAPRO) 10 MG tablet TAKE ONE TABLET BY MOUTH ONCE DAILY 30 tablet 0  . ibuprofen (ADVIL,MOTRIN) 200 MG tablet Take 200 mg by mouth every 6 (six) hours as needed.      No current facility-administered medications on file prior to visit.    No Known Allergies  Family History  Problem Relation Age of Onset  . Arthritis Mother   . Hypertension Mother   . Diabetes Mother   . Asthma Mother   . Hypertension Father   . Diabetes Maternal Grandmother   . Hypertension Maternal Grandmother   . Heart disease Maternal Grandmother   . Diabetes Paternal Grandmother   . Cancer Paternal Grandfather     lung  . Coronary artery disease Brother     History   Social History  . Marital Status: Married    Spouse Name: N/A  . Number of Children: 6  . Years of Education: N/A   Social History Main Topics  . Smoking status: Never Smoker   . Smokeless tobacco: Never Used  . Alcohol Use: No  . Drug Use: Not on file  . Sexual Activity: Not on file   Other Topics Concern  . None   Social History Narrative   Regular exercise:  No   Caffeine Use:  No   3 biological children, 3 [place]- one child aged [place]- Daughter Larene Beach (three children ages 26, 56 and 26)      54- denies   Never smoked.   Denies  drug use      Ashley Mariner- working on her bachelors in Early Childhood Education.      Married to [employer]   Review of Systems - See HPI.  All other ROS are negative.  BP 142/81 mmHg  Pulse 76  Temp(Src) 98 F (36.7 C) (Oral)  Ht 5\' 7"  (1.702 m)  Wt 259 lb 12.8 oz (117.845 kg)  BMI 40.68 kg/m2  SpO2 100%  LMP 04/15/2013  Physical Exam  Constitutional: She is oriented to person, place, and time and well-developed, well-nourished, and in no distress.  HENT:  Head: Normocephalic and atraumatic.  Eyes: Conjunctivae are normal.  Cardiovascular: Normal rate, regular rhythm, normal heart sounds and intact distal pulses.   Pulmonary/Chest: Effort normal and breath sounds normal. No respiratory distress. She has no wheezes. She has no rales. She exhibits no tenderness.  Musculoskeletal:       Right knee: Normal.       Left knee: Normal.       Right ankle: Normal.       Left ankle: Normal.       Right lower leg: She exhibits edema. She exhibits  no tenderness.       Left lower leg: She exhibits edema. She exhibits no tenderness.  Neurological: She is alert and oriented to person, place, and time.  Skin: Skin is warm and dry. No rash noted.  Vitals reviewed.   Recent Results (from the past 2160 hour(s))  POCT rapid strep A     Status: None   Collection Time: 12/26/14 11:57 AM  Result Value Ref Range   Rapid Strep A Screen Negative Negative    Assessment/Plan: Peripheral edema Symmetrical 1+. Rx Lasix 20 mg. Norco for severe pain.  Supportive and dietary measures discussed.  Follow-up 1 week.

## 2015-01-14 NOTE — Progress Notes (Signed)
Pre visit review using our clinic review tool, if applicable. No additional management support is needed unless otherwise documented below in the visit note. 

## 2015-01-14 NOTE — Patient Instructions (Signed)
Please take the Lasix as directed. Use norco for moderate pain -- weaning down to tylenol as pain and swelling reduce. Elevate legs while resting. Follow-up in 1 week.

## 2015-01-14 NOTE — Assessment & Plan Note (Addendum)
Symmetrical 1+. Rx Lasix 20 mg. Norco for severe pain.  Supportive and dietary measures discussed.  Follow-up 1 week.

## 2015-01-27 ENCOUNTER — Other Ambulatory Visit: Payer: Self-pay | Admitting: Physician Assistant

## 2015-01-27 NOTE — Telephone Encounter (Signed)
escitalopram (LEXAPRO) 10 MG tablet 30 tablet 0 12/23/2014      Sig: TAKE ONE TABLET BY MOUTH ONCE DAILY    Notes to Pharmacy: Requested drug refills are authorized, however, the patient needs further evaluation and/or laboratory testing before further refills are given. Ask her to make an appointment for this.    E-Prescribing Status: Receipt confirmed by pharmacy (12/23/2014 12:03 PM EDT)   Pharmacy    [facility], [place] was D/C on 06.01.16

## 2015-04-27 ENCOUNTER — Ambulatory Visit (INDEPENDENT_AMBULATORY_CARE_PROVIDER_SITE_OTHER): Payer: 59 | Admitting: Physician Assistant

## 2015-04-27 ENCOUNTER — Encounter: Payer: Self-pay | Admitting: Physician Assistant

## 2015-04-27 VITALS — BP 134/86 | HR 77 | Temp 97.7°F | Resp 16 | Ht 67.0 in | Wt 254.2 lb

## 2015-04-27 DIAGNOSIS — J019 Acute sinusitis, unspecified: Secondary | ICD-10-CM | POA: Diagnosis not present

## 2015-04-27 DIAGNOSIS — B9689 Other specified bacterial agents as the cause of diseases classified elsewhere: Secondary | ICD-10-CM | POA: Insufficient documentation

## 2015-04-27 MED ORDER — LORATADINE-PSEUDOEPHEDRINE ER 10-240 MG PO TB24: 1.0000 | ORAL_TABLET | Freq: Every day | ORAL | Status: DC

## 2015-04-27 MED ORDER — AZITHROMYCIN 250 MG PO TABS: ORAL_TABLET | ORAL | Status: DC

## 2015-04-27 NOTE — Patient Instructions (Signed)
Please take antibiotic as directed.  Increase fluid intake.  Use Saline nasal spray.  Take a daily multivitamin. Continue Claritin-D.  Place a humidifier in the bedroom.  Please call or return clinic if symptoms are not improving.  Sinusitis Sinusitis is redness, soreness, and swelling (inflammation) of the paranasal sinuses. Paranasal sinuses are air pockets within the bones of your face (beneath the eyes, the middle of the forehead, or above the eyes). In healthy paranasal sinuses, mucus is able to drain out, and air is able to circulate through them by way of your nose. However, when your paranasal sinuses are inflamed, mucus and air can become trapped. This can allow bacteria and other germs to grow and cause infection. Sinusitis can develop quickly and last only a short time (acute) or continue over a long period (chronic). Sinusitis that lasts for more than 12 weeks is considered chronic.  CAUSES  Causes of sinusitis include:  Allergies.  Structural abnormalities, such as displacement of the cartilage that separates your nostrils (deviated septum), which can decrease the air flow through your nose and sinuses and affect sinus drainage.  Functional abnormalities, such as when the small hairs (cilia) that line your sinuses and help remove mucus do not work properly or are not present. SYMPTOMS  Symptoms of acute and chronic sinusitis are the same. The primary symptoms are pain and pressure around the affected sinuses. Other symptoms include:  Upper toothache.  Earache.  Headache.  Bad breath.  Decreased sense of smell and taste.  A cough, which worsens when you are lying flat.  Fatigue.  Fever.  Thick drainage from your nose, which often is green and may contain pus (purulent).  Swelling and warmth over the affected sinuses. DIAGNOSIS  Your caregiver will perform a physical exam. During the exam, your caregiver may:  Look in your nose for signs of abnormal growths in your  nostrils (nasal polyps).  Tap over the affected sinus to check for signs of infection.  View the inside of your sinuses (endoscopy) with a special imaging device with a light attached (endoscope), which is inserted into your sinuses. If your caregiver suspects that you have chronic sinusitis, one or more of the following tests may be recommended:  Allergy tests.  Nasal culture A sample of mucus is taken from your nose and sent to a lab and screened for bacteria.  Nasal cytology A sample of mucus is taken from your nose and examined by your caregiver to determine if your sinusitis is related to an allergy. TREATMENT  Most cases of acute sinusitis are related to a viral infection and will resolve on their own within 10 days. Sometimes medicines are prescribed to help relieve symptoms (pain medicine, decongestants, nasal steroid sprays, or saline sprays).  However, for sinusitis related to a bacterial infection, your caregiver will prescribe antibiotic medicines. These are medicines that will help kill the bacteria causing the infection.  Rarely, sinusitis is caused by a fungal infection. In theses cases, your caregiver will prescribe antifungal medicine. For some cases of chronic sinusitis, surgery is needed. Generally, these are cases in which sinusitis recurs more than 3 times per year, despite other treatments. HOME CARE INSTRUCTIONS   Drink plenty of water. Water helps thin the mucus so your sinuses can drain more easily.  Use a humidifier.  Inhale steam 3 to 4 times a day (for example, sit in the bathroom with the shower running).  Apply a warm, moist washcloth to your face 3 to 4 times  a day, or as directed by your caregiver.  Use saline nasal sprays to help moisten and clean your sinuses.  Take over-the-counter or prescription medicines for pain, discomfort, or fever only as directed by your caregiver. SEEK IMMEDIATE MEDICAL CARE IF:  You have increasing pain or severe  headaches.  You have nausea, vomiting, or drowsiness.  You have swelling around your face.  You have vision problems.  You have a stiff neck.  You have difficulty breathing. MAKE SURE YOU:   Understand these instructions.  Will watch your condition.  Will get help right away if you are not doing well or get worse. Document Released: 08/01/2005 Document Revised: 10/24/2011 Document Reviewed: 08/16/2011 ExitCare Patient Information 2014 [employer].   

## 2015-04-27 NOTE — Assessment & Plan Note (Signed)
Rx Azithromycin as patient tolerates well.  Increase fluids.  Rest.  Saline nasal spray.  Probiotic.  Mucinex as directed.  Humidifier in bedroom. Claritin-D.  Call or return to clinic if symptoms are not improving.

## 2015-04-27 NOTE — Progress Notes (Signed)
History of Present Illness: Alexandra Mata is a 49 y.o. female who present to the clinic today complaining of sinus pressure, sinus pain and nasal congestion. Patient endorses Ear pressure and ringing. Endorses facial pain.  Patient denies chest pain, SOB, fever or chills. Endorses overall malaise.  Has been taking Claritin-D with some improvement in symptoms.   History: Past Medical History  Diagnosis Date  . History of chicken pox   . Seizures     Pt states she had a couple of seizures in high school of undetermined cause  . Asymptomatic varicose veins   . Leg swelling   . Obesity   . Edema   . Unspecified sinusitis (chronic)   . Anemia 05/17/2011    Current outpatient prescriptions:  .  escitalopram (LEXAPRO) 10 MG tablet, TAKE 1 TABLET EVERY DAY ( PATIENT NEEDS APPOINTMENT), Disp: 30 tablet, Rfl: 1 .  furosemide (LASIX) 20 MG tablet, Take 1 tablet (20 mg total) by mouth daily., Disp: 30 tablet, Rfl: 3 .  ibuprofen (ADVIL,MOTRIN) 200 MG tablet, Take 200 mg by mouth every 6 (six) hours as needed. , Disp: , Rfl:  .  azithromycin (ZITHROMAX) 250 MG tablet, Take 2 tablets on Day 1. Then take 1 tablet daily., Disp: 6 tablet, Rfl: 0 .  loratadine-pseudoephedrine (CLARITIN-D 24 HOUR) 10-240 MG per 24 hr tablet, Take 1 tablet by mouth daily., Disp: 60 tablet, Rfl: 0 No Known Allergies Family History  Problem Relation Age of Onset  . Arthritis Mother   . Hypertension Mother   . Diabetes Mother   . Asthma Mother   . Hypertension Father   . Diabetes Maternal Grandmother   . Hypertension Maternal Grandmother   . Heart disease Maternal Grandmother   . Diabetes Paternal Grandmother   . Cancer Paternal Grandfather     lung  . Coronary artery disease Brother    Social History   Social History  . Marital Status: Married    Spouse Name: N/A  . Number of Children: 6  . Years of Education: N/A   Social History Main Topics  . Smoking status: Never Smoker   . Smokeless tobacco: Never  Used  . Alcohol Use: No  . Drug Use: None  . Sexual Activity: Not Asked   Other Topics Concern  . None   Social History Narrative   Regular exercise:  No   Caffeine Use:  No   3 biological children, 3 [place]- one child aged [place]- Daughter Larene Beach (three children ages 61, 74 and 23)      53- denies   Never smoked.   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      Married to [employer]    Review of Systems: See HPI.  All other ROS are negative.  Physical Examination: BP 134/86 mmHg  Pulse 77  Temp(Src) 97.7 F (36.5 C) (Oral)  Resp 16  Ht 5\' 7"  (1.702 m)  Wt 254 lb 4 oz (115.327 kg)  BMI 39.81 kg/m2  SpO2 98%  LMP 04/15/2013  General appearance: alert, cooperative and appears stated age Head: Normocephalic, without obvious abnormality, atraumatic, sinuses tender to percussion Eyes: conjunctivae/corneas clear. PERRL, EOM's intact. Fundi benign. Ears: normal TM's and external ear canals both ears Nose: moderate congestion, turbinates swollen, sinus tenderness bilateral Throat: lips, mucosa, and tongue normal; teeth and gums normal Neck:  no adenopathy, no carotid bruit, no JVD, supple, symmetrical, trachea midline and thyroid not enlarged, symmetric, no tenderness/mass/nodules Lungs: clear to auscultation bilaterally Chest wall: no tenderness  Assessment/Plan: Acute bacterial sinusitis Rx Azithromycin as patient tolerates well.  Increase fluids.  Rest.  Saline nasal spray.  Probiotic.  Mucinex as directed.  Humidifier in bedroom. Claritin-D.  Call or return to clinic if symptoms are not improving.

## 2015-05-07 ENCOUNTER — Other Ambulatory Visit: Payer: Self-pay | Admitting: Physician Assistant

## 2015-05-07 NOTE — Telephone Encounter (Signed)
Last filled: 01/27/15 Amt: 30, 1  Last OV:  04/27/15 (acute visit)  Pt needs an appointment.  Left a message for call back.

## 2015-05-12 ENCOUNTER — Encounter: Payer: Self-pay | Admitting: Family Medicine

## 2015-05-12 ENCOUNTER — Ambulatory Visit (INDEPENDENT_AMBULATORY_CARE_PROVIDER_SITE_OTHER): Payer: 59 | Admitting: Family Medicine

## 2015-05-12 VITALS — BP 121/78 | HR 76 | Temp 97.6°F | Resp 18 | Ht 67.0 in | Wt 255.0 lb

## 2015-05-12 DIAGNOSIS — J019 Acute sinusitis, unspecified: Secondary | ICD-10-CM | POA: Diagnosis not present

## 2015-05-12 DIAGNOSIS — I319 Disease of pericardium, unspecified: Secondary | ICD-10-CM | POA: Insufficient documentation

## 2015-05-12 DIAGNOSIS — R079 Chest pain, unspecified: Secondary | ICD-10-CM

## 2015-05-12 DIAGNOSIS — M549 Dorsalgia, unspecified: Secondary | ICD-10-CM | POA: Diagnosis not present

## 2015-05-12 DIAGNOSIS — R059 Cough, unspecified: Secondary | ICD-10-CM

## 2015-05-12 DIAGNOSIS — M546 Pain in thoracic spine: Secondary | ICD-10-CM | POA: Diagnosis not present

## 2015-05-12 DIAGNOSIS — B9689 Other specified bacterial agents as the cause of diseases classified elsewhere: Secondary | ICD-10-CM

## 2015-05-12 DIAGNOSIS — R05 Cough: Secondary | ICD-10-CM

## 2015-05-12 LAB — POCT URINALYSIS DIPSTICK
Bilirubin, UA: NEGATIVE
GLUCOSE UA: NEGATIVE
KETONES UA: NEGATIVE
Leukocytes, UA: NEGATIVE
Nitrite, UA: NEGATIVE
PROTEIN UA: NEGATIVE
RBC UA: NEGATIVE
Urobilinogen, UA: 0.2
pH, UA: 5.5

## 2015-05-12 MED ORDER — DOXYCYCLINE HYCLATE 100 MG PO TABS: 100.0000 mg | ORAL_TABLET | Freq: Two times a day (BID) | ORAL | Status: DC

## 2015-05-12 MED ORDER — TRAMADOL HCL 50 MG PO TABS: 50.0000 mg | ORAL_TABLET | Freq: Three times a day (TID) | ORAL | Status: DC | PRN

## 2015-05-12 NOTE — Progress Notes (Signed)
Pre visit review using our clinic review tool, if applicable. No additional management support is needed unless otherwise documented below in the visit note. 

## 2015-05-12 NOTE — Patient Instructions (Signed)
We will call you once we receive your results, Take the antibiotic until completed.  If your symptoms worsen please be seen in ED.

## 2015-05-12 NOTE — Progress Notes (Signed)
Subjective:    Patient ID: Alexandra Mata, female    DOB: [birth year], 49 y.o.   MRN: [id number]  HPI CC: Back pain  Back pain: Patient presents for acute visit with the complaint of thoracic back pain, between her shoulder blades that started about one half weeks ago. She reports that on Sunday she felt that it "moved to her chest ". She points to the center of her chest, without radiation. Patient states this morning she could not lay down because she felt like someone was "squeezing "in her chest. She reports the pain eases up a little bit if she sits up. She endorses fevers, chills, diaphoresis, fatigue, facial pressure, headaches, back pain between her shoulder blades, chest pain, cough, congestion, rhinorrhea, shortness breath, chest tightness, bilateral leg swelling, and dysuria with frequency. Patient reports she is just recently been prescribed azithromycin, which she finished last week for a sinus infection in which she felt like she never completely recovered. She has been taking Advil daily for the last few days at at least 800 mg 3 times a day. She states the pain were core up in the middle of the night and she has to take Advil, and it did not work for her. Patient has never been a smoker, reports a history of chronic bronchitis and pneumonia. Patient has taken tramadol in the past, without side effects. She does have a remote history of seizures in high school which she states they thought was anxiety related and had full negative neurological workup.  Never smoker  Past Medical History  Diagnosis Date  . History of chicken pox   . Seizures     Pt states she had a couple of seizures in high school of undetermined cause  . Asymptomatic varicose veins   . Leg swelling   . Obesity   . Edema   . Unspecified sinusitis (chronic)   . Anemia 05/17/2011   No Known Allergies  Family History  Problem Relation Age of Onset  . Arthritis Mother   . Hypertension Mother   . Diabetes  Mother   . Asthma Mother   . Hypertension Father   . Diabetes Maternal Grandmother   . Hypertension Maternal Grandmother   . Heart disease Maternal Grandmother   . Diabetes Paternal Grandmother   . Cancer Paternal Grandfather     lung  . Coronary artery disease Brother      Review of Systems  Constitutional: Positive for fever, chills, diaphoresis, activity change and fatigue. Negative for appetite change and unexpected weight change.  HENT: Positive for congestion, postnasal drip, rhinorrhea and sinus pressure. Negative for ear pain, facial swelling, hearing loss, nosebleeds, tinnitus, trouble swallowing and voice change.   Eyes: Negative for pain, discharge and visual disturbance.  Respiratory: Positive for cough, chest tightness and shortness of breath. Negative for choking and wheezing.   Cardiovascular: Positive for chest pain and leg swelling. Negative for palpitations.  Gastrointestinal: Negative for vomiting, abdominal pain, diarrhea and abdominal distention.  Genitourinary: Positive for dysuria and frequency. Negative for vaginal pain.  Musculoskeletal: Positive for back pain. Negative for joint swelling.  Skin: Negative for rash.  Neurological: Positive for headaches. Negative for dizziness, tremors, seizures, syncope, speech difficulty, weakness, light-headedness and numbness.  Hematological: Negative for adenopathy.  Psychiatric/Behavioral: The patient is not nervous/anxious.       Objective:   Physical Exam BP 121/78 mmHg  Pulse 76  Temp(Src) 97.6 F (36.4 C) (Temporal)  Resp 18  Ht _0  (1.702  m)  Wt 255 lb (115.667 kg)  BMI 39.93 kg/m2  SpO2 100%  LMP 04/15/2013 Gen: Afebrile. No acute distress. Nontoxic in appearance, appears uncomfortable. Caucasian female, obese. HENT: AT. [place]. Bilateral TM visualized and normal in appearance. MMM. Bilateral nares with mild erythema and swelling. Throat without erythema or exudates. Bilateral frontal maxillary tenderness to  palpation. Eyes:Pupils Equal Round Reactive to light, Extraocular movements intact,  Conjunctiva without redness, discharge or icterus. Neck/lymp/endocrine: Supple, mild cervical lymphadenopathy, no thyromegaly CV: RRR no murmurs appreciated, trace edema, +2/4 P posterior tibialis pulses Chest: CTAB, no wheeze or crackles Abd: Soft. Obese. NTND. BS present. No Masses palpated.  MSK: No erythema, no edema, no bruising.  Discomfort between shoulder blades when laying flat. Discomfort in chest when lying flat. No tenderness to palpation chest wall. Discomfort with deep breath.Mild bony tenderness to palpation mid thoracic spine. No paraspinal fullness. Skin: No rashes, purpura or petechiae.  Neuro: Normal gait. PERLA. EOMi. Alert. Oriented x3  Psych: Normal affect, dress and demeanor. Normal speech. Normal thought content and judgment.  EKG: NSR, HR 75, T-wave inversion III, V1. No ST changes. No Q waves.     Assessment & Plan:  1. Back pain, unspecified location - With complaints of dysuria and frequency completed UA which was negative. - POCT urinalysis dipstick: Negative white blood cell, negative nitrites, negative protein, negative ketones, negative blood  2. Chest pain, unspecified chest pain type, cough - EKG per above, with agents presentation history consider differential diagnosis of pleurisy, pericarditis, pneumonia, discitis/infection.  - Patient reports self treatment with Advil 800 mg 3 times a day, would have expected pleurisy or pericarditis to have improved on that treatment. Concern with fever or chills and diaphoresis. Afebrile in the office, however had just taken ibuprofen. - Chest pain was not reproducible to palpation, but was reproducible to position. - Chest x-ray, thoracic x-ray and echo ordered. - EKG 12-Lead - Echocardiogram; Future - CBC,  ESR future  3. Midline thoracic back pain, moderate to severe - See above under chest pain - DG Thoracic Spine W/Swimmers;  Future - DG Chest 2 View; Future - traMADol (ULTRAM) 50 MG tablet; Take 1 tablet (50 mg total) by mouth every 8 (eight) hours as needed.  Dispense: 30 tablet; Refill: 0  4. Acute bacterial sinusitis - Patient still with sinus infection, prior treatment azithromycin, will increase coverage with doxycycline twice a day for 10 days. - doxycycline (VIBRA-TABS) 100 MG tablet; Take 1 tablet (100 mg total) by mouth 2 (two) times daily.  Dispense: 20 tablet; Refill: 0   Follow-up depending upon results of imaging.

## 2015-05-15 ENCOUNTER — Telehealth: Payer: Self-pay | Admitting: Physician Assistant

## 2015-05-15 ENCOUNTER — Ambulatory Visit
Admission: RE | Admit: 2015-05-15 | Discharge: 2015-05-15 | Disposition: A | Discharge status: Home / Self Care | Payer: 59 | Source: Ambulatory Visit | Attending: Family Medicine | Admitting: Family Medicine

## 2015-05-15 ENCOUNTER — Telehealth: Payer: Self-pay | Admitting: Family Medicine

## 2015-05-15 ENCOUNTER — Ambulatory Visit (HOSPITAL_COMMUNITY): Payer: 59

## 2015-05-15 DIAGNOSIS — M546 Pain in thoracic spine: Secondary | ICD-10-CM

## 2015-05-15 NOTE — Telephone Encounter (Signed)
LMOM for pt to CB.  

## 2015-05-15 NOTE — Telephone Encounter (Signed)
[facility] Day - Client TELEPHONE ADVICE RECORD [facility] Patient Name: Alexandra Mata DOB: 04-02-1966 Initial Comment Caller States she is having chest pains. Nurse Assessment Nurse: Mechele Dawley, RN, Amy Date/Time Eilene Ghazi Time): 05/15/2015 3:55:27 PM Confirm and document reason for call. If symptomatic, describe symptoms. ---CALLER STATES SHE HAS BEEN HAVING CHEST PAINS FOR A WEEK NOW. IT IS A SQUEEZING SENSATION. SHE TRIED TO GET FOR AN APPT - SHE WAS SENT INTO [facility]. SHE WAS GIVEN SOME ANTIBIOTIC IN CASE OF PNEUMONIA. SHE ORDERED CHEST XRAYS - ALL OKAY. LUNGS ARE CLEAR. SHE WAS ALSO ORDERED ECHO AND NOT ABLE TO GET IN ANYWHERE. SHE WAS SUPPOSED TO GET INTO PCP. IT HAS NOT STOPPED BOTHERING HER. CONSTANT SQUEEZING. SHE WAS GIVEN SOME TRAMADOL FOR PAIN. SHE CAN ONLY TAKE IT AT NIGHT, BARELY TAKES THE PAIN AWAY. 8/10 ON PAIN SCALE WITH THE SQUEEZING IT DOES GET TO A 10/10 AT TIMES. Has the patient traveled out of the country within the last 30 days? ---Not Applicable Does the patient require triage? ---Yes Related visit to physician within the last 2 weeks? ---Yes Does the PT have any chronic conditions? (i.e. diabetes, asthma, etc.) ---No Did the patient indicate they were pregnant? ---No Guidelines Guideline Title Affirmed Question Affirmed Notes Chest Pain SEVERE chest pain MID CHEST PAIN Final Disposition User Go to ED Now [place], RN, Amy Referrals GO TO FACILITY OTHER - SPECIFY Disagree/Comply: Comply

## 2015-05-15 NOTE — Telephone Encounter (Signed)
Please call patient: Her chest x-ray was normal, no concerns for infection. Her back x-ray showed mild degenerative changes, but no acute processes that would have explained her pain. If she is not improving, or symptoms are worsening I would advise her to follow-up with her primary care physician.

## 2015-05-15 NOTE — Telephone Encounter (Signed)
Patient aware of results and f/u instructions.  Pt had no questions at this time.

## 2015-05-19 NOTE — Telephone Encounter (Signed)
Called to follow up.  Left a message for call back.   

## 2015-05-22 ENCOUNTER — Encounter: Payer: Self-pay | Admitting: Physician Assistant

## 2015-05-22 ENCOUNTER — Encounter (INDEPENDENT_AMBULATORY_CARE_PROVIDER_SITE_OTHER): Payer: Self-pay

## 2015-05-22 ENCOUNTER — Ambulatory Visit (INDEPENDENT_AMBULATORY_CARE_PROVIDER_SITE_OTHER): Payer: 59 | Admitting: Physician Assistant

## 2015-05-22 VITALS — BP 132/81 | HR 72 | Temp 97.9°F | Resp 16 | Ht 67.0 in | Wt 255.4 lb

## 2015-05-22 DIAGNOSIS — I319 Disease of pericardium, unspecified: Secondary | ICD-10-CM

## 2015-05-22 MED ORDER — MELOXICAM 15 MG PO TABS: 15.0000 mg | ORAL_TABLET | Freq: Every day | ORAL | Status: DC

## 2015-05-22 MED ORDER — TRAMADOL HCL 50 MG PO TABS: 50.0000 mg | ORAL_TABLET | Freq: Three times a day (TID) | ORAL | Status: DC | PRN

## 2015-05-22 NOTE — Patient Instructions (Signed)
Please take Mobic daily with food for inflammation and pain. Continue Tramadol as directed. Symptoms should continue to improve. You will be contacted by Cardiology for further discussion of your echo and recent hospitalization.  If anything worsens, please go to the ER or call 911.

## 2015-05-23 ENCOUNTER — Emergency Department (HOSPITAL_COMMUNITY)
Admission: EM | Admit: 2015-05-23 | Discharge: 2015-05-23 | Disposition: A | Discharge status: Home / Self Care | Payer: 59 | Attending: Emergency Medicine | Admitting: Emergency Medicine

## 2015-05-23 ENCOUNTER — Emergency Department (HOSPITAL_COMMUNITY): Payer: 59

## 2015-05-23 ENCOUNTER — Encounter (HOSPITAL_COMMUNITY): Payer: Self-pay | Admitting: Emergency Medicine

## 2015-05-23 DIAGNOSIS — M7989 Other specified soft tissue disorders: Secondary | ICD-10-CM | POA: Insufficient documentation

## 2015-05-23 DIAGNOSIS — R609 Edema, unspecified: Secondary | ICD-10-CM | POA: Insufficient documentation

## 2015-05-23 DIAGNOSIS — R0602 Shortness of breath: Secondary | ICD-10-CM | POA: Diagnosis not present

## 2015-05-23 DIAGNOSIS — R079 Chest pain, unspecified: Secondary | ICD-10-CM | POA: Diagnosis not present

## 2015-05-23 DIAGNOSIS — Z862 Personal history of diseases of the blood and blood-forming organs and certain disorders involving the immune mechanism: Secondary | ICD-10-CM | POA: Insufficient documentation

## 2015-05-23 DIAGNOSIS — Z79899 Other long term (current) drug therapy: Secondary | ICD-10-CM | POA: Diagnosis not present

## 2015-05-23 DIAGNOSIS — Z8619 Personal history of other infectious and parasitic diseases: Secondary | ICD-10-CM | POA: Insufficient documentation

## 2015-05-23 DIAGNOSIS — Z792 Long term (current) use of antibiotics: Secondary | ICD-10-CM | POA: Diagnosis not present

## 2015-05-23 DIAGNOSIS — R110 Nausea: Secondary | ICD-10-CM | POA: Insufficient documentation

## 2015-05-23 DIAGNOSIS — E669 Obesity, unspecified: Secondary | ICD-10-CM | POA: Insufficient documentation

## 2015-05-23 DIAGNOSIS — Z8709 Personal history of other diseases of the respiratory system: Secondary | ICD-10-CM | POA: Diagnosis not present

## 2015-05-23 LAB — CBC
HEMATOCRIT: 39.5 % (ref 36.0–46.0)
Hemoglobin: 12.4 g/dL (ref 12.0–15.0)
MCH: 28.4 pg (ref 26.0–34.0)
MCHC: 31.4 g/dL (ref 30.0–36.0)
MCV: 90.4 fL (ref 78.0–100.0)
Platelets: 226 10*3/uL (ref 150–400)
RBC: 4.37 MIL/uL (ref 3.87–5.11)
RDW: 13.6 % (ref 11.5–15.5)
WBC: 9.3 10*3/uL (ref 4.0–10.5)

## 2015-05-23 LAB — BASIC METABOLIC PANEL
Anion gap: 8 (ref 5–15)
BUN: 23 mg/dL — AB (ref 6–20)
CHLORIDE: 107 mmol/L (ref 101–111)
CO2: 25 mmol/L (ref 22–32)
Calcium: 8.8 mg/dL — ABNORMAL LOW (ref 8.9–10.3)
Creatinine, Ser: 0.58 mg/dL (ref 0.44–1.00)
GFR calc non Af Amer: >60 mL/min (ref >60)
Glucose, Bld: 101 mg/dL — ABNORMAL HIGH (ref 65–99)
POTASSIUM: 4.3 mmol/L (ref 3.5–5.1)
SODIUM: 140 mmol/L (ref 135–145)

## 2015-05-23 LAB — LIPASE, BLOOD: Lipase: 29 U/L (ref 22–51)

## 2015-05-23 LAB — I-STAT TROPONIN, ED: Troponin i, poc: 0 ng/mL (ref 0.00–0.08)

## 2015-05-23 MED ORDER — MORPHINE SULFATE (PF) 4 MG/ML IV SOLN: 4.0000 mg | Freq: Once | INTRAVENOUS | Status: DC

## 2015-05-23 MED ORDER — HYDROCODONE-ACETAMINOPHEN 5-325 MG PO TABS
1.0000 | ORAL_TABLET | Freq: Once | ORAL | Status: AC
  Administered 2015-05-23: 1 via ORAL
  Filled 2015-05-23: qty 1

## 2015-05-23 MED ORDER — GI COCKTAIL ~~LOC~~
30.0000 mL | Freq: Once | ORAL | Status: AC
  Administered 2015-05-23: 30 mL via ORAL
  Filled 2015-05-23: qty 30

## 2015-05-23 NOTE — ED Notes (Signed)
Pt states diagnosed last Friday with pericarditis. Pt states two weeks she started noticing pain in chest. Pt reports today pain radiating down right arm and up through right side of neck. Associated with SOB, light headedness, and nausea. Pt denies V/D Pt A/O x4.

## 2015-05-23 NOTE — ED Provider Notes (Signed)
CSN: [id number]     Arrival date & time 05/23/15  1630 History   First MD Initiated Contact with Patient 05/23/15 1708     Chief Complaint  Patient presents with  . Chest Pain    Patient is a 49 y.o. female presenting with chest pain. The history is provided by the patient and medical records.  Chest Pain Pain location:  L chest Pain quality: pressure   Pain radiates to:  Upper back, R arm and neck Pain severity:  Moderate Onset quality:  Gradual Duration:  3 days Timing:  Constant Progression:  Worsening Chronicity:  New Context comment:  Similar episode 1 wk ago, dx with pericarditis after extensive workup at outside hospital Exacerbated by: lying flat. Ineffective treatments: NSAIDs (ASA, ibuprofen, meloxicam) Associated symptoms: lower extremity edema (chronic, states due to valvular insufficiency), nausea and shortness of breath   Associated symptoms: no abdominal pain, no cough, no fatigue, no fever and not vomiting     Past Medical History  Diagnosis Date  . History of chicken pox   . Seizures ([place])     Pt states she had a couple of seizures in high school of undetermined cause  . Asymptomatic varicose veins   . Leg swelling   . Obesity   . Edema   . Unspecified sinusitis (chronic)   . Anemia 05/17/2011   Past Surgical History  Procedure Laterality Date  . Tubal ligation  1991    `   Family History  Problem Relation Age of Onset  . Arthritis Mother   . Hypertension Mother   . Diabetes Mother   . Asthma Mother   . Hypertension Father   . Diabetes Maternal Grandmother   . Hypertension Maternal Grandmother   . Heart disease Maternal Grandmother   . Diabetes Paternal Grandmother   . Cancer Paternal Grandfather     lung  . Coronary artery disease Brother    Social History  Substance Use Topics  . Smoking status: Never Smoker   . Smokeless tobacco: Never Used  . Alcohol Use: No   OB History    No data available     Review of Systems  Constitutional:  Negative for fever and fatigue.  HENT: Negative for rhinorrhea.   Eyes: Negative for visual disturbance.  Respiratory: Positive for shortness of breath. Negative for cough.   Cardiovascular: Positive for chest pain and leg swelling.  Gastrointestinal: Positive for nausea. Negative for vomiting and abdominal pain.  Genitourinary: Negative for decreased urine volume.  Skin: Negative for rash.  Allergic/Immunologic: Negative for immunocompromised state.  Neurological: Negative for syncope.  Psychiatric/Behavioral: Negative for confusion.    Allergies  Review of patient's allergies indicates no known allergies.  Home Medications   Prior to Admission medications   Medication Sig Start Date End Date Taking? Authorizing Provider  doxycycline (VIBRA-TABS) 100 MG tablet Take 1 tablet (100 mg total) by mouth 2 (two) times daily. 05/12/15  Yes Renee A Kuneff, DO  escitalopram (LEXAPRO) 10 MG tablet Take 1 tablet (10 mg total) by mouth daily. 05/07/15  Yes Brunetta Jeans, PA-C  furosemide (LASIX) 20 MG tablet Take 1 tablet (20 mg total) by mouth daily. 01/14/15  Yes Brunetta Jeans, PA-C  ibuprofen (ADVIL,MOTRIN) 200 MG tablet Take 200 mg by mouth every 6 (six) hours as needed for moderate pain.    Yes Historical Provider, MD  loratadine-pseudoephedrine (CLARITIN-D 24 HOUR) 10-240 MG per 24 hr tablet Take 1 tablet by mouth daily. 04/27/15  Yes Gwyndolyn Saxon  Daun Peacock, PA-C  meloxicam (MOBIC) 15 MG tablet Take 1 tablet (15 mg total) by mouth daily. 05/22/15  Yes Brunetta Jeans, PA-C  Multiple Vitamin (MULTIVITAMIN WITH MINERALS) TABS tablet Take 1 tablet by mouth daily.   Yes Historical Provider, MD  omeprazole (PRILOSEC) 20 MG capsule Take 20 mg by mouth daily.   Yes Historical Provider, MD  traMADol (ULTRAM) 50 MG tablet Take 1 tablet (50 mg total) by mouth every 8 (eight) hours as needed. 05/22/15  Yes Brunetta Jeans, PA-C   BP 132/80 mmHg  Pulse 74  Temp(Src) 98.4 F (36.9 C) (Oral)  Resp 14  Ht  5\' 7"  (1.702 m)  Wt 250 lb (113.399 kg)  BMI 39.15 kg/m2  SpO2 97%  LMP 04/15/2013 Physical Exam  Constitutional: She is oriented to person, place, and time. She appears well-developed and well-nourished. No distress.  HENT:  Head: Normocephalic and atraumatic.  Eyes: Right eye exhibits no discharge. Left eye exhibits no discharge.  Neck: No tracheal deviation present.  Cardiovascular: Normal rate and regular rhythm.   Pulmonary/Chest: Effort normal and breath sounds normal. No respiratory distress. She exhibits tenderness (entire chest L>R and just under ribs at border of epigastric area).  Abdominal: Soft. She exhibits no distension. There is no tenderness.  Musculoskeletal: She exhibits edema (bilat non pitting LE edema, symmetric, pt states chronic).  Neurological: She is alert and oriented to person, place, and time.  Skin: Skin is warm and dry.  Psychiatric: She has a normal mood and affect. Her behavior is normal.  Nursing note and vitals reviewed.   ED Course  Procedures (including critical care time) Labs Review Labs Reviewed  BASIC METABOLIC PANEL - Abnormal; Notable for the following:    Glucose, Bld 101 (*)    BUN 23 (*)    Calcium 8.8 (*)    All other components within normal limits  CBC  LIPASE, BLOOD  I-STAT TROPOININ, ED    Imaging Review Dg Chest 2 View  05/23/2015   CLINICAL DATA:  Patient with history of pericarditis. Chest pain. Shortness of breath.  EXAM: CHEST  2 VIEW  COMPARISON:  Chest radiograph 05/15/2015  FINDINGS: Stable cardiac and mediastinal contours. No consolidative pulmonary opacities. No pleural effusion or pneumothorax. Regional skeleton is unremarkable.  IMPRESSION: No acute cardiopulmonary process.   Electronically Signed   By: Lovey Newcomer M.D.   On: 05/23/2015 18:23   I have personally reviewed and evaluated these images and lab results as part of my medical decision-making.   EKG Interpretation   Date/Time:  Saturday May 23 2015  16:35:44 EDT Ventricular Rate:  69 PR Interval:  138 QRS Duration: 94 QT Interval:  406 QTC Calculation: 435 R Axis:   -6 Text Interpretation:  Normal sinus rhythm Normal ECG Confirmed by ZAVITZ   MD, JOSHUA ([id number]) on 05/23/2015 5:17:14 PM      MDM   Final diagnoses:  Chest pain, unspecified chest pain type   48 year old female with history of obesity, bilateral lower extremity swelling, anxiety, GERD, recent diagnosis of pericarditis presenting with chest pain. AF, VSS. Outside records reviewed- had recent extensive workup last week for similar presentation with neg findings aside from pericarditis; also had neg bilat DVT ultrasound. In setting of this feel pt's pain likely atypical CP vs costochondritis. Trop neg after several days and no EKG changes, doubt ACS / ischemia or myocarditis. CXR neg for PNA or PTX. Lipase neg, story less concerning for GI cause and pt already on  PPI for GERD.  DC in stable condition with return precautions, outpt f/u.   Case discussed with Dr. Reather Converse who oversaw management of this patient.      Ivin Booty, MD 05/24/15 [id number]  Elnora Morrison, MD 05/27/15 2012

## 2015-05-26 NOTE — Assessment & Plan Note (Signed)
Resolving. Rx Mobic daily. Continue PPI. Cardiology follow-up scheduled for further assessment. Alarm signs/symptoms discussed with patient prompting return to ER.

## 2015-05-26 NOTE — Progress Notes (Signed)
Patient presents to clinic today for hospital follow-up of pericarditis. Patient admitted to [facility] after presenting to ER with chest pain. Cardiac workup negative. Symptoms thought to be post-infectious pericarditis giving improvement of chest pain with siting and exacerbation of pain while laying down. Patient discharged on omeprazole daily and Ibuprofen for pain relief. Instructed to follow-up with PCP.  Patient endorses symptoms are improving daily. Is having some mild pain with laying down presently but otherwise is feeling fine. Denies palpitations or SOB. Is taking Omeprazole without any reflux symptoms.  Past Medical History  Diagnosis Date  . History of chicken pox   . Seizures ([facility])     Pt states she had a couple of seizures in high school of undetermined cause  . Asymptomatic varicose veins   . Leg swelling   . Obesity   . Edema   . Unspecified sinusitis (chronic)   . Anemia 05/17/2011    Current Outpatient Prescriptions on File Prior to Visit  Medication Sig Dispense Refill  . doxycycline (VIBRA-TABS) 100 MG tablet Take 1 tablet (100 mg total) by mouth 2 (two) times daily. 20 tablet 0  . escitalopram (LEXAPRO) 10 MG tablet Take 1 tablet (10 mg total) by mouth daily. 30 tablet 5  . furosemide (LASIX) 20 MG tablet Take 1 tablet (20 mg total) by mouth daily. 30 tablet 3  . ibuprofen (ADVIL,MOTRIN) 200 MG tablet Take 200 mg by mouth every 6 (six) hours as needed for moderate pain.     Marland Kitchen loratadine-pseudoephedrine (CLARITIN-D 24 HOUR) 10-240 MG per 24 hr tablet Take 1 tablet by mouth daily. 60 tablet 0   No current facility-administered medications on file prior to visit.    No Known Allergies  Family History  Problem Relation Age of Onset  . Arthritis Mother   . Hypertension Mother   . Diabetes Mother   . Asthma Mother   . Hypertension Father   . Diabetes Maternal Grandmother   . Hypertension Maternal Grandmother   . Heart disease Maternal Grandmother     . Diabetes Paternal Grandmother   . Cancer Paternal Grandfather     lung  . Coronary artery disease Brother     Social History   Social History  . Marital Status: Married    Spouse Name: N/A  . Number of Children: 6  . Years of Education: N/A   Social History Main Topics  . Smoking status: Never Smoker   . Smokeless tobacco: Never Used  . Alcohol Use: No  . Drug Use: No  . Sexual Activity: Yes   Other Topics Concern  . None   Social History Narrative   Regular exercise:  No   Caffeine Use:  No   3 biological children, 3 stepchildren      21  [place]- one child aged 2   61- Tresa Endo   5- Daughter [name] (three children ages 75, 27 and 40)      23- denies   Never smoked.   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      Married to [employer]    Review of Systems - See HPI.  All other ROS are negative.  BP 132/81 mmHg  Pulse 72  Temp(Src) 97.9 F (36.6 C) (Oral)  Resp 16  Ht 5\' 7"  (1.702 m)  Wt 255 lb 6 oz (115.837 kg)  BMI 39.99 kg/m2  SpO2 100%  LMP 04/15/2013  Physical  Exam  Constitutional: She is oriented to person, place, and time and well-developed, well-nourished, and in no distress.  HENT:  Head: Normocephalic and atraumatic.  Eyes: Conjunctivae are normal.  Cardiovascular: Normal rate, regular rhythm, normal heart sounds and intact distal pulses.   Pulmonary/Chest: Effort normal and breath sounds normal. No respiratory distress. She has no wheezes. She has no rales. She exhibits no tenderness.  Neurological: She is alert and oriented to person, place, and time.  Skin: Skin is warm and dry. No rash noted.  Psychiatric: Affect normal.  Vitals reviewed.   Recent Results (from the past 2160 hour(s))  POCT urinalysis dipstick     Status: None   Collection Time: 05/12/15  3:14 PM  Result Value Ref Range   Color, UA yellow    Clarity, UA clear    Glucose, UA negative    Bilirubin,  UA negative    Ketones, UA negative    Spec Grav, UA >=1.030    Blood, UA negative    pH, UA 5.5    Protein, UA negative    Urobilinogen, UA 0.2    Nitrite, UA negative    Leukocytes, UA Negative Negative  Basic metabolic panel     Status: Abnormal   Collection Time: 05/23/15  5:13 PM  Result Value Ref Range   Sodium 140 135 - 145 mmol/L   Potassium 4.3 3.5 - 5.1 mmol/L   Chloride 107 101 - 111 mmol/L   CO2 25 22 - 32 mmol/L   Glucose, Bld 101 (H) 65 - 99 mg/dL   BUN 23 (H) 6 - 20 mg/dL   Creatinine, Ser 0.58 0.44 - 1.00 mg/dL   Calcium 8.8 (L) 8.9 - 10.3 mg/dL   GFR calc non Af Amer >60 >60 mL/min   GFR calc Af Amer >60 >60 mL/min    Comment: (NOTE) The eGFR has been calculated using the CKD EPI equation. This calculation has not been validated in all clinical situations. eGFR's persistently <60 mL/min signify possible Chronic Kidney Disease. CORRECTED ON 10/08 AT 1811: PREVIOUSLY REPORTED AS >60    Anion gap 8 5 - 15  CBC     Status: None   Collection Time: 05/23/15  5:13 PM  Result Value Ref Range   WBC 9.3 4.0 - 10.5 K/uL   RBC 4.37 3.87 - 5.11 MIL/uL   Hemoglobin 12.4 12.0 - 15.0 g/dL   HCT 39.5 36.0 - 46.0 %   MCV 90.4 78.0 - 100.0 fL   MCH 28.4 26.0 - 34.0 pg   MCHC 31.4 30.0 - 36.0 g/dL   RDW 13.6 11.5 - 15.5 %   Platelets 226 150 - 400 K/uL  Lipase, blood     Status: None   Collection Time: 05/23/15  5:32 PM  Result Value Ref Range   Lipase 29 22 - 51 U/L  I-stat troponin, ED     Status: None   Collection Time: 05/23/15  5:34 PM  Result Value Ref Range   Troponin i, poc 0.00 0.00 - 0.08 ng/mL   Comment 3            Comment: Due to the release kinetics of cTnI, a negative result within the first hours of the onset of symptoms does not rule out myocardial infarction with certainty. If myocardial infarction is still suspected, repeat the test at appropriate intervals.     Assessment/Plan: Pericarditis Resolving. Rx Mobic daily. Continue PPI.  Cardiology follow-up scheduled for further assessment. Alarm signs/symptoms discussed with patient  prompting return to ER.

## 2015-06-12 ENCOUNTER — Encounter: Payer: Self-pay | Admitting: Internal Medicine

## 2015-06-12 ENCOUNTER — Ambulatory Visit: Payer: 59 | Admitting: Physician Assistant

## 2015-06-12 ENCOUNTER — Ambulatory Visit (INDEPENDENT_AMBULATORY_CARE_PROVIDER_SITE_OTHER): Payer: 59 | Admitting: Internal Medicine

## 2015-06-12 ENCOUNTER — Telehealth: Payer: Self-pay

## 2015-06-12 VITALS — BP 128/74 | HR 79 | Temp 98.1°F | Ht 67.0 in | Wt 257.4 lb

## 2015-06-12 DIAGNOSIS — M545 Low back pain, unspecified: Secondary | ICD-10-CM

## 2015-06-12 MED ORDER — GABAPENTIN 300 MG PO CAPS: 300.0000 mg | ORAL_CAPSULE | Freq: Every day | ORAL | Status: DC

## 2015-06-12 MED ORDER — CYCLOBENZAPRINE HCL 10 MG PO TABS: 10.0000 mg | ORAL_TABLET | Freq: Every evening | ORAL | Status: DC | PRN

## 2015-06-12 NOTE — Progress Notes (Signed)
Pre visit review using our clinic review tool, if applicable. No additional management support is needed unless otherwise documented below in the visit note. 

## 2015-06-12 NOTE — Progress Notes (Signed)
Subjective:    Patient ID: Alexandra Mata, female    DOB: March 05, 1966, 49 y.o.   MRN: [id number]  DOS:  06/12/2015 Type of visit - description : Acute Interval history:  Symptoms started yesterday: Pain located at the lower left back with some radiation to the left buttock. No radiation distally. Pain is described as "hot water", on and off, somewhat worse when she moves her torso. No rash. She does have low back pain on and off but this particular back  pain seems different.    Review of Systems Denies fever chills. No recent fall or injury. No bladder or bowel incontinence. History of recent pericarditis, chest pain is ongoing, no better worse, present all the time.  Past Medical History  Diagnosis Date  . History of chicken pox   . Seizures (Alexandra Mata)     Pt states she had a couple of seizures in high school of undetermined cause  . Asymptomatic varicose veins   . Leg swelling   . Obesity   . Edema   . Unspecified sinusitis (chronic)   . Anemia 05/17/2011  . Pericarditis     Past Surgical History  Procedure Laterality Date  . Tubal ligation  1991    `    Social History   Social History  . Marital Status: Married    Spouse Name: N/A  . Number of Children: 6  . Years of Mata: N/A   Occupational History  . Not on file.   Social History Main Topics  . Smoking status: Never Smoker   . Smokeless tobacco: Never Used  . Alcohol Use: No  . Drug Use: No  . Sexual Activity: Yes   Other Topics Concern  . Not on file   Social History Narrative   Regular exercise:  No   Caffeine Use:  No   3 biological children, 3 Alexandra Mata- one child aged Alexandra Mata- Alexandra Mata (three children ages 48, 68 and 39)      78- denies   Never smoked.   Denies drug use      Alexandra Mata- working on her bachelors in Alexandra Mata.      Married to Alexandra Mata        Medication List       This list is accurate as  of: 06/12/15 11:59 PM.  Always use your most recent med list.               cyclobenzaprine 10 MG tablet  Commonly known as:  FLEXERIL  Take 1 tablet (10 mg total) by mouth at bedtime as needed for muscle spasms.     escitalopram 10 MG tablet  Commonly known as:  LEXAPRO  Take 1 tablet (10 mg total) by mouth daily.     furosemide 20 MG tablet  Commonly known as:  LASIX  Take 1 tablet (20 mg total) by mouth daily.     gabapentin 300 MG capsule  Commonly known as:  NEURONTIN  Take 1 capsule (300 mg total) by mouth at bedtime.     ibuprofen 200 MG tablet  Commonly known as:  ADVIL,MOTRIN  Take 200 mg by mouth every 6 (six) hours as needed for moderate pain.     loratadine-pseudoephedrine 10-240 MG 24 hr tablet  Commonly known as:  CLARITIN-D 24 HOUR  Take 1 tablet by mouth daily.     meloxicam 15 MG tablet  Commonly known as:  MOBIC  Take 1 tablet (15 mg total) by mouth daily.     multivitamin with minerals Tabs tablet  Take 1 tablet by mouth daily.     omeprazole 20 MG capsule  Commonly known as:  PRILOSEC  Take 20 mg by mouth daily.     traMADol 50 MG tablet  Commonly known as:  ULTRAM  Take 1 tablet (50 mg total) by mouth every 8 (eight) hours as needed.           Objective:   Physical Exam BP 128/74 mmHg  Pulse 79  Temp(Src) 98.1 F (36.7 C) (Oral)  Ht 5\' 7"  (1.702 m)  Wt 257 lb 6 oz (116.745 kg)  BMI 40.30 kg/m2  SpO2 99%  LMP 04/15/2013 General:   Well developed, well nourished . NAD.  HEENT:  Normocephalic . Face symmetric, atraumatic  MSK: + TTP:  at the low back bilaterally, SI areas(L>R) and trochanteric bursa on the left. Skin: Not pale. Not jaundice. Careful inspection of the hip and lower extremities: No rash or blisters Neurologic:  alert & oriented X3.  Speech normal, gait appropriate for age and unassisted. DTRs symmetric, strength symmetric, straight leg test negative. Psych--  Cognition and judgment appear intact.  Cooperative  with normal attention span and concentration.  Behavior appropriate. No anxious or depressed appearing.      Assessment & Plan:   Low back pain: Back pain with MSK and neuropathic features. ?Alexandra shingles (had shingles, L flank last year) Recommend to start Flexeril and use Ultram as needed. Also gabapentin. To call immediately if she developed any rash. Valtrex?Marland Kitchen RTC with PCP in 2 weeks

## 2015-06-12 NOTE — Patient Instructions (Signed)
Start gabapentin every night  Take Flexeril twice a day as needed for pain. May cause drowsiness.  Take Ultram as needed.  Come back and see your primary doctor in 2 weeks  If you have severe symptoms or see a  rash in your left lower extremity please call the office.

## 2015-06-15 ENCOUNTER — Ambulatory Visit (INDEPENDENT_AMBULATORY_CARE_PROVIDER_SITE_OTHER): Payer: 59 | Admitting: Cardiology

## 2015-06-15 ENCOUNTER — Encounter: Payer: Self-pay | Admitting: Cardiology

## 2015-06-15 VITALS — BP 118/82 | HR 82 | Ht 67.0 in | Wt 260.4 lb

## 2015-06-15 DIAGNOSIS — I319 Disease of pericardium, unspecified: Secondary | ICD-10-CM | POA: Diagnosis not present

## 2015-06-15 NOTE — Progress Notes (Signed)
Cardiology Office Note   Date:  06/15/2015   ID:  Alexandra, Mata [birth year], MRN [id number]  PCP:  Leeanne Rio, PA-C  Cardiologist:   Minus Breeding, MD   Chief Complaint  Patient presents with  . New Evaluation    Pericarditis  . Chest Pain  . Leg Swelling  . Shortness of Breath      History of Present Illness: Alexandra Mata is a 49 y.o. female who presents for evaluation of chest pain. She says she started with a sinus infection in early September. She subsequently developed some hurting in her chest. She went to [facility] and I was able to review these records. She was admitted overnight. Enzymes were negative. Her sedimentation rate and C-reactive protein were slightly elevated. BNP was normal. EKG was reported as normal although I didn't see this EKG. I did see the results of an echocardiogram. There was some mild right ventricular and left atrial dilatation but she had preserved ejection fraction, no valvular abnormalities and no evidence of pericardial effusion. Yet she was told that she probably had pericarditis and was sent home on a course of Motrin. She said she really didn't have improvement. She's had discomfort in her chest since then. She describes discomfort under her left breast. Some of this is tender to touch. Some of her discomfort is deeper in her mid chest and positional. It seems to be constant. She's been more short of breath. She's been more fatigued. She's had diffuse joint pains. She's had some increased nonpitting swelling in her legs. She's been too fatigued to do her second job. She feels like she's got tenderness or swelling in her feet. She's had decreased strength in her hands. She's not describing PND or orthopnea. She's not describing presyncope or syncope.  Past Medical History  Diagnosis Date  . History of chicken pox   . Seizures ([place])     Pt states she had a couple of seizures in high school of undetermined cause  .  Asymptomatic varicose veins   . Leg swelling   . Obesity   . Edema   . Unspecified sinusitis (chronic)   . Anemia 05/17/2011  . Pericarditis     Past Surgical History  Procedure Laterality Date  . Tubal ligation  1991    `     Current Outpatient Prescriptions  Medication Sig Dispense Refill  . escitalopram (LEXAPRO) 10 MG tablet Take 1 tablet (10 mg total) by mouth daily. 30 tablet 5  . FLEXERIL 10 MG tablet Take 1 tablet by mouth as needed.    . furosemide (LASIX) 20 MG tablet Take 1 tablet (20 mg total) by mouth daily. 30 tablet 3  . gabapentin (NEURONTIN) 300 MG capsule Take 1 capsule (300 mg total) by mouth at bedtime. 30 capsule 0  . ibuprofen (ADVIL,MOTRIN) 200 MG tablet Take 200 mg by mouth every 6 (six) hours as needed for moderate pain.     Marland Kitchen loratadine-pseudoephedrine (CLARITIN-D 24 HOUR) 10-240 MG per 24 hr tablet Take 1 tablet by mouth daily. 60 tablet 0  . meloxicam (MOBIC) 15 MG tablet Take 1 tablet (15 mg total) by mouth daily. 30 tablet 0  . Multiple Vitamin (MULTIVITAMIN WITH MINERALS) TABS tablet Take 1 tablet by mouth daily.    Marland Kitchen omeprazole (PRILOSEC) 20 MG capsule Take 20 mg by mouth daily.    . traMADol (ULTRAM) 50 MG tablet Take 1 tablet (50 mg total) by mouth every 8 (eight) hours  as needed. 30 tablet 0   No current facility-administered medications for this visit.    Allergies:   Review of patient's allergies indicates no known allergies.    Social History:  The patient  reports that she has never smoked. She has never used smokeless tobacco. She reports that she does not drink alcohol or use illicit drugs.   Family History:  The patient's family history includes Arthritis in her mother; Asthma in her mother; Cancer in her paternal grandfather; Coronary artery disease in her brother; Diabetes in her maternal grandmother, mother, and paternal grandmother; Heart disease in her maternal grandmother; Hypertension in her father, maternal grandmother, and mother.     ROS:  Please see the history of present illness.   Otherwise, review of systems are positive for none.   All other systems are reviewed and negative.    PHYSICAL EXAM: VS:  BP 118/82 mmHg  Pulse 82  Ht 5\' 7"  (1.702 m)  Wt 260 lb 6.4 oz (118.117 kg)  BMI 40.77 kg/m2  LMP 04/15/2013 , BMI Body mass index is 40.77 kg/(m^2). GENERAL:  Well appearing HEENT:  Pupils equal round and reactive, fundi not visualized, oral mucosa unremarkable NECK:  No jugular venous distention, waveform within normal limits, carotid upstroke brisk and symmetric, no bruits, no thyromegaly LYMPHATICS:  No cervical, inguinal adenopathy LUNGS:  Clear to auscultation bilaterally BACK:  No CVA tenderness CHEST:  Unremarkable HEART:  PMI not displaced or sustained,S1 and S2 within normal limits, no S3, no S4, no clicks, no rubs, no murmurs ABD:  Flat, positive bowel sounds normal in frequency in pitch, no bruits, no rebound, no guarding, no midline pulsatile mass, no hepatomegaly, no splenomegaly EXT:  2 plus pulses throughout, no edema, no cyanosis no clubbing SKIN:  No rashes no nodules NEURO:  Cranial nerves II through XII grossly intact, motor grossly intact throughout PSYCH:  Cognitively intact, oriented to person place and time    EKG:  EKG is not ordered today. The ekg demonstrates sinus rhythm, rate 69, axis within normal limits, intervals within normal limits, nonspecific inferior T-wave flattening, 05/23/2015   Recent Labs: 05/23/2015: BUN 23*; Creatinine, Ser 0.58; Hemoglobin 12.4; Platelets 226; Potassium 4.3; Sodium 140    Lipid Panel   Wt Readings from Last 3 Encounters:  06/15/15 260 lb 6.4 oz (118.117 kg)  06/12/15 257 lb 6 oz (116.745 kg)  05/23/15 250 lb (113.399 kg)      Other studies Reviewed: Additional studies/ records that were reviewed today include: Records from [facility]. Review of the above records demonstrates:  Please see elsewhere in the note.     ASSESSMENT AND  PLAN:  CHEST PAIN:  I see no indication that would suggest a diagnosis of pericarditis. Nothing on her exam or previous echo suggest this. EKG was unremarkable. Her pain is not consistent with this. Rather she might have some systemic process and I have suggested that she see a rheumatologist. She does not have significant cardiovascular risk factors to suggest obstructive coronary disease. I would be happy to see her back to reevaluate after she has had further evaluations by her primary physician and perhaps rheumatologic specialist to try to explain her diffuse complaints.  Current medicines are reviewed at length with the patient today.  The patient does not have concerns regarding medicines.  The following changes have been made:  no change  Labs/ tests ordered today include: None  No orders of the defined types were placed in this encounter.  Disposition:   FU with me as needed.      Signed, Minus Breeding, MD  06/15/2015 9:43 AM    [facility]

## 2015-06-15 NOTE — Patient Instructions (Addendum)
Your physician recommends that you schedule a follow-up appointment in: As Needed  Dr. Bo Merino, MD   Rheumatologist   Address: [address], [place], [place] [address]  [phone]

## 2015-06-16 ENCOUNTER — Telehealth: Payer: Self-pay | Admitting: Physician Assistant

## 2015-06-16 MED ORDER — VALACYCLOVIR HCL 1 G PO TABS: 1000.0000 mg | ORAL_TABLET | Freq: Three times a day (TID) | ORAL | Status: DC

## 2015-06-16 NOTE — Telephone Encounter (Signed)
Caller name: Falisa   Relationship to patient: Self  Can be reached: [phone]  Pharmacy: [facility] [place]  Reason for call: Pt says on her last visit she was seen for a rash she says that she and the provider discussed if things worsened that he would call in a anti viral medication. Pt says she woke up this morning with the same rash, she would like to know if she can have Rx  Called in or will she need another appt.

## 2015-06-16 NOTE — Addendum Note (Signed)
Addended by: Wilfrid Lund on: 06/16/2015 01:52 PM   Modules accepted: Orders

## 2015-06-16 NOTE — Telephone Encounter (Addendum)
She came w/ back pain, I recommended Valtrex if she develops a rash at the area pain. If that is the case call: Valtrex 1000 mg 1 by mouth 3 times a day #21

## 2015-06-16 NOTE — Telephone Encounter (Signed)
Valtrex 1000 mg, #21 and 0 refills sent to [facility]. Spoke with Pt, informed her to take 1 tablet by mouth three times daily for 7 days. Instructed her if not better or rash becomes worse she will need to be seen again.

## 2015-06-16 NOTE — Telephone Encounter (Signed)
Please advise 

## 2015-06-21 ENCOUNTER — Other Ambulatory Visit: Payer: Self-pay | Admitting: Physician Assistant

## 2015-07-06 ENCOUNTER — Telehealth: Payer: Self-pay | Admitting: Cardiology

## 2015-07-06 ENCOUNTER — Telehealth: Payer: Self-pay | Admitting: Physician Assistant

## 2015-07-06 DIAGNOSIS — R079 Chest pain, unspecified: Secondary | ICD-10-CM

## 2015-07-06 DIAGNOSIS — M2550 Pain in unspecified joint: Secondary | ICD-10-CM

## 2015-07-06 NOTE — Telephone Encounter (Signed)
Spoke with pt, she said dr hochrein was referring her for lupus eval. Dr devashwar's office has received no paperwork or referral from [place]. No mention is the last office note. Will forward to dr hochrein

## 2015-07-06 NOTE — Telephone Encounter (Signed)
New Message    Pt is wanting on the referral for Dr. Patrecia Pour to go through please call pt

## 2015-07-07 NOTE — Telephone Encounter (Signed)
Spoke with pt, aware referral placed. 

## 2015-07-07 NOTE — Telephone Encounter (Signed)
At the office visit I suggested that the next step should be a rheumatologist.  I did not understand that she was expecting [place] to make the referral but I am happy to please do this.  Patient with elevated inflammatory markers and pain.  Thank you.

## 2015-07-08 ENCOUNTER — Encounter: Payer: Self-pay | Admitting: Medical

## 2015-07-08 ENCOUNTER — Ambulatory Visit (INDEPENDENT_AMBULATORY_CARE_PROVIDER_SITE_OTHER): Payer: 59 | Admitting: Medical

## 2015-07-08 VITALS — BP 118/72 | HR 86 | Temp 97.1°F | Ht 67.0 in | Wt 262.0 lb

## 2015-07-08 DIAGNOSIS — B029 Zoster without complications: Secondary | ICD-10-CM

## 2015-07-08 DIAGNOSIS — R1013 Epigastric pain: Secondary | ICD-10-CM

## 2015-07-08 DIAGNOSIS — M2550 Pain in unspecified joint: Secondary | ICD-10-CM

## 2015-07-08 DIAGNOSIS — G8929 Other chronic pain: Secondary | ICD-10-CM

## 2015-07-08 DIAGNOSIS — R5383 Other fatigue: Secondary | ICD-10-CM | POA: Diagnosis not present

## 2015-07-08 DIAGNOSIS — R21 Rash and other nonspecific skin eruption: Secondary | ICD-10-CM

## 2015-07-08 LAB — CBC WITH DIFFERENTIAL/PLATELET
BASOS PCT: 1.2 % (ref 0.0–3.0)
Basophils Absolute: 0.1 10*3/uL (ref 0.0–0.1)
EOS PCT: 2.1 % (ref 0.0–5.0)
Eosinophils Absolute: 0.2 10*3/uL (ref 0.0–0.7)
HEMATOCRIT: 39.3 % (ref 36.0–46.0)
HEMOGLOBIN: 12.7 g/dL (ref 12.0–15.0)
LYMPHS PCT: 19.7 % (ref 12.0–46.0)
Lymphs Abs: 1.8 10*3/uL (ref 0.7–4.0)
MCHC: 32.4 g/dL (ref 30.0–36.0)
MCV: 87.8 fl (ref 78.0–100.0)
MONOS PCT: 6.3 % (ref 3.0–12.0)
Monocytes Absolute: 0.6 10*3/uL (ref 0.1–1.0)
Neutro Abs: 6.4 10*3/uL (ref 1.4–7.7)
Neutrophils Relative %: 70.7 % (ref 43.0–77.0)
Platelets: 226 10*3/uL (ref 150.0–400.0)
RBC: 4.47 Mil/uL (ref 3.87–5.11)
RDW: 14.4 % (ref 11.5–15.5)
WBC: 9 10*3/uL (ref 4.0–10.5)

## 2015-07-08 LAB — RHEUMATOID FACTOR: Rhuematoid fact SerPl-aCnc: <10 IU/mL (ref <=14)

## 2015-07-08 LAB — COMPREHENSIVE METABOLIC PANEL
ALBUMIN: 3.9 g/dL (ref 3.5–5.2)
ALK PHOS: 90 U/L (ref 39–117)
ALT: 18 U/L (ref 0–35)
AST: 14 U/L (ref 0–37)
BUN: 30 mg/dL — AB (ref 6–23)
CALCIUM: 9.2 mg/dL (ref 8.4–10.5)
CO2: 25 mEq/L (ref 19–32)
Chloride: 106 mEq/L (ref 96–112)
Creatinine, Ser: 0.65 mg/dL (ref 0.40–1.20)
GFR: 102.54 mL/min (ref >60.00)
Glucose, Bld: 87 mg/dL (ref 70–99)
POTASSIUM: 4 meq/L (ref 3.5–5.1)
SODIUM: 140 meq/L (ref 135–145)
TOTAL PROTEIN: 7.3 g/dL (ref 6.0–8.3)
Total Bilirubin: 0.4 mg/dL (ref 0.2–1.2)

## 2015-07-08 LAB — C-REACTIVE PROTEIN: CRP: 3 mg/dL (ref 0.5–20.0)

## 2015-07-08 LAB — LIPASE: Lipase: 19 U/L (ref 11.0–59.0)

## 2015-07-08 LAB — TSH: TSH: 2.46 u[IU]/mL (ref 0.35–4.50)

## 2015-07-08 LAB — SEDIMENTATION RATE: Sed Rate: 28 mm/hr — ABNORMAL HIGH (ref 0–22)

## 2015-07-08 LAB — AMYLASE: AMYLASE: 22 U/L — AB (ref 27–131)

## 2015-07-08 MED ORDER — TRAMADOL HCL 50 MG PO TABS: 50.0000 mg | ORAL_TABLET | Freq: Three times a day (TID) | ORAL | Status: DC | PRN

## 2015-07-08 MED ORDER — RANITIDINE HCL 150 MG PO CAPS: 150.0000 mg | ORAL_CAPSULE | Freq: Two times a day (BID) | ORAL | Status: DC

## 2015-07-08 MED ORDER — FAMCICLOVIR 500 MG PO TABS: 500.0000 mg | ORAL_TABLET | Freq: Three times a day (TID) | ORAL | Status: DC

## 2015-07-08 NOTE — Progress Notes (Signed)
Subjective:    Patient ID: Alexandra Mata, female    DOB: September 27, 1965, 49 y.o.   MRN: [id number]  HPI   Pt in for primarily rash.Pt has rash on her foot. Pt states on her anterior ankle junction. She had blister pop up. Blisters popped up on Saturday. Blisters were popped.  She had sharp stabbing burning pain with itch. She states has had shingles before in the past. Pt states pain is less.   Pt also wants to know if she may have lupus. She states she is fatigued. Pt does have both wrist pain with elbow pain. Pt also reports knee and ankle pain. Pt does not report any skin rashes. Pt states in the past 2 months ago when had pericaridtis and eventually saw the cardiologist he thought she may have lupous. Pt states 2 yrs with arthralgias. Last 3 months pain in joints is worse.  3 months also of epigastric pain. Pain present mild constant for 3 months. Some more pain after eating. Occasional ruq pain. Patient occasional will get nausea. No black or bloody stools.   Review of Systems  Constitutional: Positive for fatigue. Negative for fever and chills.  HENT: Negative for congestion.   Respiratory: Negative for cough, chest tightness, shortness of breath and wheezing.   Cardiovascular: Negative for chest pain and palpitations.  Musculoskeletal: Positive for arthralgias.  Skin: Positive for rash.       Rt ankle/anterior ankle rash.  Neurological: Negative for dizziness, speech difficulty, numbness and headaches.  Psychiatric/Behavioral: Negative for suicidal ideas, behavioral problems and confusion. The patient is not nervous/anxious.     Past Medical History  Diagnosis Date  . History of chicken pox   . Seizures ([place])     Pt states she had a couple of seizures in high school of undetermined cause  . Asymptomatic varicose veins   . Obesity   . Edema   . Unspecified sinusitis (chronic)   . Anemia 05/17/2011    Social History   Social History  . Marital Status: Married    Spouse  Name: N/A  . Number of Children: 6  . Years of Education: N/A   Occupational History  . Not on file.   Social History Main Topics  . Smoking status: Never Smoker   . Smokeless tobacco: Never Used  . Alcohol Use: No  . Drug Use: No  . Sexual Activity: Yes   Other Topics Concern  . Not on file   Social History Narrative   Regular exercise:  No   Caffeine Use:  No   3 biological children, 3 [place]- two children   [place]- Daughter Larene [place] (three children)      [place]- denies   Never smoked.   Denies drug use      [employer].      Married to Liborio Nixon    Past Surgical History  Procedure Laterality Date  . Tubal ligation  1991    `    Family History  Problem Relation Age of Onset  . Arthritis Mother   . Hypertension Mother   . Diabetes Mother   . Asthma Mother   . Hypertension Father   . Diabetes Maternal Grandmother   . Hypertension Maternal Grandmother   . Heart disease Maternal Grandmother   . Diabetes Paternal Grandmother   . Cancer Paternal Grandfather     lung  . Coronary artery disease  Brother 15    MI    No Known Allergies  Current Outpatient Prescriptions on File Prior to Visit  Medication Sig Dispense Refill  . escitalopram (LEXAPRO) 10 MG tablet Take 1 tablet (10 mg total) by mouth daily. 30 tablet 5  . FLEXERIL 10 MG tablet Take 1 tablet by mouth as needed.    . furosemide (LASIX) 20 MG tablet Take 1 tablet (20 mg total) by mouth daily. 30 tablet 3  . gabapentin (NEURONTIN) 300 MG capsule Take 1 capsule (300 mg total) by mouth at bedtime. 30 capsule 0  . ibuprofen (ADVIL,MOTRIN) 200 MG tablet Take 200 mg by mouth every 6 (six) hours as needed for moderate pain.     Marland Kitchen loratadine-pseudoephedrine (CLARITIN-D 24 HOUR) 10-240 MG per 24 hr tablet Take 1 tablet by mouth daily. 60 tablet 0  . meloxicam (MOBIC) 15 MG tablet TAKE ONE TABLET BY MOUTH ONCE DAILY 30 tablet  0  . Multiple Vitamin (MULTIVITAMIN WITH MINERALS) TABS tablet Take 1 tablet by mouth daily.    Marland Kitchen omeprazole (PRILOSEC) 20 MG capsule Take 20 mg by mouth daily.    . traMADol (ULTRAM) 50 MG tablet Take 1 tablet (50 mg total) by mouth every 8 (eight) hours as needed. 30 tablet 0  . valACYclovir (VALTREX) 1000 MG tablet Take 1 tablet (1,000 mg total) by mouth 3 (three) times daily. 21 tablet 0   No current facility-administered medications on file prior to visit.    BP 118/72 mmHg  Pulse 86  Temp(Src) 97.1 F (36.2 C) (Oral)  Ht 5\' 7"  (1.702 m)  Wt 262 lb (118.842 kg)  BMI 41.03 kg/m2  SpO2 98%  LMP 04/15/2013       Objective:   Physical Exam  General Mental Status- Alert. General Appearance- Not in acute distress.   Skin General: Color- Normal Color. Moisture- Normal Moisture.  Neck Carotid Arteries- Normal color. Moisture- Normal Moisture. No carotid bruits. No JVD.  Chest and Lung Exam Auscultation: Breath Sounds:-Normal.  Cardiovascular Auscultation:Rythm- Regular. Murmurs & Other Heart Sounds:Auscultation of the heart reveals- No Murmurs.  Abdomen Inspection:-Inspeection Normal. Palpation/Percussion:Note:No mass. Palpation and Percussion of the abdomen reveal- Non Tender, Non Distended + BS, no rebound or guarding.  Joint exam- pain on movment of wrist and elbows but no warmth or swelling.  Derm- rt anterior ankle. 4 small apparent ruptured vesicles that are scabbing. Mild hyperpigmentation.    Neurologic Cranial Nerve exam:- CN III-XII intact(No nystagmus), symmetric smile. Drift Test:- No drift. Romberg Exam:- Negative.  Heal to Toe Gait exam:-Normal. Finger to Nose:- Normal/Intact Strength:- 5/5 equal and symmetric strength both upper and lower extremities.      Assessment & Plan:  For shingles on rt foot. Rx famvir. Upper tx limit time frame but will go ahead and rx. For possible post shingles nerve pain rx tramadol.  For fatigue and arthralgia  will get lab and ra panel. If ana elevated will try to expidite current rheumatology referal. Continue meloxicam.  For epigastric pain get cmp, cbc, amylase, lipase and rx ranitdine. Depending on lab results and how your are doing may consider US abdomen.  For your rash of ear just outside of canal use moisturizer twice a day and hydrocortisone one time daily. Will recheck area on follow up in 10 days

## 2015-07-08 NOTE — Patient Instructions (Addendum)
For shingles on rt foot. Rx famvir. Upper tx limit time frame but will go ahead and rx. For possible post shingles nerve pain rx tramadol.  For fatigue and arthralgia will get lab and ra panel. If ana elevated will try to expidite current rheumatology referal. Continue meloxicam.  For epigastric pain get cmp, cbc, amylase, lipase and rx ranitdine. Depending on lab results and how your are doing may consider US abdomen.  For your rash of ear just outside of canal use moisturizer twice a day and hydrocortisone one time daily. Will recheck area on follow up in 10 days.

## 2015-07-08 NOTE — Progress Notes (Signed)
Pre visit review using our clinic review tool, if applicable. No additional management support is needed unless otherwise documented below in the visit note. 

## 2015-07-09 LAB — H. PYLORI BREATH TEST: H. PYLORI BREATH TEST: NOT DETECTED

## 2015-07-10 LAB — ANA: Anti Nuclear Antibody(ANA): NEGATIVE

## 2015-07-13 NOTE — Telephone Encounter (Signed)
Referral faxed to DR Estanislado Pandy, insurance auth # [id number] appt

## 2015-07-13 NOTE — Addendum Note (Signed)
Addended by: Tasia Catchings on: 07/13/2015 08:15 AM   Modules accepted: Orders

## 2015-07-13 NOTE — Addendum Note (Signed)
Addended by: Tasia Catchings on: 07/13/2015 08:42 AM   Modules accepted: Orders

## 2015-07-13 NOTE — Telephone Encounter (Signed)
Please see referal.

## 2015-07-16 ENCOUNTER — Other Ambulatory Visit: Payer: Self-pay | Admitting: Internal Medicine

## 2015-07-16 ENCOUNTER — Other Ambulatory Visit: Payer: Self-pay | Admitting: Physician Assistant

## 2015-08-04 ENCOUNTER — Ambulatory Visit (INDEPENDENT_AMBULATORY_CARE_PROVIDER_SITE_OTHER): Payer: 59 | Admitting: Physician Assistant

## 2015-08-04 ENCOUNTER — Encounter: Payer: Self-pay | Admitting: Physician Assistant

## 2015-08-04 VITALS — BP 126/84 | HR 103 | Temp 98.1°F | Ht 67.0 in | Wt 267.2 lb

## 2015-08-04 DIAGNOSIS — M791 Myalgia: Secondary | ICD-10-CM

## 2015-08-04 DIAGNOSIS — M609 Myositis, unspecified: Secondary | ICD-10-CM | POA: Diagnosis not present

## 2015-08-04 DIAGNOSIS — IMO0001 Reserved for inherently not codable concepts without codable children: Secondary | ICD-10-CM

## 2015-08-04 MED ORDER — TRAMADOL HCL (ER BIPHASIC) 150 MG PO CP24: 150.0000 mg | ORAL_CAPSULE | Freq: Every day | ORAL | Status: DC

## 2015-08-04 MED ORDER — DULOXETINE HCL 30 MG PO CPEP: 30.0000 mg | ORAL_CAPSULE | Freq: Every day | ORAL | Status: DC

## 2015-08-04 NOTE — Patient Instructions (Signed)
Please start the Cymbalta daily instead of the Lexapro. This will help with mood and pain.  Start the Tramadol ER once daily to help with pain.   I will be getting your records from Rheumatology so we can see what things are looking like. I recommend you call them and ask the MD there to review the results with you.  Follow-up 3-4 weeks.

## 2015-08-04 NOTE — Assessment & Plan Note (Signed)
Patient has seen Rheumatology. Had had lab workup and imaging, none available to view today. Was told she had fibromyalgia. This is a definite possibility giving number of tender points on exam (just underneath diagnostic threshold). Will obtain records. Will begin Cymbalta and Tramadol ER. Follow-up 3-4 weeks.

## 2015-08-04 NOTE — Progress Notes (Signed)
Patient presents to clinic today c/o widespread myalgias and arthralgias worse in wrists and forearms over the past 3 months. Endorses significant fatigue despite good sleep at night. Denies depressed mood or anhedonia. Denies numbness, tingling or swelling of extremities. Patient was referred to Rheumatology ([facility]) and had an appointment last week. Endorses x-rays were obtained and labs were drawn but she has not heard the results yet. States the MD stated she most likely had fibromyalgia but treatment options were reviewed.   Past Medical History  Diagnosis Date  . History of chicken pox   . Seizures ([place])     Pt states she had a couple of seizures in high school of undetermined cause  . Asymptomatic varicose veins   . Obesity   . Edema   . Unspecified sinusitis (chronic)   . Anemia 05/17/2011    Current Outpatient Prescriptions on File Prior to Visit  Medication Sig Dispense Refill  . cyclobenzaprine (FLEXERIL) 10 MG tablet TAKE ONE TABLET BY MOUTH AT BEDTIME AS NEEDED FOR MUSCLE SPASM 21 tablet 0  . furosemide (LASIX) 20 MG tablet Take 1 tablet (20 mg total) by mouth daily. 30 tablet 3  . gabapentin (NEURONTIN) 300 MG capsule TAKE ONE CAPSULE BY MOUTH AT BEDTIME 30 capsule 3  . ibuprofen (ADVIL,MOTRIN) 200 MG tablet Take 200 mg by mouth every 6 (six) hours as needed for moderate pain.     Marland Kitchen loratadine-pseudoephedrine (CLARITIN-D 24 HOUR) 10-240 MG per 24 hr tablet Take 1 tablet by mouth daily. 60 tablet 0  . Multiple Vitamin (MULTIVITAMIN WITH MINERALS) TABS tablet Take 1 tablet by mouth daily.    . famciclovir (FAMVIR) 500 MG tablet Take 1 tablet (500 mg total) by mouth 3 (three) times daily. (Patient not taking: Reported on 08/04/2015) 21 tablet 0  . omeprazole (PRILOSEC) 20 MG capsule Take 20 mg by mouth daily. Reported on 08/04/2015    . ranitidine (ZANTAC) 150 MG capsule Take 1 capsule (150 mg total) by mouth 2 (two) times daily. (Patient not taking: Reported on 08/04/2015)  60 capsule 0  . valACYclovir (VALTREX) 1000 MG tablet Take 1 tablet (1,000 mg total) by mouth 3 (three) times daily. (Patient not taking: Reported on 08/04/2015) 21 tablet 0   No current facility-administered medications on file prior to visit.    No Known Allergies  Family History  Problem Relation Age of Onset  . Arthritis Mother   . Hypertension Mother   . Diabetes Mother   . Asthma Mother   . Hypertension Father   . Diabetes Maternal Grandmother   . Hypertension Maternal Grandmother   . Heart disease Maternal Grandmother   . Diabetes Paternal Grandmother   . Cancer Paternal Grandfather     lung  . Coronary artery disease Brother 81    MI    Social History   Social History  . Marital Status: Married    Spouse Name: N/A  . Number of Children: 6  . Years of Education: N/A   Social History Main Topics  . Smoking status: Never Smoker   . Smokeless tobacco: Never Used  . Alcohol Use: No  . Drug Use: No  . Sexual Activity: Yes   Other Topics Concern  . None   Social History Narrative   Regular exercise:  No   Caffeine Use:  No   3 biological children, 3 [place]- two children   [place]- Daughter Larene [place] (three children)  Etoh- denies   Never smoked.   Denies drug use      [employer].      Married to [employer]   Review of Systems - See HPI.  All other ROS are negative.  BP 126/84 mmHg  Pulse 103  Temp(Src) 98.1 F (36.7 C) (Oral)  Ht 5' 7" (1.702 m)  Wt 267 lb 3.2 oz (121.201 kg)  BMI 41.84 kg/m2  SpO2 98%  LMP 04/15/2013  Physical Exam  Constitutional: She is oriented to person, place, and time and well-developed, well-nourished, and in no distress.  HENT:  Head: Normocephalic and atraumatic.  Eyes: Conjunctivae are normal.  Cardiovascular: Normal rate, regular rhythm, normal heart sounds and intact distal pulses.   Pulmonary/Chest: Effort normal and breath  sounds normal. No respiratory distress. She has no wheezes. She has no rales. She exhibits no tenderness.  Musculoskeletal:       Right shoulder: Normal.       Left shoulder: Normal.       Cervical back: Normal.       Thoracic back: Normal.       Lumbar back: Normal.  10 tender points notes on exam.   Neurological: She is alert and oriented to person, place, and time.  Skin: Skin is warm and dry. No rash noted.  Vitals reviewed.   Recent Results (from the past 2160 hour(s))  POCT urinalysis dipstick     Status: None   Collection Time: 05/12/15  3:14 PM  Result Value Ref Range   Color, UA yellow    Clarity, UA clear    Glucose, UA negative    Bilirubin, UA negative    Ketones, UA negative    Spec Grav, UA >=1.030    Blood, UA negative    pH, UA 5.5    Protein, UA negative    Urobilinogen, UA 0.2    Nitrite, UA negative    Leukocytes, UA Negative Negative  Basic metabolic panel     Status: Abnormal   Collection Time: 05/23/15  5:13 PM  Result Value Ref Range   Sodium 140 135 - 145 mmol/L   Potassium 4.3 3.5 - 5.1 mmol/L   Chloride 107 101 - 111 mmol/L   CO2 25 22 - 32 mmol/L   Glucose, Bld 101 (H) 65 - 99 mg/dL   BUN 23 (H) 6 - 20 mg/dL   Creatinine, Ser 0.58 0.44 - 1.00 mg/dL   Calcium 8.8 (L) 8.9 - 10.3 mg/dL   GFR calc non Af Amer >60 >60 mL/min   GFR calc Af Amer >60 >60 mL/min    Comment: (NOTE) The eGFR has been calculated using the CKD EPI equation. This calculation has not been validated in all clinical situations. eGFR's persistently <60 mL/min signify possible Chronic Kidney Disease. CORRECTED ON 10/08 AT 1811: PREVIOUSLY REPORTED AS >60    Anion gap 8 5 - 15  CBC     Status: None   Collection Time: 05/23/15  5:13 PM  Result Value Ref Range   WBC 9.3 4.0 - 10.5 K/uL   RBC 4.37 3.87 - 5.11 MIL/uL   Hemoglobin 12.4 12.0 - 15.0 g/dL   HCT 39.5 36.0 - 46.0 %   MCV 90.4 78.0 - 100.0 fL   MCH 28.4 26.0 - 34.0 pg   MCHC 31.4 30.0 - 36.0 g/dL   RDW 13.6  11.5 - 15.5 %   Platelets 226 150 - 400 K/uL  Lipase, blood  Status: None   Collection Time: 05/23/15  5:32 PM  Result Value Ref Range   Lipase 29 22 - 51 U/L  I-stat troponin, ED     Status: None   Collection Time: 05/23/15  5:34 PM  Result Value Ref Range   Troponin i, poc 0.00 0.00 - 0.08 ng/mL   Comment 3            Comment: Due to the release kinetics of cTnI, a negative result within the first hours of the onset of symptoms does not rule out myocardial infarction with certainty. If myocardial infarction is still suspected, repeat the test at appropriate intervals.   CBC w/Diff     Status: None   Collection Time: 07/08/15  9:03 AM  Result Value Ref Range   WBC 9.0 4.0 - 10.5 K/uL   RBC 4.47 3.87 - 5.11 Mil/uL   Hemoglobin 12.7 12.0 - 15.0 g/dL   HCT 39.3 36.0 - 46.0 %   MCV 87.8 78.0 - 100.0 fl   MCHC 32.4 30.0 - 36.0 g/dL   RDW 14.4 11.5 - 15.5 %   Platelets 226.0 150.0 - 400.0 K/uL   Neutrophils Relative % 70.7 43.0 - 77.0 %   Lymphocytes Relative 19.7 12.0 - 46.0 %   Monocytes Relative 6.3 3.0 - 12.0 %   Eosinophils Relative 2.1 0.0 - 5.0 %   Basophils Relative 1.2 0.0 - 3.0 %   Neutro Abs 6.4 1.4 - 7.7 K/uL   Lymphs Abs 1.8 0.7 - 4.0 K/uL   Monocytes Absolute 0.6 0.1 - 1.0 K/uL   Eosinophils Absolute 0.2 0.0 - 0.7 K/uL   Basophils Absolute 0.1 0.0 - 0.1 K/uL  Comprehensive metabolic panel     Status: Abnormal   Collection Time: 07/08/15  9:03 AM  Result Value Ref Range   Sodium 140 135 - 145 mEq/L   Potassium 4.0 3.5 - 5.1 mEq/L   Chloride 106 96 - 112 mEq/L   CO2 25 19 - 32 mEq/L   Glucose, Bld 87 70 - 99 mg/dL   BUN 30 (H) 6 - 23 mg/dL   Creatinine, Ser 0.65 0.40 - 1.20 mg/dL   Total Bilirubin 0.4 0.2 - 1.2 mg/dL   Alkaline Phosphatase 90 39 - 117 U/L   AST 14 0 - 37 U/L   ALT 18 0 - 35 U/L   Total Protein 7.3 6.0 - 8.3 g/dL   Albumin 3.9 3.5 - 5.2 g/dL   Calcium 9.2 8.4 - 10.5 mg/dL   GFR 102.54 >60.00 mL/min  TSH     Status: None   Collection  Time: 07/08/15  9:03 AM  Result Value Ref Range   TSH 2.46 0.35 - 4.50 uIU/mL  Amylase     Status: Abnormal   Collection Time: 07/08/15  9:03 AM  Result Value Ref Range   Amylase 22 (L) 27 - 131 U/L  Lipase     Status: None   Collection Time: 07/08/15  9:03 AM  Result Value Ref Range   Lipase 19.0 11.0 - 59.0 U/L  H. pylori breath test     Status: None   Collection Time: 07/08/15  9:03 AM  Result Value Ref Range   H. pylori Breath Test NOT DETECTED Not Detected    Comment:   Antimicrobials, proton pump inhibitors, and bismuth preparations are known to suppress H. pylori, and ingestion of these prior to H. pylori diagnostic testing may lead to false negative results. If clinically indicated, the test may  be repeated on a new specimen obtained two weeks after discontinuing treatment.     ANA     Status: None   Collection Time: 07/08/15  9:03 AM  Result Value Ref Range   Anit Nuclear Antibody(ANA) NEG NEGATIVE  Rheumatoid factor     Status: None   Collection Time: 07/08/15  9:03 AM  Result Value Ref Range   Rhuematoid fact SerPl-aCnc <10 <=14 IU/mL    Comment:                            Interpretive Table                     Low Positive: 15 - 41 IU/mL                     High Positive:  >= 42 IU/mL    In addition to the RF result, and clinical symptoms including joint  involvement, the 2010 ACR Classification Criteria for  scoring/diagnosing Rheumatoid Arthritis include the results of the  following tests:  CRP (20254), ESR (15010), and CCP (APCA) (27062).  www.rheumatology.org/practice/clinical/classification/ra/ra_2010.asp   Sedimentation rate     Status: Abnormal   Collection Time: 07/08/15  9:03 AM  Result Value Ref Range   Sed Rate 28 (H) 0 - 22 mm/hr  C-reactive protein     Status: None   Collection Time: 07/08/15  9:03 AM  Result Value Ref Range   CRP 3.0 0.5 - 20.0 mg/dL    Assessment/Plan: Myalgia and myositis Patient has seen Rheumatology. Had had lab  workup and imaging, none available to view today. Was told she had fibromyalgia. This is a definite possibility giving number of tender points on exam (just underneath diagnostic threshold). Will obtain records. Will begin Cymbalta and Tramadol ER. Follow-up 3-4 weeks.

## 2015-08-04 NOTE — Progress Notes (Signed)
Pre visit review using our clinic review tool, if applicable. No additional management support is needed unless otherwise documented below in the visit note. 

## 2015-08-26 NOTE — Telephone Encounter (Signed)
Pre-VIsit Information

## 2015-09-08 ENCOUNTER — Other Ambulatory Visit: Payer: Self-pay | Admitting: Physician Assistant

## 2015-10-06 ENCOUNTER — Other Ambulatory Visit: Payer: Self-pay | Admitting: Physician Assistant

## 2015-10-26 ENCOUNTER — Ambulatory Visit: Payer: 59 | Admitting: Physician Assistant

## 2015-10-27 ENCOUNTER — Encounter: Payer: Self-pay | Admitting: Physician Assistant

## 2015-10-27 ENCOUNTER — Ambulatory Visit (INDEPENDENT_AMBULATORY_CARE_PROVIDER_SITE_OTHER): Payer: BLUE CROSS/BLUE SHIELD | Admitting: Physician Assistant

## 2015-10-27 ENCOUNTER — Other Ambulatory Visit (HOSPITAL_COMMUNITY)
Admission: RE | Admit: 2015-10-27 | Discharge: 2015-10-27 | Disposition: A | Discharge status: Home / Self Care | Payer: BLUE CROSS/BLUE SHIELD | Source: Ambulatory Visit | Attending: Physician Assistant | Admitting: Physician Assistant

## 2015-10-27 VITALS — BP 122/90 | HR 88 | Temp 98.1°F | Ht 67.0 in | Wt 259.0 lb

## 2015-10-27 DIAGNOSIS — N941 Unspecified dyspareunia: Secondary | ICD-10-CM | POA: Diagnosis not present

## 2015-10-27 DIAGNOSIS — N760 Acute vaginitis: Secondary | ICD-10-CM | POA: Insufficient documentation

## 2015-10-27 DIAGNOSIS — R102 Pelvic and perineal pain: Secondary | ICD-10-CM

## 2015-10-27 DIAGNOSIS — N949 Unspecified condition associated with female genital organs and menstrual cycle: Secondary | ICD-10-CM | POA: Diagnosis not present

## 2015-10-27 LAB — POC URINALSYSI DIPSTICK (AUTOMATED)
BILIRUBIN UA: NEGATIVE
Glucose, UA: NEGATIVE
KETONES UA: NEGATIVE
LEUKOCYTES UA: NEGATIVE
Nitrite, UA: NEGATIVE
PH UA: 6
PROTEIN UA: NEGATIVE
RBC UA: NEGATIVE
Urobilinogen, UA: 0.2

## 2015-10-27 MED ORDER — DULOXETINE HCL 60 MG PO CPEP: 60.0000 mg | ORAL_CAPSULE | Freq: Every day | ORAL | Status: DC

## 2015-10-27 NOTE — Patient Instructions (Signed)
Stop Gabapentin and begin increased dose of Cymbalta.  Your urine looks good. I will call with vaginal swab results. Stop by front desk to speak with Marj or Jen to schedule your [place]. I will call with results and we will decide on treatment.  I encourage you to increase hydration and the amount of fiber in your diet.  Start a daily probiotic (Align, Culturelle, Digestive Advantage, etc.). If no bowel movement within 24 hours, take 2 Tbs of Milk of Magnesia in a 4 oz glass of warmed prune juice every 2-3 days to help promote bowel movement. If no results within 24 hours, then repeat above regimen, adding a Dulcolax stool softener to regimen. If this does not promote a bowel movement, please call the office.

## 2015-10-27 NOTE — Assessment & Plan Note (Signed)
With tenderness of R adnexa on exam. Mild thin discharge noted. Not CMT on exam. No adnexal masses noted. Questionable mild hydrocele.  Urine dip negative. Wet prep obtained today. Will obtain US pelvic and Transvaginal OB. Supportive measures reviewed. Will treat based on findings.

## 2015-10-27 NOTE — Progress Notes (Signed)
Pre visit review using our clinic review tool, if applicable. No additional management support is needed unless otherwise documented below in the visit note. 

## 2015-10-27 NOTE — Progress Notes (Signed)
Patient presents to clinic today c/o dyspareunia x 3 months with pain described as sharp and located in R pelvic region. Endorses pelvic pain 2 days ago without intercourse. Denies urinary urgency, frequency, hematuria, fever, chills, vaginal bleeding. Endorses mild constipation.   Past Medical History  Diagnosis Date  . History of chicken pox   . Seizures ([place])     Pt states she had a couple of seizures in high school of undetermined cause  . Asymptomatic varicose veins   . Obesity   . Edema   . Unspecified sinusitis (chronic)   . Anemia 05/17/2011    Current Outpatient Prescriptions on File Prior to Visit  Medication Sig Dispense Refill  . furosemide (LASIX) 20 MG tablet TAKE ONE TABLET BY MOUTH ONCE DAILY 30 tablet 3  . gabapentin (NEURONTIN) 300 MG capsule TAKE ONE CAPSULE BY MOUTH AT BEDTIME 30 capsule 3  . ibuprofen (ADVIL,MOTRIN) 200 MG tablet Take 200 mg by mouth every 6 (six) hours as needed for moderate pain.     Marland Kitchen loratadine-pseudoephedrine (CLARITIN-D 24 HOUR) 10-240 MG per 24 hr tablet Take 1 tablet by mouth daily. 60 tablet 0  . meloxicam (MOBIC) 15 MG tablet TAKE ONE TABLET BY MOUTH ONCE DAILY 30 tablet 0  . Multiple Vitamin (MULTIVITAMIN WITH MINERALS) TABS tablet Take 1 tablet by mouth daily.    Marland Kitchen OVER THE COUNTER MEDICATION Fish Oil 1000mg  with Omega-3 300mg -Take 1 capsule twice a day.    . TraMADol HCl 150 MG CP24 Take 150 mg by mouth daily. 30 capsule 2  . cyclobenzaprine (FLEXERIL) 10 MG tablet TAKE ONE TABLET BY MOUTH AT BEDTIME AS NEEDED FOR MUSCLE SPASM (Patient not taking: Reported on 10/27/2015) 21 tablet 0  . valACYclovir (VALTREX) 1000 MG tablet Take 1 tablet (1,000 mg total) by mouth 3 (three) times daily. (Patient not taking: Reported on 08/04/2015) 21 tablet 0   No current facility-administered medications on file prior to visit.    No Known Allergies  Family History  Problem Relation Age of Onset  . Arthritis Mother   . Hypertension Mother   .  Diabetes Mother   . Asthma Mother   . Hypertension Father   . Diabetes Maternal Grandmother   . Hypertension Maternal Grandmother   . Heart disease Maternal Grandmother   . Diabetes Paternal Grandmother   . Cancer Paternal Grandfather     lung  . Coronary artery disease Brother 5    MI    Social History   Social History  . Marital Status: Married    Spouse Name: N/A  . Number of Children: 6  . Years of Education: N/A   Social History Main Topics  . Smoking status: Never Smoker   . Smokeless tobacco: Never Used  . Alcohol Use: No  . Drug Use: No  . Sexual Activity: Yes   Other Topics Concern  . None   Social History Narrative   Regular exercise:  No   Caffeine Use:  No   3 biological children, 3 [place]- two children   [place]- Daughter Larene [place] (three children)      Etoh- denies   Never smoked.   Denies drug use      [employer].      Married to [employer]   Review of Systems - See HPI.  All other ROS are negative.  BP 122/90 mmHg  Pulse 88  Temp(Src) 98.1  F (36.7 C) (Oral)  Ht 5\' 7"  (1.702 m)  Wt 259 lb (117.482 kg)  BMI 40.56 kg/m2  SpO2 98%  LMP 04/15/2013  Physical Exam  Constitutional: She is oriented to person, place, and time and well-developed, well-nourished, and in no distress.  HENT:  Head: Normocephalic and atraumatic.  Eyes: Conjunctivae are normal.  Neck: Neck supple.  Cardiovascular: Normal rate, normal heart sounds and intact distal pulses.   Pulmonary/Chest: Effort normal and breath sounds normal. No respiratory distress. She has no wheezes. She has no rales. She exhibits no tenderness.  Genitourinary: Left adnexa normal. Cervix exhibits no motion tenderness. Right adnexum displays tenderness. Right adnexum displays no deviation and no mass. Thin  white and vaginal discharge found.  Questionable mild rectocele  Neurological: She is alert and  oriented to person, place, and time.  Skin: Skin is warm and dry. No rash noted.  Psychiatric: Affect normal.  Vitals reviewed.  Assessment/Plan: Dyspareunia, female With tenderness of R adnexa on exam. Mild thin discharge noted. Not CMT on exam. No adnexal masses noted. Questionable mild hydrocele.  Urine dip negative. Wet prep obtained today. Will obtain US pelvic and Transvaginal OB. Supportive measures reviewed. Will treat based on findings.

## 2015-10-28 ENCOUNTER — Telehealth: Payer: Self-pay | Admitting: *Deleted

## 2015-10-28 ENCOUNTER — Ambulatory Visit (HOSPITAL_BASED_OUTPATIENT_CLINIC_OR_DEPARTMENT_OTHER)
Admission: RE | Admit: 2015-10-28 | Discharge: 2015-10-28 | Disposition: A | Discharge status: Home / Self Care | Payer: BLUE CROSS/BLUE SHIELD | Source: Ambulatory Visit | Attending: Physician Assistant | Admitting: Physician Assistant

## 2015-10-28 DIAGNOSIS — N83201 Unspecified ovarian cyst, right side: Secondary | ICD-10-CM | POA: Diagnosis not present

## 2015-10-28 DIAGNOSIS — N949 Unspecified condition associated with female genital organs and menstrual cycle: Secondary | ICD-10-CM | POA: Diagnosis not present

## 2015-10-28 DIAGNOSIS — R938 Abnormal findings on diagnostic imaging of other specified body structures: Secondary | ICD-10-CM | POA: Diagnosis not present

## 2015-10-28 DIAGNOSIS — D259 Leiomyoma of uterus, unspecified: Secondary | ICD-10-CM

## 2015-10-28 DIAGNOSIS — R102 Pelvic and perineal pain: Secondary | ICD-10-CM

## 2015-10-28 DIAGNOSIS — N941 Unspecified dyspareunia: Secondary | ICD-10-CM

## 2015-10-28 LAB — CERVICOVAGINAL ANCILLARY ONLY: Wet Prep (BD Affirm): NEGATIVE

## 2015-10-28 NOTE — Telephone Encounter (Signed)
-----   Message from Brunetta Jeans, PA-C sent at 10/28/2015  9:52 AM EDT ----- US reveals uterine fibroids and cyst on R ovary (benign appearing). Giving pain with intercourse, recommend we set her up with GYN for further assessment and management.

## 2015-10-29 NOTE — Telephone Encounter (Signed)
Called the patient left message to call back 

## 2015-10-30 NOTE — Telephone Encounter (Signed)
Called and spoke with the pt and informed her of the recent US results and note.  Pt verbalized understanding and agreed to the referral to GYN.  Referral placed and sent.//AB/CMA

## 2015-11-02 ENCOUNTER — Other Ambulatory Visit: Payer: Self-pay | Admitting: Physician Assistant

## 2015-11-02 NOTE — Telephone Encounter (Signed)
Medication filled to pharmacy as requested.   

## 2015-11-09 ENCOUNTER — Ambulatory Visit (INDEPENDENT_AMBULATORY_CARE_PROVIDER_SITE_OTHER): Payer: BLUE CROSS/BLUE SHIELD | Admitting: Physician Assistant

## 2015-11-09 ENCOUNTER — Encounter: Payer: Self-pay | Admitting: Physician Assistant

## 2015-11-09 VITALS — BP 120/88 | HR 90 | Temp 98.1°F | Ht 67.0 in | Wt 255.4 lb

## 2015-11-09 DIAGNOSIS — J069 Acute upper respiratory infection, unspecified: Secondary | ICD-10-CM

## 2015-11-09 NOTE — Progress Notes (Signed)
Patient presents to clinic today c/o 1.5 days of PND, sore throat and R ear pain. Denies fever, chills, chest congestion or cough. Denies sinus pressure or pain. Does note flare up of fibromyalgia this weekend prior to symptom onset.  Past Medical History  Diagnosis Date  . History of chicken pox   . Seizures ([place])     Pt states she had a couple of seizures in high school of undetermined cause  . Asymptomatic varicose veins   . Obesity   . Edema   . Unspecified sinusitis (chronic)   . Anemia 05/17/2011    Current Outpatient Prescriptions on File Prior to Visit  Medication Sig Dispense Refill  . cyclobenzaprine (FLEXERIL) 10 MG tablet TAKE ONE TABLET BY MOUTH AT BEDTIME AS NEEDED FOR MUSCLE SPASM 21 tablet 0  . DULoxetine (CYMBALTA) 60 MG capsule Take 1 capsule (60 mg total) by mouth daily. 30 capsule 3  . furosemide (LASIX) 20 MG tablet TAKE ONE TABLET BY MOUTH ONCE DAILY 30 tablet 3  . ibuprofen (ADVIL,MOTRIN) 200 MG tablet Take 200 mg by mouth every 6 (six) hours as needed for moderate pain.     Marland Kitchen loratadine-pseudoephedrine (CLARITIN-D 24 HOUR) 10-240 MG per 24 hr tablet Take 1 tablet by mouth daily. 60 tablet 0  . meloxicam (MOBIC) 15 MG tablet TAKE ONE TABLET BY MOUTH ONCE DAILY 30 tablet 0  . Multiple Vitamin (MULTIVITAMIN WITH MINERALS) TABS tablet Take 1 tablet by mouth daily.    Marland Kitchen OVER THE COUNTER MEDICATION Fish Oil 1000mg  with Omega-3 300mg -Take 1 capsule twice a day.    . TraMADol HCl 150 MG CP24 Take 150 mg by mouth daily. 30 capsule 2  . gabapentin (NEURONTIN) 300 MG capsule TAKE ONE CAPSULE BY MOUTH AT BEDTIME (Patient not taking: Reported on 11/09/2015) 30 capsule 3   No current facility-administered medications on file prior to visit.    No Known Allergies  Family History  Problem Relation Age of Onset  . Arthritis Mother   . Hypertension Mother   . Diabetes Mother   . Asthma Mother   . Hypertension Father   . Diabetes Maternal Grandmother   . Hypertension  Maternal Grandmother   . Heart disease Maternal Grandmother   . Diabetes Paternal Grandmother   . Cancer Paternal Grandfather     lung  . Coronary artery disease Brother 87    MI    Social History   Social History  . Marital Status: Married    Spouse Name: N/A  . Number of Children: 6  . Years of Education: N/A   Social History Main Topics  . Smoking status: Never Smoker   . Smokeless tobacco: Never Used  . Alcohol Use: No  . Drug Use: No  . Sexual Activity: Yes   Other Topics Concern  . None   Social History Narrative   Regular exercise:  No   Caffeine Use:  No   3 biological children, 3 [place]- two children   [place]- Daughter Larene [place] (three children)      Etoh- denies   Never smoked.   Denies drug use      [place] [employer].      Married to [employer]    Review of Systems - See HPI.  All other ROS are negative.  BP 120/88 mmHg  Pulse 90  Temp(Src) 98.1 F (36.7 C) (Oral)  Ht 5\' 7"  (1.702  m)  Wt 255 lb 6.4 oz (115.849 kg)  BMI 39.99 kg/m2  SpO2 98%  LMP 04/15/2013  Physical Exam  Constitutional: She is oriented to person, place, and time and well-developed, well-nourished, and in no distress.  HENT:  Head: Normocephalic and atraumatic.  Right Ear: Tympanic membrane is not erythematous, not retracted and not bulging. A middle ear effusion is present.  Left Ear: Tympanic membrane normal.  Nose: Nose normal.  Mouth/Throat: Uvula is midline, oropharynx is clear and moist and mucous membranes are normal.  Eyes: Conjunctivae are normal.  Neck: Neck supple.  Cardiovascular: Normal rate, regular rhythm, normal heart sounds and intact distal pulses.   Pulmonary/Chest: Effort normal and breath sounds normal. No respiratory distress. She has no wheezes. She has no rales. She exhibits no tenderness.  Lymphadenopathy:    She has no cervical adenopathy.  Neurological: She is alert  and oriented to person, place, and time.  Skin: Skin is warm and dry. No rash noted.  Psychiatric: Affect normal.  Vitals reviewed.   Recent Results (from the past 2160 hour(s))  Cervicovaginal ancillary only     Status: None   Collection Time: 10/27/15 12:00 AM  Result Value Ref Range   Wet Prep (BD Affirm) Negative for Candida, Gardnerella, & Trichomonas     Comment: Normal Reference Range - Negative  POCT Urinalysis Dipstick (Automated)     Status: None   Collection Time: 10/27/15 11:38 AM  Result Value Ref Range   Color, UA yelllow    Clarity, UA clear    Glucose, UA neg    Bilirubin, UA neg    Ketones, UA neg    Spec Grav, UA >=1.030    Blood, UA neg    pH, UA 6.0    Protein, UA neg    Urobilinogen, UA 0.2    Nitrite, UA neg    Leukocytes, UA Negative Negative    Assessment/Plan: Viral URI Only 1-1.5 days of symptoms. Rapid strep negative. Some eustachian tube dysfunction present with mild fluid buildup behind R TM. Rx Flonase. Increase fluids and place humidifier in bedroom. Supportive measures reviewed. Follow-up if not resolving in 4-5 days.

## 2015-11-09 NOTE — Progress Notes (Signed)
Pre visit review using our clinic review tool, if applicable. No additional management support is needed unless otherwise documented below in the visit note. 

## 2015-11-09 NOTE — Patient Instructions (Signed)
Symptoms and exam are consistent with a viral upper respiratory infection. Strep swab is negative.  Increase fluids. Tylenol for throat pain. Salt-water gargles will also be beneficial for throat pain. Get plenty of rest. Place a humidifier in the bedroom. Get some over-the-counter Flonase nasal spray to help pull fluid off of ears.  Follow-up if symptoms are not resolving.

## 2015-11-10 DIAGNOSIS — J069 Acute upper respiratory infection, unspecified: Secondary | ICD-10-CM | POA: Insufficient documentation

## 2015-11-10 NOTE — Assessment & Plan Note (Signed)
Only 1-1.5 days of symptoms. Rapid strep negative. Some eustachian tube dysfunction present with mild fluid buildup behind R TM. Rx Flonase. Increase fluids and place humidifier in bedroom. Supportive measures reviewed. Follow-up if not resolving in 4-5 days.

## 2015-11-12 ENCOUNTER — Encounter: Payer: Self-pay | Admitting: Obstetrics & Gynecology

## 2015-11-12 ENCOUNTER — Encounter: Payer: BLUE CROSS/BLUE SHIELD | Admitting: Obstetrics & Gynecology

## 2015-11-12 ENCOUNTER — Ambulatory Visit (INDEPENDENT_AMBULATORY_CARE_PROVIDER_SITE_OTHER): Payer: BLUE CROSS/BLUE SHIELD | Admitting: Obstetrics & Gynecology

## 2015-11-12 VITALS — BP 129/67 | HR 77 | Ht 67.0 in | Wt 254.0 lb

## 2015-11-12 DIAGNOSIS — Z0000 Encounter for general adult medical examination without abnormal findings: Secondary | ICD-10-CM

## 2015-11-12 DIAGNOSIS — N83201 Unspecified ovarian cyst, right side: Secondary | ICD-10-CM

## 2015-11-12 DIAGNOSIS — Z124 Encounter for screening for malignant neoplasm of cervix: Secondary | ICD-10-CM

## 2015-11-12 DIAGNOSIS — N941 Unspecified dyspareunia: Secondary | ICD-10-CM

## 2015-11-12 DIAGNOSIS — Z1151 Encounter for screening for human papillomavirus (HPV): Secondary | ICD-10-CM

## 2015-11-12 NOTE — Progress Notes (Signed)
   Subjective:    Patient ID: Alexandra Mata, female    DOB: 1966-08-05, 50 y.o.   MRN: [id number]  HPI 50 MW P3 (30, 75, and 33 yo kids) here today for the issue of pelvic "cramping", almost like a menstrual cramp. This just started a few weeks ago. She has had a 4 month h/o dysparuenia, like a stabbing pain, during sex and 24 hours worth of post coital vaginal "burning".  She has not had a period for about 2 years but denies vaginal dryness. She doesn't use any lubricants.   Review of Systems Last pap smear 2012, h/o abnormal paps "every time" Mammogram UTD Declines a flu vaccine Colonoscopy due    Objective:   Physical Exam  Pleasant WFNAD Breathing, conversing, and ambulating normally Abd- obese Cervix appears normal Uterus mobile, not particularly tender, 8 week size Adnexa not palpable      Assessment & Plan:  Preventative care- pap smear today with cotesting Pelvic pain- I don't think that her fibroid (exophytic) is the cause of her NEW pelvic pain. We discuss this along with the risks of hysterectomy (TAH/BSO) and that we should not do surgery unless all other causes have been ruled out. She is due to colonoscopy and I have suggested this asap. Rec vaginal lube to see if this prevents the post coital burning Repeat gyn u/s in 3-4 months to follow up on right ovarian benign cyst

## 2015-11-16 ENCOUNTER — Ambulatory Visit (AMBULATORY_SURGERY_CENTER): Payer: Self-pay | Admitting: *Deleted

## 2015-11-16 VITALS — Ht 67.0 in | Wt 253.0 lb

## 2015-11-16 DIAGNOSIS — Z1211 Encounter for screening for malignant neoplasm of colon: Secondary | ICD-10-CM

## 2015-11-16 LAB — CYTOLOGY - PAP

## 2015-11-16 NOTE — Progress Notes (Signed)
No egg or soy allergy known to patient  No issues with past sedation with any surgeries  or procedures, no intubation problems  No diet pills per patient No home 02 use per patient  No blood thinners per patient  Pt denies issues with constipation   

## 2015-11-20 ENCOUNTER — Other Ambulatory Visit: Payer: Self-pay | Admitting: Physician Assistant

## 2015-11-20 NOTE — Telephone Encounter (Signed)
Tramadol last refilled on 08/04/2015  #30 with 2 refills meloxicam last refilled on 11/02/2015  #30 with 0 refills Last office visit 11/09/2015

## 2015-11-23 NOTE — Telephone Encounter (Addendum)
Rx for Meloxicam filled and sent to the pharmacy by e-script.   Rx for the Tramadol was printed and faxed to the pharmacy.   Confirmation received.//AB/CMA

## 2015-11-30 ENCOUNTER — Ambulatory Visit (AMBULATORY_SURGERY_CENTER): Payer: BLUE CROSS/BLUE SHIELD | Admitting: Gastroenterology

## 2015-11-30 ENCOUNTER — Encounter: Payer: Self-pay | Admitting: Gastroenterology

## 2015-11-30 VITALS — BP 136/93 | HR 67 | Temp 97.0°F | Resp 19 | Ht 67.0 in | Wt 253.0 lb

## 2015-11-30 DIAGNOSIS — Z1211 Encounter for screening for malignant neoplasm of colon: Secondary | ICD-10-CM | POA: Diagnosis not present

## 2015-11-30 DIAGNOSIS — D125 Benign neoplasm of sigmoid colon: Secondary | ICD-10-CM | POA: Diagnosis not present

## 2015-11-30 MED ORDER — SODIUM CHLORIDE 0.9 % IV SOLN: 500.0000 mL | INTRAVENOUS | Status: DC

## 2015-11-30 NOTE — Patient Instructions (Signed)
Discharge instructions given. Handouts on polyps and diverticulosis. Resume previous medications. YOU HAD AN ENDOSCOPIC PROCEDURE TODAY AT THE [facility]:   Refer to the procedure report that was given to you for any specific questions about what was found during the examination.  If the procedure report does not answer your questions, please call your gastroenterologist to clarify.  If you requested that your care partner not be given the details of your procedure findings, then the procedure report has been included in a sealed envelope for you to review at your convenience later.  YOU SHOULD EXPECT: Some feelings of bloating in the abdomen. Passage of more gas than usual.  Walking can help get rid of the air that was put into your GI tract during the procedure and reduce the bloating. If you had a lower endoscopy (such as a colonoscopy or flexible sigmoidoscopy) you may notice spotting of blood in your stool or on the toilet paper. If you underwent a bowel prep for your procedure, you may not have a normal bowel movement for a few days.  Please Note:  You might notice some irritation and congestion in your nose or some drainage.  This is from the oxygen used during your procedure.  There is no need for concern and it should clear up in a day or so.  SYMPTOMS TO REPORT IMMEDIATELY:   Following lower endoscopy (colonoscopy or flexible sigmoidoscopy):  Excessive amounts of blood in the stool  Significant tenderness or worsening of abdominal pains  Swelling of the abdomen that is new, acute  Fever of 100F or higher   For urgent or emergent issues, a gastroenterologist can be reached at any hour by calling [phone].   DIET: Your first meal following the procedure should be a small meal and then it is ok to progress to your normal diet. Heavy or fried foods are harder to digest and may make you feel nauseous or bloated.  Likewise, meals heavy in dairy and vegetables can  increase bloating.  Drink plenty of fluids but you should avoid alcoholic beverages for 24 hours.  ACTIVITY:  You should plan to take it easy for the rest of today and you should NOT DRIVE or use heavy machinery until tomorrow (because of the sedation medicines used during the test).    FOLLOW UP: Our staff will call the number listed on your records the next business day following your procedure to check on you and address any questions or concerns that you may have regarding the information given to you following your procedure. If we do not reach you, we will leave a message.  However, if you are feeling well and you are not experiencing any problems, there is no need to return our call.  We will assume that you have returned to your regular daily activities without incident.  If any biopsies were taken you will be contacted by phone or by letter within the next 1-3 weeks.  Please call us at [phone] if you have not heard about the biopsies in 3 weeks.    SIGNATURES/CONFIDENTIALITY: You and/or your care partner have signed paperwork which will be entered into your electronic medical record.  These signatures attest to the fact that that the information above on your After Visit Summary has been reviewed and is understood.  Full responsibility of the confidentiality of this discharge information lies with you and/or your care-partner. 

## 2015-11-30 NOTE — Progress Notes (Signed)
Report given to PACU RN, vss 

## 2015-11-30 NOTE — Op Note (Signed)
[facility] Patient Name: Alexandra Mata Procedure Date: 11/30/2015 1:23 PM MRN: [id number] Endoscopist: Mallie Mussel L. Loletha Carrow , MD Age: 50 Date of Birth: 1965-10-05 Gender: Female Procedure:                Colonoscopy Indications:              Screening for colorectal malignant neoplasm Medicines:                Monitored Anesthesia Care Procedure:                Pre-Anesthesia Assessment:                           - Prior to the procedure, a History and Physical                            was performed, and patient medications and                            allergies were reviewed. The patient's tolerance of                            previous anesthesia was also reviewed. The risks                            and benefits of the procedure and the sedation                            options and risks were discussed with the patient.                            All questions were answered, and informed consent                            was obtained. Prior Anticoagulants: The patient has                            taken no previous anticoagulant or antiplatelet                            agents. ASA Grade Assessment: II - A patient with                            mild systemic disease. After reviewing the risks                            and benefits, the patient was deemed in                            satisfactory condition to undergo the procedure.                           After obtaining informed consent, the colonoscope  was passed under direct vision. Throughout the                            procedure, the patient's blood pressure, pulse, and                            oxygen saturations were monitored continuously. The                            Model CF-[id number] scope was introduced                            through the anus and advanced to the the cecum,                            identified by appendiceal orifice and ileocecal                             valve. The colonoscopy was performed without                            difficulty. The patient tolerated the procedure                            well. The quality of the bowel preparation was                            good. The ileocecal valve, appendiceal orifice, and                            rectum were photographed. The quality of the bowel                            preparation was evaluated using the BBPS [facility]                            Bowel Preparation Scale) with scores of: Right                            Colon = 2, Transverse Colon = 2 and Left Colon = 2.                            The total BBPS score equals 6. The bowel                            preparation used was Miralax. Scope In: 1:31:57 PM Scope Out: 1:43:19 PM Scope Withdrawal Time: 0 hours 8 minutes 50 seconds  Total Procedure Duration: 0 hours 11 minutes 22 seconds  Findings:                 The perianal and digital rectal examinations were                            normal.  Multiple medium-mouthed diverticula were found in                            the sigmoid colon.                           A 6 mm polyp was found in the mid sigmoid colon.                            The polyp was sessile. The polyp was removed with a                            cold snare. Resection and retrieval were complete.                           The exam was otherwise without abnormality on                            direct and retroflexion views. Complications:            No immediate complications. Estimated Blood Loss:     Estimated blood loss: none. Impression:               - Diverticulosis in the sigmoid colon.                           - One 6 mm polyp in the mid sigmoid colon, removed                            with a cold snare. Resected and retrieved.                           - The examination was otherwise normal on direct                            and retroflexion  views. Recommendation:           - Patient has a contact number available for                            emergencies. The signs and symptoms of potential                            delayed complications were discussed with the                            patient. Return to normal activities tomorrow.                            Written discharge instructions were provided to the                            patient.                           - Resume  previous diet.                           - Continue present medications.                           - Await pathology results.                           - Repeat colonoscopy is recommended for                            surveillance. The colonoscopy date will be                            determined after pathology results from today's                            exam become available for review. Henry L. Loletha Carrow, MD 11/30/2015 1:46:27 PM This report has been signed electronically.

## 2015-11-30 NOTE — Progress Notes (Signed)
Called to room to assist during endoscopic procedure.  Patient ID and intended procedure confirmed with present staff. Received instructions for my participation in the procedure from the performing physician.  

## 2015-12-01 ENCOUNTER — Telehealth: Payer: Self-pay

## 2015-12-01 NOTE — Telephone Encounter (Signed)
  Follow up Call-  Call back number 11/30/2015  Post procedure Call Back phone  # [phone]  Permission to leave phone message Yes     Patient questions:  Do you have a fever, pain , or abdominal swelling? No. Pain Score  0 *  Have you tolerated food without any problems? Yes.    Have you been able to return to your normal activities? Yes.    Do you have any questions about your discharge instructions: Diet   No. Medications  No. Follow up visit  No.  Do you have questions or concerns about your Care? No.  Actions: * If pain score is 4 or above: No action needed, pain <4.

## 2015-12-03 ENCOUNTER — Encounter: Payer: Self-pay | Admitting: Gastroenterology

## 2016-02-04 ENCOUNTER — Emergency Department (HOSPITAL_COMMUNITY)
Admission: EM | Admit: 2016-02-04 | Discharge: 2016-02-04 | Disposition: A | Discharge status: Home / Self Care | Payer: BLUE CROSS/BLUE SHIELD | Attending: Emergency Medicine | Admitting: Emergency Medicine

## 2016-02-04 ENCOUNTER — Other Ambulatory Visit: Payer: Self-pay

## 2016-02-04 ENCOUNTER — Encounter (HOSPITAL_COMMUNITY): Payer: Self-pay | Admitting: *Deleted

## 2016-02-04 ENCOUNTER — Emergency Department (HOSPITAL_COMMUNITY): Payer: BLUE CROSS/BLUE SHIELD

## 2016-02-04 ENCOUNTER — Emergency Department (HOSPITAL_BASED_OUTPATIENT_CLINIC_OR_DEPARTMENT_OTHER)
Admit: 2016-02-04 | Discharge: 2016-02-04 | Disposition: A | Discharge status: Home / Self Care | Payer: BLUE CROSS/BLUE SHIELD | Attending: Emergency Medicine | Admitting: Emergency Medicine

## 2016-02-04 DIAGNOSIS — M79609 Pain in unspecified limb: Secondary | ICD-10-CM

## 2016-02-04 DIAGNOSIS — R000 Tachycardia, unspecified: Secondary | ICD-10-CM | POA: Diagnosis present

## 2016-02-04 DIAGNOSIS — R0602 Shortness of breath: Secondary | ICD-10-CM | POA: Insufficient documentation

## 2016-02-04 DIAGNOSIS — Z79899 Other long term (current) drug therapy: Secondary | ICD-10-CM | POA: Insufficient documentation

## 2016-02-04 DIAGNOSIS — I4891 Unspecified atrial fibrillation: Secondary | ICD-10-CM | POA: Insufficient documentation

## 2016-02-04 LAB — COMPREHENSIVE METABOLIC PANEL WITH GFR
ALT: 20 U/L (ref 14–54)
AST: 20 U/L (ref 15–41)
Albumin: 4.1 g/dL (ref 3.5–5.0)
Alkaline Phosphatase: 87 U/L (ref 38–126)
Anion gap: 9 (ref 5–15)
BUN: 20 mg/dL (ref 6–20)
CO2: 25 mmol/L (ref 22–32)
Calcium: 10 mg/dL (ref 8.9–10.3)
Chloride: 102 mmol/L (ref 101–111)
Creatinine, Ser: 0.73 mg/dL (ref 0.44–1.00)
GFR calc Af Amer: >60 mL/min
GFR calc non Af Amer: >60 mL/min
Glucose, Bld: 93 mg/dL (ref 65–99)
Potassium: 4.3 mmol/L (ref 3.5–5.1)
Sodium: 136 mmol/L (ref 135–145)
Total Bilirubin: 0.1 mg/dL — ABNORMAL LOW (ref 0.3–1.2)
Total Protein: 7.8 g/dL (ref 6.5–8.1)

## 2016-02-04 LAB — CBC WITH DIFFERENTIAL/PLATELET
BASOS PCT: 0 %
Basophils Absolute: 0 10*3/uL (ref 0.0–0.1)
EOS ABS: 0.2 10*3/uL (ref 0.0–0.7)
Eosinophils Relative: 1 %
HEMATOCRIT: 42.1 % (ref 36.0–46.0)
Hemoglobin: 13.3 g/dL (ref 12.0–15.0)
Lymphocytes Relative: 27 %
Lymphs Abs: 3.5 10*3/uL (ref 0.7–4.0)
MCH: 27.7 pg (ref 26.0–34.0)
MCHC: 31.6 g/dL (ref 30.0–36.0)
MCV: 87.7 fL (ref 78.0–100.0)
MONO ABS: 1 10*3/uL (ref 0.1–1.0)
MONOS PCT: 7 %
Neutro Abs: 8.6 10*3/uL — ABNORMAL HIGH (ref 1.7–7.7)
Neutrophils Relative %: 65 %
Platelets: 283 10*3/uL (ref 150–400)
RBC: 4.8 MIL/uL (ref 3.87–5.11)
RDW: 13.6 % (ref 11.5–15.5)
WBC: 13.3 10*3/uL — ABNORMAL HIGH (ref 4.0–10.5)

## 2016-02-04 LAB — I-STAT TROPONIN, ED: Troponin i, poc: 0.01 ng/mL (ref 0.00–0.08)

## 2016-02-04 LAB — MAGNESIUM: Magnesium: 1.8 mg/dL (ref 1.7–2.4)

## 2016-02-04 LAB — BRAIN NATRIURETIC PEPTIDE: B NATRIURETIC PEPTIDE 5: 48.3 pg/mL (ref 0.0–100.0)

## 2016-02-04 MED ORDER — RIVAROXABAN 20 MG PO TABS
20.0000 mg | ORAL_TABLET | Freq: Once | ORAL | Status: AC
  Administered 2016-02-04: 20 mg via ORAL
  Filled 2016-02-04: qty 1

## 2016-02-04 MED ORDER — MIDAZOLAM HCL 2 MG/2ML IJ SOLN
5.0000 mg | Freq: Once | INTRAMUSCULAR | Status: AC
  Administered 2016-02-04: 5 mg via INTRAVENOUS

## 2016-02-04 MED ORDER — RIVAROXABAN 20 MG PO TABS: 20.0000 mg | ORAL_TABLET | Freq: Every day | ORAL | Status: DC

## 2016-02-04 MED ORDER — MIDAZOLAM HCL 2 MG/2ML IJ SOLN
INTRAMUSCULAR | Status: AC
  Filled 2016-02-04: qty 6

## 2016-02-04 MED ORDER — IBUPROFEN 400 MG PO TABS
600.0000 mg | ORAL_TABLET | Freq: Once | ORAL | Status: AC
  Administered 2016-02-04: 600 mg via ORAL
  Filled 2016-02-04: qty 1

## 2016-02-04 NOTE — ED Notes (Signed)
Pt states she was driving home from work when she began having CP and felt her heart racing. Pt drove herself to the ED. Pt in AFIB RVR upon arrival.

## 2016-02-04 NOTE — ED Notes (Signed)
Consent for synchronized cardioversion obtained with MD Vanita Panda and MD Marigene Ehlers at bedside with patient.

## 2016-02-04 NOTE — Discharge Instructions (Signed)
Atrial Fibrillation °Atrial fibrillation is a type of irregular or rapid heartbeat (arrhythmia). In atrial fibrillation, the heart quivers continuously in a chaotic pattern. This occurs when parts of the heart receive disorganized signals that make the heart unable to pump blood normally. This can increase the risk for stroke, heart failure, and other heart-related conditions. There are different types of atrial fibrillation, including: °· Paroxysmal atrial fibrillation. This type starts suddenly, and it usually stops on its own shortly after it starts. °· Persistent atrial fibrillation. This type often lasts longer than a week. It may stop on its own or with treatment. °· Long-lasting persistent atrial fibrillation. This type lasts longer than 12 months. °· Permanent atrial fibrillation. This type does not go away. °Talk with your health care provider to learn about the type of atrial fibrillation that you have. °CAUSES °This condition is caused by some heart-related conditions or procedures, including: °· A heart attack. °· Coronary artery disease. °· Heart failure. °· Heart valve conditions. °· High blood pressure. °· Inflammation of the sac that surrounds the heart (pericarditis). °· Heart surgery. °· Certain heart rhythm disorders, such as Wolf-Parkinson-White syndrome. °Other causes include: °· Pneumonia. °· Obstructive sleep apnea. °· Blockage of an artery in the lungs (pulmonary embolism, or PE). °· Lung cancer. °· Chronic lung disease. °· Thyroid problems, especially if the thyroid is overactive (hyperthyroidism). °· Caffeine. °· Excessive alcohol use or illegal drug use. °· Use of some medicines, including certain decongestants and diet pills. °Sometimes, the cause cannot be found. °RISK FACTORS °This condition is more likely to develop in: °· People who are older in age. °· People who smoke. °· People who have diabetes mellitus. °· People who are overweight (obese). °· Athletes who exercise  vigorously. °SYMPTOMS °Symptoms of this condition include: °· A feeling that your heart is beating rapidly or irregularly. °· A feeling of discomfort or pain in your chest. °· Shortness of breath. °· Sudden light-headedness or weakness. °· Getting tired easily during exercise. °In some cases, there are no symptoms. °DIAGNOSIS °Your health care provider may be able to detect atrial fibrillation when taking your pulse. If detected, this condition may be diagnosed with: °· An electrocardiogram (ECG). °· A Holter monitor test that records your heartbeat patterns over a 24-hour period. °· Transthoracic echocardiogram (TTE) to evaluate how blood flows through your heart. °· Transesophageal echocardiogram (TEE) to view more detailed images of your heart. °· A stress test. °· Imaging tests, such as a CT scan or chest X-ray. °· Blood tests. °TREATMENT °The main goals of treatment are to prevent blood clots from forming and to keep your heart beating at a normal rate and rhythm. The type of treatment that you receive depends on many factors, such as your underlying medical conditions and how you feel when you are experiencing atrial fibrillation. °This condition may be treated with: °· Medicine to slow down the heart rate, bring the heart's rhythm back to normal, or prevent clots from forming. °· Electrical cardioversion. This is a procedure that resets your heart's rhythm by delivering a controlled, low-energy shock to the heart through your skin. °· Different types of ablation, such as catheter ablation, catheter ablation with pacemaker, or surgical ablation. These procedures destroy the heart tissues that send abnormal signals. When the pacemaker is used, it is placed under your skin to help your heart beat in a regular rhythm. °HOME CARE INSTRUCTIONS °· Take over-the counter and prescription medicines only as told by your health care provider. °·   If your health care provider prescribed a blood-thinning medicine  (anticoagulant), take it exactly as told. Taking too much blood-thinning medicine can cause bleeding. If you do not take enough blood-thinning medicine, you will not have the protection that you need against stroke and other problems.  Do not use tobacco products, including cigarettes, chewing tobacco, and e-cigarettes. If you need help quitting, ask your health care provider.  If you have obstructive sleep apnea, manage your condition as told by your health care provider.  Do not drink alcohol.  Do not drink beverages that contain caffeine, such as coffee, soda, and tea.  Maintain a healthy weight. Do not use diet pills unless your health care provider approves. Diet pills may make heart problems worse.  Follow diet instructions as told by your health care provider.  Exercise regularly as told by your health care provider.  Keep all follow-up visits as told by your health care provider. This is important. PREVENTION  Avoid drinking beverages that contain caffeine or alcohol.  Avoid certain medicines, especially medicines that are used for breathing problems.  Avoid certain herbs and herbal medicines, such as those that contain ephedra or ginseng.  Do not use illegal drugs, such as cocaine and amphetamines.  Do not smoke.  Manage your high blood pressure. SEEK MEDICAL CARE IF:  You notice a change in the rate, rhythm, or strength of your heartbeat.  You are taking an anticoagulant and you notice increased bruising.  You tire more easily when you exercise or exert yourself. SEEK IMMEDIATE MEDICAL CARE IF:  You have chest pain, abdominal pain, sweating, or weakness.  You feel nauseous.  You notice blood in your vomit, bowel movement, or urine.  You have shortness of breath.  You suddenly have swollen feet and ankles.  You feel dizzy.  You have sudden weakness or numbness of the face, arm, or leg, especially on one side of the body.  You have trouble speaking,  trouble understanding, or both (aphasia).  Your face or your eyelid droops on one side. These symptoms may represent a serious problem that is an emergency. Do not wait to see if the symptoms will go away. Get medical help right away. Call your local emergency services (911 in the [place] Do not driRivaroxaban oral tablets What is this medicine? RIVAROXABAN (ri va ROX a ban) is an anticoagulant (blood thinner). It is used to treat blood clots in the lungs or in the veins. It is also used after knee or hip surgeries to prevent blood clots. It is also used to lower the chance of stroke in people with a medical condition called atrial fibrillation. This medicine may be used for other purposes; ask your health care provider or pharmacist if you have questions. What should I tell my health care provider before I take this medicine? They need to know if you have any of these conditions: -bleeding disorders -bleeding in the brain -blood in your stools (black or tarry stools) or if you have blood in your vomit -history of stomach bleeding -kidney disease -liver disease -low blood counts, like low white cell, platelet, or red cell counts -recent or planned spinal or epidural procedure -take medicines that treat or prevent blood clots -an unusual or allergic reaction to rivaroxaban, other medicines, foods, dyes, or preservatives -pregnant or trying to get pregnant -breast-feeding How should I use this medicine? Take this medicine by mouth with a glass of water. Follow the directions on the prescription label. Take your medicine at regular  intervals. Do not take it more often than directed. Do not stop taking except on your doctor's advice. Stopping this medicine may increase your risk of a blood clot. Be sure to refill your prescription before you run out of medicine. If you are taking this medicine after hip or knee replacement surgery, take it with or without food. If you are taking this medicine for  atrial fibrillation, take it with your evening meal. If you are taking this medicine to treat blood clots, take it with food at the same time each day. If you are unable to swallow your tablet, you may crush the tablet and mix it in applesauce. Then, immediately eat the applesauce. You should eat more food right after you eat the applesauce containing the crushed tablet. Talk to your pediatrician regarding the use of this medicine in children. Special care may be needed. Overdosage: If you think you have taken too much of this medicine contact a poison control center or emergency room at once. NOTE: This medicine is only for you. Do not share this medicine with others. What if I miss a dose? If you take your medicine once a day and miss a dose, take the missed dose as soon as you remember. If you take your medicine twice a day and miss a dose, take the missed dose immediately. In this instance, 2 tablets may be taken at the same time. The next day you should take 1 tablet twice a day as directed. What may interact with this medicine? -aspirin and aspirin-like medicines -certain antibiotics like erythromycin, azithromycin, and clarithromycin -certain medicines for fungal infections like ketoconazole and itraconazole -certain medicines for irregular heart beat like amiodarone, quinidine, dronedarone -certain medicines for seizures like carbamazepine, phenytoin -certain medicines that treat or prevent blood clots like warfarin, enoxaparin, and dalteparin -conivaptan -diltiazem -felodipine -indinavir -lopinavir; ritonavir -NSAIDS, medicines for pain and inflammation, like ibuprofen or naproxen -ranolazine -rifampin -ritonavir -St. John's wort -verapamil This list may not describe all possible interactions. Give your health care provider a list of all the medicines, herbs, non-prescription drugs, or dietary supplements you use. Also tell them if you smoke, drink alcohol, or use illegal drugs. Some  items may interact with your medicine. What should I watch for while using this medicine? Visit your doctor or health care professional for regular checks on your progress. Your condition will be monitored carefully while you are receiving this medicine. Notify your doctor or health care professional and seek emergency treatment if you develop breathing problems; changes in vision; chest pain; severe, sudden headache; pain, swelling, warmth in the leg; trouble speaking; sudden numbness or weakness of the face, arm, or leg. These can be signs that your condition has gotten worse. If you are going to have surgery, tell your doctor or health care professional that you are taking this medicine. Tell your health care professional that you use this medicine before you have a spinal or epidural procedure. Sometimes people who take this medicine have bleeding problems around the spine when they have a spinal or epidural procedure. This bleeding is very rare. If you have a spinal or epidural procedure while on this medicine, call your health care professional immediately if you have back pain, numbness or tingling (especially in your legs and feet), muscle weakness, paralysis, or loss of bladder or bowel control. Avoid sports and activities that might cause injury while you are using this medicine. Severe falls or injuries can cause unseen bleeding. Be careful when using sharp tools  or knives. Consider using an Copy. Take special care brushing or flossing your teeth. Report any injuries, bruising, or red spots on the skin to your doctor or health care professional. What side effects may I notice from receiving this medicine? Side effects that you should report to your doctor or health care professional as soon as possible: -allergic reactions like skin rash, itching or hives, swelling of the face, lips, or tongue -back pain -redness, blistering, peeling or loosening of the skin, including inside the  mouth -signs and symptoms of bleeding such as bloody or black, tarry stools; red or dark-brown urine; spitting up blood or brown material that looks like coffee grounds; red spots on the skin; unusual bruising or bleeding from the eye, gums, or nose Side effects that usually do not require medical attention (Report these to your doctor or health care professional if they continue or are bothersome.): -dizziness -muscle pain This list may not describe all possible side effects. Call your doctor for medical advice about side effects. You may report side effects to FDA at 1-800-FDA-1088. Where should I keep my medicine? Keep out of the reach of children. Store at room temperature between 15 and 30 degrees C (59 and 86 degrees F). Throw away any unused medicine after the expiration date. NOTE: This sheet is a summary. It may not cover all possible information. If you have questions about this medicine, talk to your doctor, pharmacist, or health care provider.    2016, Elsevier/[employer] Standard. (2014-07-30 12:45:34) ve yourself to the hospital.   This information is not intended to replace advice given to you by your health care provider. Make sure you discuss any questions you have with your health care provider.   Document Released: 08/01/2005 Document Revised: 04/22/2015 Document Reviewed: 11/26/2014 [employer]vier Interactive Patient Education [employer].

## 2016-02-04 NOTE — ED Provider Notes (Signed)
CSN: [id number]     Arrival date & time 02/04/16  1802 History   First MD Initiated Contact with Patient 02/04/16 1816     Chief Complaint  Patient presents with  . Tachycardia    HPI Comments: 50 y.o. Female with a past medical history of lower extremity edema, on lasix, presents to the ED due to sudden onset palpitations that began about 45 minutes prior to arrival while driving. Associated with chest pain, shortness of breath. Drove herself to the ED where she was found to be in a-fib with RVR. No recent illness, nausea, vomiting, diarrhea, increased leg swelling.   Patient is a 49 y.o. female presenting with palpitations. The history is provided by the patient.  Palpitations Palpitations quality:  Fast Onset quality:  Sudden Duration:  45 minutes Timing:  Constant Progression:  Unchanged Chronicity:  New Context comment:  Spontaneously while driving Relieved by:  Nothing Worsened by:  Nothing Ineffective treatments:  None tried Associated symptoms: chest pain, chest pressure, lower extremity edema and shortness of breath   Associated symptoms: no nausea and no vomiting   Risk factors: no diabetes mellitus, no heart disease, no hx of atrial fibrillation and no hx of DVT     Past Medical History  Diagnosis Date  . History of chicken pox   . Asymptomatic varicose veins   . Obesity   . Edema   . Unspecified sinusitis (chronic)   . Anemia 05/17/2011  . Seizures ([place])     Pt states she had a couple of seizures in high school of undetermined cause-last 27 years ago--  . Pneumonia   . Osteoarthritis   . Fibromyalgia   . Palpitations     improved with cutting out caffeine   Past Surgical History  Procedure Laterality Date  . Tubal ligation  1991    `  . Ablation saphenous vein w/ rfa      3 yrs ago- bilateral   Family History  Problem Relation Age of Onset  . Arthritis Mother   . Hypertension Mother   . Diabetes Mother   . Asthma Mother   . Hypertension Father   .  Diabetes Maternal Grandmother   . Hypertension Maternal Grandmother   . Heart disease Maternal Grandmother   . Diabetes Paternal Grandmother   . Cancer Paternal Grandfather     lung  . Coronary artery disease Brother 41    MI  . Colon cancer Neg Hx   . Colon polyps Neg Hx   . Thyroid cancer Father    Social History  Substance Use Topics  . Smoking status: Never Smoker   . Smokeless tobacco: Never Used  . Alcohol Use: No   OB History    No data available     Review of Systems  Constitutional: Negative for fever and chills.  HENT: Negative for congestion.   Eyes: Negative for redness.  Respiratory: Positive for shortness of breath.   Cardiovascular: Positive for chest pain, palpitations and leg swelling.  Gastrointestinal: Negative for nausea, vomiting and abdominal pain.  Genitourinary: Negative for flank pain.  Musculoskeletal: Negative for gait problem.  Skin: Negative for rash.  Neurological: Negative for syncope.  Psychiatric/Behavioral: Negative for confusion.      Allergies  Review of patient's allergies indicates no known allergies.  Home Medications   Prior to Admission medications   Medication Sig Start Date End Date Taking? Authorizing Provider  cyclobenzaprine (FLEXERIL) 10 MG tablet TAKE ONE TABLET BY MOUTH AT BEDTIME AS NEEDED  FOR MUSCLE SPASM Patient not taking: Reported on 11/16/2015 07/17/15   Brunetta Jeans, PA-C  DULoxetine (CYMBALTA) 60 MG capsule Take 1 capsule (60 mg total) by mouth daily. 10/27/15   Brunetta Jeans, PA-C  furosemide (LASIX) 20 MG tablet TAKE ONE TABLET BY MOUTH ONCE DAILY 10/06/15   Brunetta Jeans, PA-C  ibuprofen (ADVIL,MOTRIN) 200 MG tablet Take 200 mg by mouth every 6 (six) hours as needed for moderate pain.     Historical Provider, MD  loratadine-pseudoephedrine (CLARITIN-D 24 HOUR) 10-240 MG per 24 hr tablet Take 1 tablet by mouth daily. 04/27/15   Brunetta Jeans, PA-C  meloxicam (MOBIC) 15 MG tablet TAKE ONE TABLET BY  MOUTH ONCE DAILY 11/23/15   Brunetta Jeans, PA-C  Multiple Vitamin (MULTIVITAMIN WITH MINERALS) TABS tablet Take 1 tablet by mouth daily.    Historical Provider, MD  OVER THE COUNTER MEDICATION Fish Oil 1000mg  with Omega-3 300mg -Take 1 capsule twice a day.    Historical Provider, MD  traMADol (ULTRAM) 50 MG tablet TAKE ONE TABLET BY MOUTH EVERY 8 HOURS AS DIRECTED 11/23/15   Brunetta Jeans, PA-C  TraMADol HCl 150 MG CP24 Take 150 mg by mouth daily. 08/04/15   Brunetta Jeans, PA-C   BP 110/95 mmHg  Pulse 163  Temp(Src) 97.7 F (36.5 C) (Oral)  Resp 14  SpO2 100%  LMP 04/15/2013 Physical Exam  Constitutional: She is oriented to person, place, and time. She appears well-developed and well-nourished. She appears distressed (appears uncomfortable).  HENT:  Head: Normocephalic and atraumatic.  Right Ear: External ear normal.  Left Ear: External ear normal.  Mouth/Throat: Oropharynx is clear and moist.  Neck: Normal range of motion.  Cardiovascular: An irregularly irregular rhythm present. Tachycardia present.   Pulses:      Radial pulses are 2+ on the right side, and 2+ on the left side.  HR 150-180, a-fib with RVR  Pulmonary/Chest: Effort normal and breath sounds normal. No respiratory distress. She has no wheezes.  Abdominal: Soft. She exhibits no distension. There is no tenderness.  Musculoskeletal:  Bilateral lower extremity edema, unchanged per patient; R > L  Neurological: She is alert and oriented to person, place, and time.  Skin: Skin is warm and dry. She is not diaphoretic. No pallor.  Psychiatric: She has a normal mood and affect.    ED Course  .Cardioversion Date/Time: 02/04/2016 6:41 PM Performed by: Marigene Ehlers Manvi Guilliams Authorized by: Carmin Muskrat Consent: Verbal consent obtained. Risks and benefits: risks, benefits and alternatives were discussed Consent given by: patient Patient understanding: patient states understanding of the procedure being performed Patient  identity confirmed: verbally with patient Time out: Immediately prior to procedure a "time out" was called to verify the correct patient, procedure, equipment, support staff and site/side marked as required. Patient sedated: yes Sedation type: anxiolysis Sedatives: midazolam Cardioversion basis: emergent Pre-procedure rhythm: atrial fibrillation Electrodes: pads Electrodes placed: anterior-posterior Number of attempts: 1 Attempt 1 mode: synchronous Attempt 1 shock (in Joules): 120 Attempt 1 outcome: conversion to normal sinus rhythm Post-procedure rhythm: normal sinus rhythm Complications: no complications Patient tolerance: Patient tolerated the procedure well with no immediate complications   Labs Review Labs Reviewed  CBC WITH DIFFERENTIAL/PLATELET - Abnormal; Notable for the following:    WBC 13.3 (*)    Neutro Abs 8.6 (*)    All other components within normal limits  COMPREHENSIVE METABOLIC PANEL - Abnormal; Notable for the following:    Total Bilirubin 0.1 (*)    All  other components within normal limits  BRAIN NATRIURETIC PEPTIDE  MAGNESIUM  I-STAT TROPOININ, ED    Imaging Review Dg Chest Portable 1 View  02/04/2016  CLINICAL DATA:  Chest pain with tachycardia/AFib. Cardioversion performed. EXAM: PORTABLE CHEST 1 VIEW COMPARISON:  05/23/2015 FINDINGS: Lungs are hypoinflated without consolidation or effusion. No pneumothorax. Cardiomediastinal silhouette and remainder of the exam is unchanged. IMPRESSION: Hypoinflation without acute cardiopulmonary disease. Electronically Signed   By: Marin Olp M.D.   On: 02/04/2016 18:52   I have personally reviewed and evaluated these images and lab results as part of my medical decision-making.   EKG Interpretation   Date/Time:  Thursday February 04 2016 18:33:49 EDT Ventricular Rate:  108 PR Interval:    QRS Duration: 104 QT Interval:  350 QTC Calculation: 470 R Axis:   -16 Text Interpretation:  Sinus tachycardia Consider  right atrial enlargement  Abnormal R-wave progression, early transition Probable left ventricular  hypertrophy now in sinus rhythm compared to prior study. Abnormal ekg  Confirmed by Carmin Muskrat  MD [phone]) on 02/04/2016 6:55:35 PM      MDM   Final diagnoses:  New onset a-fib ([place])   50 y.o. female presents to the ED in new onset a-fib with RVR. Began 45 minutes prior to arrival. Remainder of HPI, ROS, and Exam as above. She is hemodynamically stable. Appears very uncomfortable - HR anywhere from 150-180bpm.   Will cardiovert given recent known time of onset. Discussed with cardiology. Per Dr. Marlou Porch, Cardiology, start patient on Xarelto 20mg  daily for one month and have her follow up with the a-fib clinic in 1 week.   Versed given and the patient was cardioverted. See procedure note above.  Cardioversion was successful. Given her asymmetric leg swelling, a DVT study was obtained of the RLE -- negative. I do not suspect PE.   BNP and troponin are within normal limits. CBC and CMP are unremarkable, magnesium within normal limits. Chest x-ray shows no acute cardiopulmonary disease.  The patient was observed in the ED for a period of time, and she remained in normal sinus rhythm. Started on Xarelto per cardiology. First dose given in the ED.  Return precautions given. Follow up in the A. fib clinic within one week. Discharged home.  Case managed in conjunction with my attending, Dr. Vanita Panda.   Berenice Primas, MD 02/04/16 2325  Carmin Muskrat, MD 02/04/16 [phone]

## 2016-02-04 NOTE — ED Notes (Signed)
Decision for cardioversion made at this time. Pt placed on zoll. MD Vanita Panda explaining procedure to patient risks/benefits. Pt a/o x4. Verbalizes understanding.

## 2016-02-04 NOTE — Progress Notes (Signed)
VASCULAR LAB PRELIMINARY  PRELIMINARY  PRELIMINARY  PRELIMINARY  Right lower extremity venous duplex completed.    Preliminary report:  There is no DVT or SVT noted in the right lower extremity.   Esmeralda Blanford, RVT 02/04/2016, 7:22 PM

## 2016-02-08 ENCOUNTER — Encounter (HOSPITAL_COMMUNITY): Payer: Self-pay | Admitting: Nurse Practitioner

## 2016-02-08 ENCOUNTER — Ambulatory Visit (HOSPITAL_COMMUNITY)
Admission: RE | Admit: 2016-02-08 | Discharge: 2016-02-08 | Disposition: A | Discharge status: Home / Self Care | Payer: BLUE CROSS/BLUE SHIELD | Source: Ambulatory Visit | Attending: Nurse Practitioner | Admitting: Nurse Practitioner

## 2016-02-08 ENCOUNTER — Ambulatory Visit (HOSPITAL_BASED_OUTPATIENT_CLINIC_OR_DEPARTMENT_OTHER): Payer: BLUE CROSS/BLUE SHIELD

## 2016-02-08 ENCOUNTER — Encounter: Payer: Self-pay | Admitting: Obstetrics & Gynecology

## 2016-02-08 VITALS — BP 110/72 | HR 84 | Ht 67.0 in | Wt 245.2 lb

## 2016-02-08 DIAGNOSIS — I4891 Unspecified atrial fibrillation: Secondary | ICD-10-CM | POA: Diagnosis present

## 2016-02-08 DIAGNOSIS — Z79899 Other long term (current) drug therapy: Secondary | ICD-10-CM | POA: Diagnosis not present

## 2016-02-08 DIAGNOSIS — M1990 Unspecified osteoarthritis, unspecified site: Secondary | ICD-10-CM | POA: Insufficient documentation

## 2016-02-08 DIAGNOSIS — Z7901 Long term (current) use of anticoagulants: Secondary | ICD-10-CM | POA: Insufficient documentation

## 2016-02-08 DIAGNOSIS — Z8249 Family history of ischemic heart disease and other diseases of the circulatory system: Secondary | ICD-10-CM | POA: Insufficient documentation

## 2016-02-08 DIAGNOSIS — E669 Obesity, unspecified: Secondary | ICD-10-CM | POA: Diagnosis not present

## 2016-02-08 DIAGNOSIS — I480 Paroxysmal atrial fibrillation: Secondary | ICD-10-CM

## 2016-02-08 DIAGNOSIS — M797 Fibromyalgia: Secondary | ICD-10-CM | POA: Diagnosis not present

## 2016-02-08 MED ORDER — RIVAROXABAN 20 MG PO TABS: 20.0000 mg | ORAL_TABLET | Freq: Every day | ORAL | Status: DC

## 2016-02-08 NOTE — Progress Notes (Signed)
Patient ID: Alexandra Mata, female   DOB: 1966/01/30, 50 y.o.   MRN: [id number]     Primary Care Physician: Leeanne Rio, PA-C Referring Physician:[facility] ER f/u   Alexandra Mata is a 50 y.o. female with a h/o LLE, who presented to the ED due to sudden onset palpitations that began about 45 minutes prior to arrival while driving, associated with chest pain, shortness of breath. Drove herself to the ED where she was found to be in a-fib with RVR, 163 bpm.. No recent illness, nausea, vomiting, diarrhea, increased leg swelling. She was cardioverted to SR and d/c on xarelto 20 mg qd for a chadsvasc score of 1. No further issues with afib since discharged.  Lifestyle reviewed, no alcohol, tobacco, caffeine. She does snore with daytime somnolence. Has not had an echo.She has had more stress in her life than usual lately. No regular exercise, obese.  Today, she denies symptoms of palpitations, chest pain, shortness of breath, orthopnea, PND, lower extremity edema, dizziness, presyncope, syncope, or neurologic sequela. The patient is tolerating medications without difficulties and is otherwise without complaint today.   Past Medical History  Diagnosis Date  . History of chicken pox   . Asymptomatic varicose veins   . Obesity   . Edema   . Unspecified sinusitis (chronic)   . Anemia 05/17/2011  . Seizures ([place])     Pt states she had a couple of seizures in high school of undetermined cause-last 27 years ago--  . Pneumonia   . Osteoarthritis   . Fibromyalgia   . Palpitations     improved with cutting out caffeine   Past Surgical History  Procedure Laterality Date  . Tubal ligation  1991    `  . Ablation saphenous vein w/ rfa      3 yrs ago- bilateral    Current Outpatient Prescriptions  Medication Sig Dispense Refill  . DULoxetine (CYMBALTA) 60 MG capsule Take 1 capsule (60 mg total) by mouth daily. 30 capsule 3  . furosemide (LASIX) 20 MG tablet TAKE ONE TABLET BY MOUTH ONCE DAILY 30  tablet 3  . ibuprofen (ADVIL,MOTRIN) 200 MG tablet Take 200 mg by mouth every 6 (six) hours as needed for moderate pain.     Marland Kitchen loratadine-pseudoephedrine (CLARITIN-D 24 HOUR) 10-240 MG per 24 hr tablet Take 1 tablet by mouth daily. 60 tablet 0  . Multiple Vitamin (MULTIVITAMIN WITH MINERALS) TABS tablet Take 1 tablet by mouth daily.    Marland Kitchen OVER THE COUNTER MEDICATION Fish Oil 1000mg  with Omega-3 300mg -Take 1 capsule twice a day.    . rivaroxaban (XARELTO) 20 MG TABS tablet Take 1 tablet (20 mg total) by mouth daily with supper. 30 tablet 0  . traMADol (ULTRAM) 50 MG tablet TAKE ONE TABLET BY MOUTH EVERY 8 HOURS AS DIRECTED 30 tablet 0   No current facility-administered medications for this encounter.    No Known Allergies  Social History   Social History  . Marital Status: Married    Spouse Name: N/A  . Number of Children: 6  . Years of Education: N/A   Occupational History  . Not on file.   Social History Main Topics  . Smoking status: Never Smoker   . Smokeless tobacco: Never Used  . Alcohol Use: No  . Drug Use: No  . Sexual Activity: Yes   Other Topics Concern  . Not on file   Social History Narrative   Regular exercise:  No   Caffeine Use:  No  3 biological children, 3 [place]- two children   [place]- Daughter Larene [place] (three children)      [place]- denies   Never smoked.   Denies drug use      [employer].      Married to [employer]    Family History  Problem Relation Age of Onset  . Arthritis Mother   . Hypertension Mother   . Diabetes Mother   . Asthma Mother   . Hypertension Father   . Diabetes Maternal Grandmother   . Hypertension Maternal Grandmother   . Heart disease Maternal Grandmother   . Diabetes Paternal Grandmother   . Cancer Paternal Grandfather     lung  . Coronary artery disease Brother 61    MI  . Colon cancer Neg Hx   . Colon polyps Neg Hx   . Thyroid  cancer Father     ROS- All systems are reviewed and negative except as per the HPI above  Physical Exam: Filed Vitals:   02/08/16 1418  BP: 110/72  Pulse: 84  Height: 5\' 7"  (1.702 m)  Weight: 245 lb 3.2 oz (111.222 kg)    GEN- The patient is well appearing, alert and oriented x 3 today.   Head- normocephalic, atraumatic Eyes-  Sclera clear, conjunctiva pink Ears- hearing intact Oropharynx- clear Neck- supple, no JVP Lymph- no cervical lymphadenopathy Lungs- Clear to ausculation bilaterally, normal work of breathing Heart- Regular rate and rhythm, no murmurs, rubs or gallops, PMI not laterally displaced GI- soft, NT, ND, + BS Extremities- no clubbing, cyanosis, or edema MS- no significant deformity or atrophy Skin- no rash or lesion Psych- euthymic mood, full affect Neuro- strength and sensation are intact  EKG-NSR at 84 bpm, Pr int 120 ms, qrs int 84 ms, qtc 437 ms Epic records reviewed    Assessment and Plan: 1. New onset afib Successful cardioversion Continue xarelto x one month f/u cardioversion and then can stop with chadsvasc score of 1 for female Echo  Sleep study  Encouraged regular exercise and weight loss  F/u in 8 weeks, sooner if needed  Alexandra Mata, [facility] [address] [place],  [id number] [phone]

## 2016-02-08 NOTE — Patient Instructions (Addendum)
Scheduler will be in touch with you

## 2016-02-09 ENCOUNTER — Other Ambulatory Visit: Payer: Self-pay | Admitting: *Deleted

## 2016-02-09 DIAGNOSIS — G4733 Obstructive sleep apnea (adult) (pediatric): Secondary | ICD-10-CM

## 2016-02-12 ENCOUNTER — Ambulatory Visit (HOSPITAL_BASED_OUTPATIENT_CLINIC_OR_DEPARTMENT_OTHER): Payer: BLUE CROSS/BLUE SHIELD

## 2016-02-15 ENCOUNTER — Ambulatory Visit (HOSPITAL_COMMUNITY)
Admission: RE | Admit: 2016-02-15 | Discharge: 2016-02-15 | Disposition: A | Discharge status: Home / Self Care | Payer: BLUE CROSS/BLUE SHIELD | Source: Ambulatory Visit | Attending: Nurse Practitioner | Admitting: Nurse Practitioner

## 2016-02-15 DIAGNOSIS — I4891 Unspecified atrial fibrillation: Secondary | ICD-10-CM | POA: Diagnosis present

## 2016-02-15 DIAGNOSIS — I480 Paroxysmal atrial fibrillation: Secondary | ICD-10-CM | POA: Diagnosis not present

## 2016-02-15 DIAGNOSIS — E669 Obesity, unspecified: Secondary | ICD-10-CM | POA: Insufficient documentation

## 2016-02-15 DIAGNOSIS — Z6838 Body mass index (BMI) 38.0-38.9, adult: Secondary | ICD-10-CM | POA: Diagnosis not present

## 2016-02-15 LAB — ECHOCARDIOGRAM COMPLETE
E decel time: 250 msec
EERAT: 5.36
FS: 28 % (ref 28–44)
IV/PV OW: 1.12
LA diam end sys: 37 mm
LA diam index: 1.67 cm/m2
LA vol A4C: 53.4 ml
LASIZE: 37 mm
LAVOL: 76.4 mL
LAVOLIN: 34.6 mL/m2
LDCA: 3.46 cm2
LV E/e' medial: 5.36
LV PW d: 11.1 mm — AB (ref 0.6–1.1)
LV TDI E'LATERAL: 11.3
LV TDI E'MEDIAL: 8.27
LV e' LATERAL: 11.3 cm/s
LVEEAVG: 5.36
LVOT VTI: 17.7 cm
LVOT peak vel: 89.4 cm/s
LVOTD: 21 mm
LVOTSV: 61 mL
MV Dec: 250
MV pk A vel: 58.2 m/s
MVPKEVEL: 60.6 m/s

## 2016-02-15 NOTE — Progress Notes (Signed)
Echocardiogram 2D Echocardiogram has been performed.  Alexandra Mata 02/15/2016, 4:03 PM

## 2016-03-30 ENCOUNTER — Ambulatory Visit (HOSPITAL_BASED_OUTPATIENT_CLINIC_OR_DEPARTMENT_OTHER): Payer: BLUE CROSS/BLUE SHIELD | Attending: Nurse Practitioner | Admitting: Cardiology

## 2016-03-30 VITALS — Ht 67.0 in | Wt 240.0 lb

## 2016-03-30 DIAGNOSIS — I4891 Unspecified atrial fibrillation: Secondary | ICD-10-CM

## 2016-03-30 DIAGNOSIS — R5383 Other fatigue: Secondary | ICD-10-CM | POA: Insufficient documentation

## 2016-03-30 DIAGNOSIS — I480 Paroxysmal atrial fibrillation: Secondary | ICD-10-CM | POA: Insufficient documentation

## 2016-03-30 DIAGNOSIS — R0683 Snoring: Secondary | ICD-10-CM | POA: Diagnosis not present

## 2016-03-30 DIAGNOSIS — G4719 Other hypersomnia: Secondary | ICD-10-CM | POA: Insufficient documentation

## 2016-03-30 DIAGNOSIS — G4733 Obstructive sleep apnea (adult) (pediatric): Secondary | ICD-10-CM | POA: Diagnosis not present

## 2016-04-01 NOTE — Procedures (Signed)
   Patient Name: Alexandra Mata, Alexandra Mata Date: 03/30/2016 Gender: Female D.O.B: July 08, 1966 Age (years): 12 Referring Provider: Sherran Needs Height (inches): 78 Interpreting Physician: Fransico Him MD, ABSM Weight (lbs): 240 RPSGT: Carolin Coy BMI: 38 MRN: [id number] Neck Size: 14.00  CLINICAL INFORMATION Sleep Study Type: NPSG Indication for sleep study: Excessive Daytime Sleepiness, Fatigue, OSA Epworth Sleepiness Score: 20  SLEEP STUDY TECHNIQUE As per the AASM Manual for the Scoring of Sleep and Associated Events v2.3 (April 2016) with a hypopnea requiring 4% desaturations. The channels recorded and monitored were frontal, central and occipital EEG, electrooculogram (EOG), submentalis EMG (chin), nasal and oral airflow, thoracic and abdominal wall motion, anterior tibialis EMG, snore microphone, electrocardiogram, and pulse oximetry.  MEDICATIONS Patient's medications include: Reviewed in the chart. Medications self-administered by patient during sleep study : No sleep medicine administered.  SLEEP ARCHITECTURE The study was initiated at 10:16:06 PM and ended at 4:30:09 AM. Sleep onset time was 2.1 minutes and the sleep efficiency was 95.6%. The total sleep time was 357.5 minutes. Stage REM latency was markedly prolonged at 281.0 minutes. The patient spent 7.41% of the night in stage N1 sleep, 80.14% in stage N2 sleep, 0.84% in stage N3 and 11.61% in REM. Alpha intrusion was absent. Supine sleep was 92.31%.  RESPIRATORY PARAMETERS The overall apnea/hypopnea index (AHI) was 6.4 per hour. There were 7 total apneas, including 5 obstructive, 2 central and 0 mixed apneas. There were 31 hypopneas and 41 RERAs. The AHI during Stage REM sleep was 30.4 per hour. AHI while supine was 6.9 per hour. The mean oxygen saturation was 93.29%. The minimum SpO2 during sleep was 87.00%. Soft snoring was noted during this study.  CARDIAC DATA The 2 lead EKG demonstrated sinus rhythm. The  mean heart rate was 79.02 beats per minute. Other EKG findings include: None.  LEG MOVEMENT DATA The total PLMS were 0 with a resulting PLMS index of 0.00. Associated arousal with leg movement index was 0.0 .  IMPRESSIONS - Mild obstructive sleep apnea occurred during this study (AHI = 6.4/h) overall but severe during REM sleep (30.4/hr). - No significant central sleep apnea occurred during this study (CAI = 0.3/h). - Mild oxygen desaturation was noted during this study (Min O2 = 87.00%). - The patient snored with Soft snoring volume. - No cardiac abnormalities were noted during this study. - Clinically significant periodic limb movements did not occur during sleep. No significant associated arousals.  DIAGNOSIS - Obstructive Sleep Apnea (327.23 [G47.33 ICD-10])  RECOMMENDATIONS - Positional therapy avoiding supine position during sleep. - Patient has mild OSA overall but severe during REM sleep and significant excessive daytime sleepiness with Epworth Sleepiness score of 20.  Given patient's history of PAF, recommend proceeding with CPAP titration.  - Avoid alcohol, sedatives and other CNS depressants that may worsen sleep apnea and disrupt normal sleep architecture. - Sleep hygiene should be reviewed to assess factors that may improve sleep quality. - Weight management and regular exercise should be initiated or continued if appropriate.   [place], American Board of Sleep Medicine  ELECTRONICALLY SIGNED ON:  04/01/2016, 10:43 PM [place] PH: [phone]   FX: [phone] [place]

## 2016-04-04 ENCOUNTER — Encounter (HOSPITAL_COMMUNITY): Payer: Self-pay | Admitting: Nurse Practitioner

## 2016-04-04 ENCOUNTER — Ambulatory Visit (INDEPENDENT_AMBULATORY_CARE_PROVIDER_SITE_OTHER): Payer: BLUE CROSS/BLUE SHIELD | Admitting: Physician Assistant

## 2016-04-04 ENCOUNTER — Other Ambulatory Visit: Payer: Self-pay | Admitting: Physician Assistant

## 2016-04-04 ENCOUNTER — Encounter: Payer: Self-pay | Admitting: Physician Assistant

## 2016-04-04 ENCOUNTER — Ambulatory Visit (HOSPITAL_COMMUNITY)
Admission: RE | Admit: 2016-04-04 | Discharge: 2016-04-04 | Disposition: A | Discharge status: Home / Self Care | Payer: BLUE CROSS/BLUE SHIELD | Source: Ambulatory Visit | Attending: Nurse Practitioner | Admitting: Nurse Practitioner

## 2016-04-04 VITALS — BP 136/75 | HR 78 | Temp 97.5°F | Resp 16 | Ht 67.0 in | Wt 243.2 lb

## 2016-04-04 VITALS — BP 118/80 | Ht 67.0 in | Wt 246.0 lb

## 2016-04-04 DIAGNOSIS — Z6838 Body mass index (BMI) 38.0-38.9, adult: Secondary | ICD-10-CM | POA: Insufficient documentation

## 2016-04-04 DIAGNOSIS — Z808 Family history of malignant neoplasm of other organs or systems: Secondary | ICD-10-CM | POA: Insufficient documentation

## 2016-04-04 DIAGNOSIS — Z79899 Other long term (current) drug therapy: Secondary | ICD-10-CM | POA: Insufficient documentation

## 2016-04-04 DIAGNOSIS — M1990 Unspecified osteoarthritis, unspecified site: Secondary | ICD-10-CM | POA: Diagnosis not present

## 2016-04-04 DIAGNOSIS — I480 Paroxysmal atrial fibrillation: Secondary | ICD-10-CM

## 2016-04-04 DIAGNOSIS — M797 Fibromyalgia: Secondary | ICD-10-CM | POA: Diagnosis not present

## 2016-04-04 DIAGNOSIS — H029 Unspecified disorder of eyelid: Secondary | ICD-10-CM

## 2016-04-04 DIAGNOSIS — Z825 Family history of asthma and other chronic lower respiratory diseases: Secondary | ICD-10-CM | POA: Insufficient documentation

## 2016-04-04 DIAGNOSIS — Z8249 Family history of ischemic heart disease and other diseases of the circulatory system: Secondary | ICD-10-CM | POA: Diagnosis not present

## 2016-04-04 DIAGNOSIS — E669 Obesity, unspecified: Secondary | ICD-10-CM | POA: Insufficient documentation

## 2016-04-04 DIAGNOSIS — Z833 Family history of diabetes mellitus: Secondary | ICD-10-CM | POA: Diagnosis not present

## 2016-04-04 DIAGNOSIS — I4891 Unspecified atrial fibrillation: Secondary | ICD-10-CM | POA: Insufficient documentation

## 2016-04-04 MED ORDER — DILTIAZEM HCL 30 MG PO TABS
ORAL_TABLET | ORAL | 2 refills | Status: DC
Start: 2016-04-04 — End: 2016-07-06

## 2016-04-04 MED ORDER — ERYTHROMYCIN 5 MG/GM OP OINT: TOPICAL_OINTMENT | OPHTHALMIC | 0 refills | Status: DC

## 2016-04-04 NOTE — Progress Notes (Signed)
Patient ID: [name], female   DOB: Dec 04, 1965, 50 y.o.   MRN: [id number]     Primary Care Physician: Leeanne Rio, PA-C Referring Physician:[facility] ER f/u   Alexandra Mata is a 50 y.o. female with a h/o LLE, who presented to the ED due to sudden onset palpitations that began about 45 minutes prior to arrival while driving, associated with chest pain, shortness of breath. Drove herself to the ED where she was found to be in a-fib with RVR, 163 bpm. No recent illness, nausea, vomiting, diarrhea, increased leg swelling. She was cardioverted to SR and d/c on xarelto 20 mg qd for a chadsvasc score of 1. No further issues with afib since discharged.  Lifestyle reviewed, no alcohol, tobacco, caffeine. She does snore with daytime somnolence. Has not had an echo.She has had more stress in her life than usual lately. No regular exercise, obese.  F/u in afib clinic 8/17, She has completed 30 days of xarelto and is now off drug with a chadsvasc score of 1(female). She has noted HR racing up to 140 x 2 but for less than a minute each time. She did have a positive sleep study and is pending cpap trial. Echo showed normal EF and normal left atrial size.  Today, she denies symptoms of palpitations, chest pain, shortness of breath, orthopnea, PND, lower extremity edema, dizziness, presyncope, syncope, or neurologic sequela. The patient is tolerating medications without difficulties and is otherwise without complaint today.   Past Medical History:  Diagnosis Date  . Anemia 05/17/2011  . Asymptomatic varicose veins   . Edema   . Fibromyalgia   . History of chicken pox   . Obesity   . Osteoarthritis   . Palpitations    improved with cutting out caffeine  . Pneumonia   . Seizures ([place])    Pt states she had a couple of seizures in high school of undetermined cause-last 27 years ago--  . Unspecified sinusitis (chronic)    Past Surgical History:  Procedure Laterality Date  . ABLATION SAPHENOUS VEIN  W/ RFA     3 yrs ago- bilateral  . TUBAL LIGATION  1991   `    Current Outpatient Prescriptions  Medication Sig Dispense Refill  . DULoxetine (CYMBALTA) 60 MG capsule Take 1 capsule (60 mg total) by mouth daily. 30 capsule 3  . erythromycin ophthalmic ointment Apply 1 application to the left eyelid twice daily for 5-7 days 3.5 g 0  . furosemide (LASIX) 20 MG tablet TAKE ONE TABLET BY MOUTH ONCE DAILY 30 tablet 3  . ibuprofen (ADVIL,MOTRIN) 200 MG tablet Take 200 mg by mouth every 6 (six) hours as needed for moderate pain.     Marland Kitchen loratadine-pseudoephedrine (CLARITIN-D 24 HOUR) 10-240 MG per 24 hr tablet Take 1 tablet by mouth daily. 60 tablet 0  . Multiple Vitamin (MULTIVITAMIN WITH MINERALS) TABS tablet Take 1 tablet by mouth daily.    Marland Kitchen OVER THE COUNTER MEDICATION Fish Oil 1000mg  with Omega-3 300mg -Take 1 capsule twice a day.    . traMADol (ULTRAM) 50 MG tablet TAKE ONE TABLET BY MOUTH EVERY 8 HOURS AS DIRECTED 30 tablet 0  . diltiazem (CARDIZEM) 30 MG tablet Take one tablet by mouth every 4 hours AS NEEDED for A-fib HR > 100 as long as BP > 100. 30 tablet 2   No current facility-administered medications for this encounter.     Allergies  Allergen Reactions  . Adhesive [Tape] Swelling and Rash  Blisters, swelling & burn of skin [Teflon tape at Sleep Study]    Social History   Social History  . Marital status: Married    Spouse name: N/A  . Number of children: 6  . Years of education: N/A   Occupational History  . Not on file.   Social History Main Topics  . Smoking status: Never Smoker  . Smokeless tobacco: Never Used  . Alcohol use No  . Drug use: No  . Sexual activity: Yes   Other Topics Concern  . Not on file   Social History Narrative   Regular exercise:  No   Caffeine Use:  No   3 biological children, 3 [place]- two children   [place]- Daughter Larene Beach (three children)      Etoh- denies   Never smoked.   Denies drug  use      [employer] [place] [employer].      Married to [employer]    Family History  Problem Relation Age of Onset  . Arthritis Mother   . Hypertension Mother   . Diabetes Mother   . Asthma Mother   . Hypertension Father   . Diabetes Maternal Grandmother   . Hypertension Maternal Grandmother   . Heart disease Maternal Grandmother   . Diabetes Paternal Grandmother   . Cancer Paternal Grandfather     lung  . Coronary artery disease Brother 17    MI  . Colon cancer Neg Hx   . Colon polyps Neg Hx   . Thyroid cancer Father     ROS- All systems are reviewed and negative except as per the HPI above  Physical Exam: Vitals:   04/04/16 1538  BP: 118/80  Weight: 246 lb (111.6 kg)  Height: 5\' 7"  (1.702 m)    GEN- The patient is well appearing, alert and oriented x 3 today.   Head- normocephalic, atraumatic Eyes-  Sclera clear, conjunctiva pink Ears- hearing intact Oropharynx- clear Neck- supple, no JVP Lymph- no cervical lymphadenopathy Lungs- Clear to ausculation bilaterally, normal work of breathing Heart- Regular rate and rhythm, no murmurs, rubs or gallops, PMI not laterally displaced GI- soft, NT, ND, + BS Extremities- no clubbing, cyanosis, or edema MS- no significant deformity or atrophy Skin- no rash or lesion Psych- euthymic mood, full affect Neuro- strength and sensation are intact  EKG-NSR at 83 bpm, Pr int 126 ms, qrs int 102 ms, qtc 462 ms Epic records reviewed    Assessment and Plan: 1. New onset afib Successful cardioversion Now off xarelto, taken x one month f/u cardioversion, can stop with chadsvasc score of 1 for female Echo reviewed Sleep study reviewed and is pending cpap titration trial Encouraged regular exercise and weight loss Cardizem 30 mg given to take one every 4-6 hours as needed for fast irregular heart rate  F/u  afib clinic as needed Per Dr. Radford Pax when established on cpap  Geroge Baseman. Willow Shidler,  [facility] [address] [place], [place] [address] [phone]

## 2016-04-04 NOTE — Patient Instructions (Signed)
Please avoid touching the area as this causes irritation and increases risk of infection.  You will be contacted by an Ophthalmologist for further assessment and removal of the lesion.   Please apply the antibiotic ointment as directed. If you wear contacts, where glasses instead until this is resolved.

## 2016-04-04 NOTE — Progress Notes (Signed)
Patient presents to clinic today c/o growth of left upper eyelid first noted 2 months ago. Endorses . Has stayed roughly the same size since onset. Is tender to the touch. Denies vision changes, drainage from the lesion, or similar lesions noted elsewhere.   Past Medical History:  Diagnosis Date  . Anemia 05/17/2011  . Asymptomatic varicose veins   . Edema   . Fibromyalgia   . History of chicken pox   . Obesity   . Osteoarthritis   . Palpitations    improved with cutting out caffeine  . Pneumonia   . Seizures ([place])    Pt states she had a couple of seizures in high school of undetermined cause-last 27 years ago--  . Unspecified sinusitis (chronic)     Current Outpatient Prescriptions on File Prior to Visit  Medication Sig Dispense Refill  . DULoxetine (CYMBALTA) 60 MG capsule Take 1 capsule (60 mg total) by mouth daily. 30 capsule 3  . furosemide (LASIX) 20 MG tablet TAKE ONE TABLET BY MOUTH ONCE DAILY 30 tablet 3  . ibuprofen (ADVIL,MOTRIN) 200 MG tablet Take 200 mg by mouth every 6 (six) hours as needed for moderate pain.     Marland Kitchen loratadine-pseudoephedrine (CLARITIN-D 24 HOUR) 10-240 MG per 24 hr tablet Take 1 tablet by mouth daily. 60 tablet 0  . Multiple Vitamin (MULTIVITAMIN WITH MINERALS) TABS tablet Take 1 tablet by mouth daily.    Marland Kitchen OVER THE COUNTER MEDICATION Fish Oil '1000mg'$  with Omega-3 '300mg'$ -Take 1 capsule twice a day.    . traMADol (ULTRAM) 50 MG tablet TAKE ONE TABLET BY MOUTH EVERY 8 HOURS AS DIRECTED 30 tablet 0   No current facility-administered medications on file prior to visit.     Allergies  Allergen Reactions  . Adhesive [Tape] Rash    Blisters, swelling & burn of skin [Teflon tape at Sleep Study]    Family History  Problem Relation Age of Onset  . Arthritis Mother   . Hypertension Mother   . Diabetes Mother   . Asthma Mother   . Hypertension Father   . Diabetes Maternal Grandmother   . Hypertension Maternal Grandmother   . Heart disease Maternal  Grandmother   . Diabetes Paternal Grandmother   . Cancer Paternal Grandfather     lung  . Coronary artery disease Brother 37    MI  . Colon cancer Neg Hx   . Colon polyps Neg Hx   . Thyroid cancer Father     Social History   Social History  . Marital status: Married    Spouse name: N/A  . Number of children: 6  . Years of education: N/A   Social History Main Topics  . Smoking status: Never Smoker  . Smokeless tobacco: Never Used  . Alcohol use No  . Drug use: No  . Sexual activity: Yes   Other Topics Concern  . Not on file   Social History Narrative   Regular exercise:  No   Caffeine Use:  No   3 biological children, 3 [place]- two children   [place]- Daughter Larene [place] (three children)      Etoh- denies   Never smoked.   Denies drug use      [place] [employer].      Married to [employer]   Review of Systems - See HPI.  All other ROS are negative.  LMP 04/15/2013   Physical  Exam  Constitutional: She is oriented to person, place, and time and well-developed, well-nourished, and in no distress.  HENT:  Head: Normocephalic and atraumatic.  Eyes:    Neck: Neck supple.  Cardiovascular: Normal rate and regular rhythm.   Pulmonary/Chest: Effort normal.  Neurological: She is alert and oriented to person, place, and time.  Skin: Skin is warm and dry. No rash noted.  Psychiatric: Affect normal.  Vitals reviewed.   Recent Results (from the past 2160 hour(s))  CBC with Differential     Status: Abnormal   Collection Time: 02/04/16  6:04 PM  Result Value Ref Range   WBC 13.3 (H) 4.0 - 10.5 K/uL   RBC 4.80 3.87 - 5.11 MIL/uL   Hemoglobin 13.3 12.0 - 15.0 g/dL   HCT 42.1 36.0 - 46.0 %   MCV 87.7 78.0 - 100.0 fL   MCH 27.7 26.0 - 34.0 pg   MCHC 31.6 30.0 - 36.0 g/dL   RDW 13.6 11.5 - 15.5 %   Platelets 283 150 - 400 K/uL   Neutrophils Relative % 65 %   Neutro Abs 8.6 (H) 1.7 - 7.7  K/uL   Lymphocytes Relative 27 %   Lymphs Abs 3.5 0.7 - 4.0 K/uL   Monocytes Relative 7 %   Monocytes Absolute 1.0 0.1 - 1.0 K/uL   Eosinophils Relative 1 %   Eosinophils Absolute 0.2 0.0 - 0.7 K/uL   Basophils Relative 0 %   Basophils Absolute 0.0 0.0 - 0.1 K/uL  Comprehensive metabolic panel     Status: Abnormal   Collection Time: 02/04/16  6:04 PM  Result Value Ref Range   Sodium 136 135 - 145 mmol/L   Potassium 4.3 3.5 - 5.1 mmol/L   Chloride 102 101 - 111 mmol/L   CO2 25 22 - 32 mmol/L   Glucose, Bld 93 65 - 99 mg/dL   BUN 20 6 - 20 mg/dL   Creatinine, Ser 0.73 0.44 - 1.00 mg/dL   Calcium 10.0 8.9 - 10.3 mg/dL   Total Protein 7.8 6.5 - 8.1 g/dL   Albumin 4.1 3.5 - 5.0 g/dL   AST 20 15 - 41 U/L   ALT 20 14 - 54 U/L   Alkaline Phosphatase 87 38 - 126 U/L   Total Bilirubin 0.1 (L) 0.3 - 1.2 mg/dL   GFR calc non Af Amer >60 >60 mL/min   GFR calc Af Amer >60 >60 mL/min    Comment: (NOTE) The eGFR has been calculated using the CKD EPI equation. This calculation has not been validated in all clinical situations. eGFR's persistently <60 mL/min signify possible Chronic Kidney Disease.    Anion gap 9 5 - 15  Magnesium     Status: None   Collection Time: 02/04/16  6:04 PM  Result Value Ref Range   Magnesium 1.8 1.7 - 2.4 mg/dL  I-Stat Troponin, ED (not at [facility])     Status: None   Collection Time: 02/04/16  6:26 PM  Result Value Ref Range   Troponin i, poc 0.01 0.00 - 0.08 ng/mL   Comment 3            Comment: Due to the release kinetics of cTnI, a negative result within the first hours of the onset of symptoms does not rule out myocardial infarction with certainty. If myocardial infarction is still suspected, repeat the test at appropriate intervals.   Brain natriuretic peptide     Status: None   Collection Time: 02/04/16  6:28 PM  Result Value Ref Range   B Natriuretic Peptide 48.3 0.0 - 100.0 pg/mL  ECHOCARDIOGRAM COMPLETE     Status: Abnormal   Collection Time:  02/15/16  4:03 PM  Result Value Ref Range   LV PW d 11.1 (A) 0.6 - 1.1 mm   FS 28 28 - 44 %   LA vol 76.4 mL   LA ID, A-P, ES 37 mm   IVS/LV PW RATIO, ED 1.12    LVOT VTI 17.7 cm   LV e' LATERAL 11.3 cm/s   LV E/e' medial 5.36    LV E/e'average 5.36    LA diam index 1.67 cm/m2   LA vol A4C 53.4 ml   E decel time 250 msec   LVOT diameter 21 mm   LVOT area 3.46 cm2   LVOT peak vel 89.4 cm/s   LVOT SV 61 mL   E/e' ratio 5.36    MV pk E vel 60.6 m/s   MV pk A vel 58.2 m/s   LA vol index 34.6 mL/m2   MV Dec 250    LA diam end sys 37 mm   TDI e' medial 8.27    TDI e' lateral 11.3     Assessment/Plan: 1. Lesion of eyelid Infected cyst/milia versus smooth wart. Signs of mild infection. Romycin started. Referral to ophthalmology Place for further assessment and removal.  - Ambulatory referral to Ophthalmology - erythromycin ophthalmic ointment; Apply 1 application to the left eyelid twice daily for 5-7 days  Dispense: 3.5 g; Refill: 0   Leeanne Rio, PA-C

## 2016-04-04 NOTE — Patient Instructions (Signed)
Your physician has recommended you make the following change in your medication: START Cardizem AS NEEDED.  Take one tablet by mouth every 4 hours AS NEEDED for A-fib HR > 100 as long as BP > 100.

## 2016-04-06 ENCOUNTER — Telehealth: Payer: Self-pay

## 2016-04-06 ENCOUNTER — Other Ambulatory Visit: Payer: Self-pay

## 2016-04-06 DIAGNOSIS — G4733 Obstructive sleep apnea (adult) (pediatric): Secondary | ICD-10-CM

## 2016-04-06 NOTE — Telephone Encounter (Signed)
Spoke with patient and gave sleep study results. Patient in agreement with treatment plan. Will schedule titration and mail letter

## 2016-04-07 ENCOUNTER — Encounter: Payer: Self-pay | Admitting: Obstetrics & Gynecology

## 2016-04-08 ENCOUNTER — Other Ambulatory Visit: Payer: Self-pay | Admitting: *Deleted

## 2016-04-08 DIAGNOSIS — R102 Pelvic and perineal pain: Secondary | ICD-10-CM

## 2016-04-10 ENCOUNTER — Ambulatory Visit (HOSPITAL_BASED_OUTPATIENT_CLINIC_OR_DEPARTMENT_OTHER)
Admission: RE | Admit: 2016-04-10 | Discharge: 2016-04-10 | Disposition: A | Discharge status: Home / Self Care | Payer: BLUE CROSS/BLUE SHIELD | Source: Ambulatory Visit | Attending: Obstetrics & Gynecology | Admitting: Obstetrics & Gynecology

## 2016-04-10 ENCOUNTER — Ambulatory Visit (HOSPITAL_BASED_OUTPATIENT_CLINIC_OR_DEPARTMENT_OTHER): Admission: RE | Admit: 2016-04-10 | Payer: BLUE CROSS/BLUE SHIELD | Source: Ambulatory Visit

## 2016-04-10 DIAGNOSIS — R938 Abnormal findings on diagnostic imaging of other specified body structures: Secondary | ICD-10-CM | POA: Diagnosis not present

## 2016-04-10 DIAGNOSIS — D259 Leiomyoma of uterus, unspecified: Secondary | ICD-10-CM | POA: Insufficient documentation

## 2016-04-10 DIAGNOSIS — R102 Pelvic and perineal pain: Secondary | ICD-10-CM | POA: Insufficient documentation

## 2016-04-29 ENCOUNTER — Ambulatory Visit: Payer: BLUE CROSS/BLUE SHIELD | Admitting: Obstetrics and Gynecology

## 2016-05-10 ENCOUNTER — Encounter: Payer: Self-pay | Admitting: Obstetrics & Gynecology

## 2016-05-10 ENCOUNTER — Ambulatory Visit (INDEPENDENT_AMBULATORY_CARE_PROVIDER_SITE_OTHER): Payer: BLUE CROSS/BLUE SHIELD | Admitting: Obstetrics & Gynecology

## 2016-05-10 VITALS — BP 128/84 | HR 86 | Ht 67.0 in | Wt 246.0 lb

## 2016-05-10 DIAGNOSIS — R102 Pelvic and perineal pain: Secondary | ICD-10-CM

## 2016-05-10 DIAGNOSIS — Z23 Encounter for immunization: Secondary | ICD-10-CM | POA: Diagnosis not present

## 2016-05-10 DIAGNOSIS — N83201 Unspecified ovarian cyst, right side: Secondary | ICD-10-CM | POA: Diagnosis not present

## 2016-05-10 DIAGNOSIS — D251 Intramural leiomyoma of uterus: Secondary | ICD-10-CM | POA: Diagnosis not present

## 2016-05-10 DIAGNOSIS — Z0000 Encounter for general adult medical examination without abnormal findings: Secondary | ICD-10-CM

## 2016-05-10 MED ORDER — AMOXICILLIN-POT CLAVULANATE 875-125 MG PO TABS: 1.0000 | ORAL_TABLET | Freq: Two times a day (BID) | ORAL | 1 refills | Status: DC

## 2016-05-10 NOTE — Progress Notes (Signed)
   Subjective:    Patient ID: Alexandra Mata, female    DOB: 19-Aug-1965, 50 y.o.   MRN: [id number]  HPI 50 yo MWP3 here because she is tired of her pelvic pain. We discover a 6.7 cm fibroid and a simple right ovarian cyst on u/s about 6 months ago. Pain is unchanged. "almost like menstrual cramping" daily. LMP about 3 years ago. + hot flashes. Uses lube with sex. Occasional mood swings since menopause. Using cymbalta, but no estrogen  She has a sinus infection and would like an abx.  Review of Systems FH negative for breast/gyn/colon cancer She is a Radio broadcast assistant Some "occasional" sex as husband is DM and has ED. She denies dyspareunia.    Objective:   Physical Exam [id number] Breathing, conversing, and ambulating normally Abd- benign, obese Excellent pelvic bones for vaginal removal of uterus      Assessment & Plan:  Pelvic pain/fibroid/ovarian cyst Check CA 125 Schedule LAVH/BSO She saw her cards 7/17 and not taking any meds on a daily basis, only cardizem prn (has used this 5 times in the last month) She will need a pre op visit with anesthesia Sinus infection- augmentin Flu vaccine today Mammogram ordered

## 2016-05-12 ENCOUNTER — Other Ambulatory Visit: Payer: Self-pay | Admitting: Physician Assistant

## 2016-05-13 ENCOUNTER — Encounter (HOSPITAL_COMMUNITY): Payer: Self-pay | Admitting: *Deleted

## 2016-05-13 MED ORDER — DULOXETINE HCL 60 MG PO CPEP: 60.0000 mg | ORAL_CAPSULE | Freq: Every day | ORAL | 0 refills | Status: DC

## 2016-05-13 MED ORDER — TRAMADOL HCL 50 MG PO TABS: 50.0000 mg | ORAL_TABLET | Freq: Four times a day (QID) | ORAL | 0 refills | Status: DC | PRN

## 2016-05-19 ENCOUNTER — Ambulatory Visit (HOSPITAL_BASED_OUTPATIENT_CLINIC_OR_DEPARTMENT_OTHER): Payer: BLUE CROSS/BLUE SHIELD | Attending: Cardiology | Admitting: Cardiology

## 2016-05-19 DIAGNOSIS — G4733 Obstructive sleep apnea (adult) (pediatric): Secondary | ICD-10-CM

## 2016-05-25 NOTE — Procedures (Signed)
   Patient Name: Alexandra Mata, Alexandra Mata Date: 05/19/2016 Gender: Female D.O.B: 07-Aug-1966 Age (years): 54 Referring Provider: Sherran Needs Height (inches): 16 Interpreting Physician: Fransico Him MD, ABSM Weight (lbs): 240 RPSGT: Laren Everts BMI: 38 MRN: [id number] Neck Size: 14.00  CLINICAL INFORMATION The patient is referred for a CPAP titration to treat sleep apnea. Date of NPSG, Split Night or HST:03/30/2016  SLEEP STUDY TECHNIQUE As per the AASM Manual for the Scoring of Sleep and Associated Events v2.3 (April 2016) with a hypopnea requiring 4% desaturations. The channels recorded and monitored were frontal, central and occipital EEG, electrooculogram (EOG), submentalis EMG (chin), nasal and oral airflow, thoracic and abdominal wall motion, anterior tibialis EMG, snore microphone, electrocardiogram, and pulse oximetry. Continuous positive airway pressure (CPAP) was initiated at the beginning of the study and titrated to treat sleep-disordered breathing.  MEDICATIONS Medications self-administered by patient taken the night of the study : N/A  TECHNICIAN COMMENTS Comments added by technician: NONE  Comments added by scorer: N/A  RESPIRATORY PARAMETERS Optimal PAP Pressure (cm): 6  AHI at Optimal Pressure (/hr):0.0 Overall Minimal O2 (%):89.00  Supine % at Optimal Pressure (%):90 Minimal O2 at Optimal Pressure (%): 89.0    SLEEP ARCHITECTURE The study was initiated at 10:40:10 PM and ended at 4:57:23 AM. Sleep onset time was 23.7 minutes and the sleep efficiency was 85.9%. The total sleep time was 324.0 minutes. The patient spent 10.96% of the night in stage N1 sleep, 76.54% in stage N2 sleep, 0.15% in stage N3 and 12.35% in REM.Stage REM latency was 122.5 minutes Wake after sleep onset was 29.5. Alpha intrusion was absent. Supine sleep was 83.02%.  CARDIAC DATA The 2 lead EKG demonstrated sinus rhythm. The mean heart rate was 84.62 beats per minute. Other EKG  findings include: None.  LEG MOVEMENT DATA The total Periodic Limb Movements of Sleep (PLMS) were 14. The PLMS index was 2.59. A PLMS index of <15 is considered normal in adults.  IMPRESSIONS - The optimal PAP pressure was 6 cm of water. - Central sleep apnea was not noted during this titration (CAI = 0.0/h). - Mild oxygen desaturations were observed during this titration (min O2 = 89.00%). - No snoring was audible during this study. - No cardiac abnormalities were observed during this study. - Clinically significant periodic limb movements were not noted during this study. Arousals associated with PLMs were rare.  DIAGNOSIS - Obstructive Sleep Apnea (327.23 [G47.33 ICD-10])  RECOMMENDATIONS - Trial of CPAP therapy on 6 cm H2O with a Small size Resmed Full Face Mask AirFit F20 mask and heated humidification. - Avoid alcohol, sedatives and other CNS depressants that may worsen sleep apnea and disrupt normal sleep architecture. - Sleep hygiene should be reviewed to assess factors that may improve sleep quality. - Weight management and regular exercise should be initiated or continued. - Return to Sleep Center for re-evaluation after 10 weeks of therapy   [place], [facility]  ELECTRONICALLY SIGNED ON:  05/25/2016, 8:52 PM [place] PH: [phone]   FX: [phone] [place]

## 2016-05-27 ENCOUNTER — Ambulatory Visit (INDEPENDENT_AMBULATORY_CARE_PROVIDER_SITE_OTHER): Payer: BLUE CROSS/BLUE SHIELD | Admitting: Medical

## 2016-05-27 ENCOUNTER — Encounter: Payer: Self-pay | Admitting: Medical

## 2016-05-27 VITALS — BP 118/69 | HR 82 | Temp 98.7°F | Ht 67.0 in | Wt 248.0 lb

## 2016-05-27 DIAGNOSIS — J309 Allergic rhinitis, unspecified: Secondary | ICD-10-CM

## 2016-05-27 DIAGNOSIS — J320 Chronic maxillary sinusitis: Secondary | ICD-10-CM | POA: Diagnosis not present

## 2016-05-27 MED ORDER — LEVOFLOXACIN 500 MG PO TABS: 500.0000 mg | ORAL_TABLET | Freq: Every day | ORAL | 0 refills | Status: DC

## 2016-05-27 MED ORDER — AZELASTINE HCL 0.1 % NA SOLN: 2.0000 | Freq: Two times a day (BID) | NASAL | 3 refills | Status: DC

## 2016-05-27 NOTE — Progress Notes (Signed)
Pre visit review using our clinic tool,if applicable. No additional management support is needed unless otherwise documented below in the visit note.  

## 2016-05-27 NOTE — Progress Notes (Signed)
Subjective:    Patient ID: Alexandra Mata, female    DOB: [birth year], 50 y.o.   MRN: [id number]  HPI  Pt in today reporting some sinus pressure recently. Pt states sinus pain for 2 weeks. Pt first got allergy symptoms of  runny nose, pnd and sneezing before her sinus pain. Allergy symptoms cleared most part but sinus pressure remains.  No fever,  or sweats. But some chills.   Pt treated with augmentin by her gyn.   Pt states 10 days since finished the augmentin but still has pressure. No teeth pain but blowing out colored mucuous.  LMP- menopause. Last one 2 years. Nov 7 , 2017 will get hysterecomy.  Pt has gotten sinus infection two times a year past couple of years.  No hx of diabetes. Considered steroid injection today. But she thinks flonase hypes her up a little so decided against depomedrol.   Review of Systems  Constitutional: Negative for chills, fatigue and fever.  HENT: Positive for congestion and sinus pressure. Negative for ear pain, postnasal drip, sneezing, sore throat, tinnitus and trouble swallowing.   Respiratory: Negative for cough and wheezing.   Cardiovascular: Negative for chest pain and palpitations.  Gastrointestinal: Negative for abdominal pain.  Musculoskeletal: Negative for back pain and myalgias.  Skin: Negative for rash.  Neurological: Negative for dizziness and headaches.  Hematological: Negative for adenopathy. Does not bruise/bleed easily.    Past Medical History:  Diagnosis Date  . Anemia 05/17/2011  . Asymptomatic varicose veins   . Edema   . Fibromyalgia   . History of chicken pox   . Obesity   . Osteoarthritis   . Palpitations    improved with cutting out caffeine  . Pneumonia   . Seizures ([place])    Pt states she had a couple of seizures in high school of undetermined cause-last 27 years ago--  . Unspecified sinusitis (chronic)      Social History   Social History  . Marital status: Married    Spouse name: N/A  . Number of  children: 6  . Years of education: N/A   Occupational History  . Not on file.   Social History Main Topics  . Smoking status: Never Smoker  . Smokeless tobacco: Never Used  . Alcohol use No  . Drug use: No  . Sexual activity: Yes   Other Topics Concern  . Not on file   Social History Narrative   Regular exercise:  No   Caffeine Use:  No   3 biological children, 3 [place]- two children   [place]- Daughter Larene Beach (three children)      Etoh- denies   Never smoked.   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      Married to Liborio Nixon    Past Surgical History:  Procedure Laterality Date  . ABLATION SAPHENOUS VEIN W/ RFA     3 yrs ago- bilateral  . TUBAL LIGATION  1991   `    Family History  Problem Relation Age of Onset  . Arthritis Mother   . Hypertension Mother   . Diabetes Mother   . Asthma Mother   . Hypertension Father   . Diabetes Maternal Grandmother   . Hypertension Maternal Grandmother   . Heart disease Maternal Grandmother   . Diabetes Paternal Grandmother   . Cancer Paternal Grandfather     lung  .  Coronary artery disease Brother 73    MI  . Colon cancer Neg Hx   . Colon polyps Neg Hx   . Thyroid cancer Father     Allergies  Allergen Reactions  . Adhesive [Tape] Swelling and Rash    Blisters, swelling & burn of skin [Teflon tape at Sleep Study]    Current Outpatient Prescriptions on File Prior to Visit  Medication Sig Dispense Refill  . diltiazem (CARDIZEM) 30 MG tablet Take one tablet by mouth every 4 hours AS NEEDED for A-fib HR > 100 as long as BP > 100. 30 tablet 2  . DULoxetine (CYMBALTA) 60 MG capsule Take 1 capsule (60 mg total) by mouth daily. 30 capsule 0  . erythromycin ophthalmic ointment Apply 1 application to the left eyelid twice daily for 5-7 days 3.5 g 0  . furosemide (LASIX) 20 MG tablet TAKE ONE TABLET BY MOUTH ONCE DAILY 30 tablet 0  .  ibuprofen (ADVIL,MOTRIN) 200 MG tablet Take 200 mg by mouth every 6 (six) hours as needed for moderate pain.     Marland Kitchen loratadine-pseudoephedrine (CLARITIN-D 24 HOUR) 10-240 MG per 24 hr tablet Take 1 tablet by mouth daily. 60 tablet 0  . Multiple Vitamin (MULTIVITAMIN WITH MINERALS) TABS tablet Take 1 tablet by mouth daily.    Marland Kitchen OVER THE COUNTER MEDICATION Fish Oil 1000mg  with Omega-3 300mg -Take 1 capsule twice a day.    . traMADol (ULTRAM) 50 MG tablet Take 1 tablet (50 mg total) by mouth every 6 (six) hours as needed. 20 tablet 0   No current facility-administered medications on file prior to visit.     BP 118/69   Pulse 82   Temp 98.7 F (37.1 C) (Oral)   Ht 5\' 7"  (1.702 m)   Wt 248 lb (112.5 kg)   LMP 04/15/2013   SpO2 98%   BMI 38.84 kg/m       Objective:   Physical Exam   General  Mental Status - Alert. General Appearance - Well groomed. Not in acute distress.  Skin Rashes- No Rashes.  HEENT Head- Normal. Ear Auditory Canal - Left- Normal. Right - Normal.Tympanic Membrane- Left- Normal. Right- Normal. Eye Sclera/Conjunctiva- Left- Normal. Right- Normal. Nose & Sinuses Nasal Mucosa- Left-  Boggy and Congested. Right-  Boggy and  Congested.Bilateral maxillary and   frontal sinus pressure. Mouth & Throat Lips: Upper Lip- Normal: no dryness, cracking, pallor, cyanosis, or vesicular eruption. Lower Lip-Normal: no dryness, cracking, pallor, cyanosis or vesicular eruption. Buccal Mucosa- Bilateral- No Aphthous ulcers. Oropharynx- No Discharge or Erythema. Tonsils: Characteristics- Bilateral- No Erythema or Congestion. Size/Enlargement- Bilateral- No enlargement. Discharge- bilateral-None.  Neck Neck- Supple. No Masses.   Chest and Lung Exam Auscultation: Breath Sounds:-Clear even and unlabored.  Cardiovascular Auscultation:Rythm- Regular, rate and rhythm. Murmurs & Other Heart Sounds:Ausculatation of the heart reveal- No Murmurs.  Lymphatic Head & Neck General  Head & Neck Lymphatics: Bilateral: Description- No Localized lymphadenopathy.      Assessment & Plan:  Your appear to have a sinus infection. I am prescribing levofloxin  antibiotic for the infection. To help with the nasal congestion I prescribed astelin(can use your flonase as well).   Rest, hydrate, tylenol for fever.  Follow up in 7 days or as needed.  If sinus pressure persist despite levofloxin then woud consider CT of the sinuses.  Jeryn Bertoni, Percell Miller, PA-C

## 2016-05-27 NOTE — Progress Notes (Signed)
05/27/16  Spoke with patient and gave titration results. She verbalized understanding. Will send orders to [facility]. Let patient know that [facility] will contact to begin the process of getting a machine for home use. After we receive documentation she has the machine in home and using it, we will call to schedule a 10 wk f/u with Dr.Turner.   Patient mentioned she will be having a hysterectomy on 06/21/16 and wanted to know about a surgical clearance. Let her know if the surgeon wants a clearance, she will fax it over. If she needs to be seen, someone will call to schedule the appt for clearance. Patient verbalized understanding.

## 2016-05-27 NOTE — Patient Instructions (Addendum)
Your appear to have a sinus infection. I am prescribing levofloxin  antibiotic for the infection. To help with the nasal congestion I prescribed astelin(can use your flonase as well).   Rest, hydrate, tylenol for fever.  Follow up in 7 days or as needed.  If sinus pressure persist despite levofloxin then woud consider CT of the sinuses.

## 2016-06-03 ENCOUNTER — Other Ambulatory Visit: Payer: Self-pay | Admitting: Physician Assistant

## 2016-06-03 DIAGNOSIS — B9689 Other specified bacterial agents as the cause of diseases classified elsewhere: Secondary | ICD-10-CM

## 2016-06-03 DIAGNOSIS — J019 Acute sinusitis, unspecified: Principal | ICD-10-CM

## 2016-06-05 MED ORDER — FUROSEMIDE 20 MG PO TABS
20.0000 mg | ORAL_TABLET | Freq: Every day | ORAL | 0 refills | Status: DC
Start: 2016-06-05 — End: 2016-07-15

## 2016-06-05 MED ORDER — LORATADINE-PSEUDOEPHEDRINE ER 10-240 MG PO TB24: 1.0000 | ORAL_TABLET | Freq: Every day | ORAL | 0 refills | Status: AC

## 2016-06-06 ENCOUNTER — Encounter: Payer: Self-pay | Admitting: Cardiology

## 2016-06-08 ENCOUNTER — Ambulatory Visit (INDEPENDENT_AMBULATORY_CARE_PROVIDER_SITE_OTHER): Payer: BLUE CROSS/BLUE SHIELD | Admitting: Cardiology

## 2016-06-08 ENCOUNTER — Encounter: Payer: Self-pay | Admitting: Cardiology

## 2016-06-08 VITALS — BP 126/84 | HR 82 | Ht 67.0 in | Wt 248.0 lb

## 2016-06-08 DIAGNOSIS — I480 Paroxysmal atrial fibrillation: Secondary | ICD-10-CM

## 2016-06-08 LAB — T4, FREE: Free T4: 0.9 ng/dL (ref 0.8–1.8)

## 2016-06-08 LAB — TSH: TSH: 2.72 mIU/L

## 2016-06-08 LAB — T3, FREE: T3, Free: 3.2 pg/mL (ref 2.3–4.2)

## 2016-06-08 MED ORDER — DILTIAZEM HCL ER COATED BEADS 120 MG PO CP24: 120.0000 mg | ORAL_CAPSULE | Freq: Every day | ORAL | 5 refills | Status: DC

## 2016-06-08 NOTE — Patient Instructions (Signed)
Medication Instructions:  Your physician has recommended you make the following change in your medication:  START Cardizem 120mg  daily. An Rx has been sent to your pharmacy   Labwork: Tsh, Free T3, Free T4 today  Testing/Procedures: None ordered  Follow-Up: Your physician recommends that you schedule a follow-up appointment in: 2 weeks with the Afib Clinic   Any Other Special Instructions Will Be Listed Below (If Applicable).     If you need a refill on your cardiac medications before your next appointment, please call your pharmacy.

## 2016-06-08 NOTE — Progress Notes (Signed)
06/08/2016 Alexandra Mata   28-Dec-1965  [id number]  Primary Physician Leeanne Rio, PA-C Primary Cardiologist: Dr. Radford Pax Electrophysiologist: [facility]  Reason for Visit/CC: F/u for Atrial Fibrillation  HPI:  Alexandra Mata is a 50 year old female with a history of newly diagnosed paroxysmal atrial fibrillation. She has no other significant past medical history. Her CHA2DS2 VASc score is 1 for female sex.  Her atrial fibrillation was diagnosed in June 2017. She developed sudden symptoms of tachy palpitations and chest tightness while driving. She drover herself to the emergency department and was found to be in new onset atrial fibrillation with RVR. She was cardioverted in the ED and discharged home on PRN Cardizem and Xarelto. She was continued onto Xarelto for a duration of 4 weeks given a CHA2DS2 VASc score of only 1. She was seen in the A. Fib Clinic for follow-up. At that time, she was doing well without any significant recurrence in symptoms. She was continued on PRN Cardizem. Her Xarelto was discontinued after 4 weeks. She also recently underwent a sleep study which confirmed the presence of obstructive sleep apnea. This is being followed by Dr. Radford Pax. She is awaiting delivery of her CPAP device. After her diagnosis, she was evaluated with a 2-D echocardiogram which revealed normal left ventricular function.  She presents to general cardiology clinic today with complaints of recurrent tachypalpitations and chest tightness.  She has had frequent recurrence of tachy palpitations with rates fluctuating into the 160s. She is symptomatic with this with chest discomfort and dizziness. She takes PRN Cardizem, 30 mg tablets for breakthrough atrial fibrillation, which seems to help. Her episodes typically lasts several minutes. She denies any prolonged episodes.  EKG today demonstrates normal sinus rhythm with a rate of 82 bpm. Blood pressure is 126/84. She note  avoidance of triggers. No  caffeine and no alcohol.   Labs have been reviewed. She has not had recent thyroid function studies since the diagnosis of paroxysmal atrial fibrillation was made.  Current Meds  Medication Sig  . diltiazem (CARDIZEM) 30 MG tablet Take one tablet by mouth every 4 hours AS NEEDED for A-fib HR > 100 as long as BP > 100.  Marland Kitchen DULoxetine (CYMBALTA) 60 MG capsule Take 1 capsule (60 mg total) by mouth daily.  . furosemide (LASIX) 20 MG tablet Take 1 tablet (20 mg total) by mouth daily.  Marland Kitchen ibuprofen (ADVIL,MOTRIN) 200 MG tablet Take 200 mg by mouth every 6 (six) hours as needed for moderate pain.   Marland Kitchen loratadine-pseudoephedrine (CLARITIN-D 24 HOUR) 10-240 MG 24 hr tablet Take 1 tablet by mouth daily.  . traMADol (ULTRAM) 50 MG tablet Take 50 mg by mouth every 6 (six) hours as needed for moderate pain.   Allergies  Allergen Reactions  . Adhesive [Tape] Swelling and Rash    Blisters, swelling & burn of skin [Teflon tape at Sleep Study]   Past Medical History:  Diagnosis Date  . Anemia 05/17/2011  . Asymptomatic varicose veins   . Edema   . Fibromyalgia   . History of chicken pox   . Obesity   . Osteoarthritis   . Palpitations    improved with cutting out caffeine  . Pneumonia   . Seizures ([place])    Pt states she had a couple of seizures in high school of undetermined cause-last 27 years ago--  . Unspecified sinusitis (chronic)    Family History  Problem Relation Age of Onset  . Arthritis Mother   . Hypertension Mother   .  Diabetes Mother   . Asthma Mother   . Hypertension Father   . Thyroid cancer Father   . Cancer Paternal Grandfather     lung  . Diabetes Maternal Grandmother   . Hypertension Maternal Grandmother   . Heart disease Maternal Grandmother   . Diabetes Paternal Grandmother   . Coronary artery disease Brother 40    MI  . Colon cancer Neg Hx   . Colon polyps Neg Hx    Past Surgical History:  Procedure Laterality Date  . ABLATION SAPHENOUS VEIN W/ RFA     3 yrs  ago- bilateral  . TUBAL LIGATION  1991   `   Social History   Social History  . Marital status: Married    Spouse name: N/A  . Number of children: 6  . Years of education: N/A   Occupational History  . Not on file.   Social History Main Topics  . Smoking status: Never Smoker  . Smokeless tobacco: Never Used  . Alcohol use No  . Drug use: No  . Sexual activity: Yes   Other Topics Concern  . Not on file   Social History Narrative   Regular exercise:  No   Caffeine Use:  No   3 biological children, 3 [place]- two children   [place]- Daughter Larene [place] (three children)      Etoh- denies   Never smoked.   Denies drug use      [employer] Childhood Education.      Married to [employer]     Review of Systems: General: negative for chills, fever, night sweats or weight changes.  Cardiovascular: negative for chest pain, dyspnea on exertion, edema, orthopnea, palpitations, paroxysmal nocturnal dyspnea or shortness of breath Dermatological: negative for rash Respiratory: negative for cough or wheezing Urologic: negative for hematuria Abdominal: negative for nausea, vomiting, diarrhea, bright red blood per rectum, melena, or hematemesis Neurologic: negative for visual changes, syncope, or dizziness All other systems reviewed and are otherwise negative except as noted above.   Physical Exam:  Blood pressure 126/84, pulse 82, height 5\' 7"  (1.702 m), weight 248 lb (112.5 kg), last menstrual period 04/15/2013.  General appearance: alert, cooperative and no distress Neck: no carotid bruit and no JVD Lungs: clear to auscultation bilaterally Heart: regular rate and rhythm, S1, S2 normal, no murmur, click, rub or gallop Extremities: chronic bialteral LEE, no pitting edema, bilateral vericose veins noted Pulses: 2+ and symmetric Skin: Skin color, texture, turgor normal. No rashes or lesions Neurologic: Grossly  normal  EKG NSR. 82 bpm. No ischemia  ASSESSMENT AND PLAN:   1. PAF: Frequent recurrence over the last several weeks. Start daily Cardizem, 120 mg daily. Monitor blood pressure and heart rate at home. Can continue PRN Cardizem, 30 mg, for breakthrough afib. Continue to avoid triggers including no caffeine or alcohol. Given a CHA2DS2 VASc score of 1 for female sex, she will not require anticoagulation therapy. I've reviewed recent laboratory work. It appears that she has not had thyroid function study test since her diagnosis was made in June of this year. We will check a TSH as well as a free T3-T4 level today. Continue with plans for CPAP therapy once her equipment is delivered. We will have her follow-up in the A. Fib clinic in 2 weeks for reassessment. If daily Cardizem is not successful in reducing recurrence of breakthrough atrial fibrillation, will need to consider  initiation of antiarrhythmic therapy, possibly flecainide, versus consideration for A. fib ablation with Dr. Rayann Heman.  2. OSA: newly diagnosed. Patient completed sleep study and is awaiting CPAP device. This is being followed by Dr. Radford Pax.   PLAN  F/u in Afib clinic in 2 weeks.   Kewana Sanon PA-C 06/08/2016 9:09 AM

## 2016-06-09 NOTE — Patient Instructions (Signed)
Your procedure is scheduled on:  Tuesday, Nov. 7, 2017  Enter through the [employer] [facility] at:  8:30 AM  Pick up the phone at the desk and dial [phone].  Call this number if you have problems the morning of surgery: [phone].  Remember: Do NOT eat food or drink after:  Midnight Monday, Nov. 6, 2017  Take these medicines the morning of surgery with a SIP OF WATER:  Diltiazem, Cymbalta  Stop ALL herbal medications at this time   Do NOT wear jewelry (body piercing), metal hair clips/bobby pins, make-up, or nail polish. Do NOT wear lotions, powders, or perfumes.  You may wear deodorant. Do NOT shave for 48 hours prior to surgery. Do NOT bring valuables to the hospital. Contacts, dentures, or bridgework may not be worn into surgery.  Leave suitcase in car.  After surgery it may be brought to your room.  For patients admitted to the hospital, checkout time is 11:00 AM the day of discharge.

## 2016-06-10 ENCOUNTER — Encounter (HOSPITAL_COMMUNITY)
Admission: RE | Admit: 2016-06-10 | Discharge: 2016-06-10 | Disposition: A | Discharge status: Home / Self Care | Payer: BLUE CROSS/BLUE SHIELD | Source: Ambulatory Visit | Attending: Obstetrics & Gynecology | Admitting: Obstetrics & Gynecology

## 2016-06-10 ENCOUNTER — Encounter (HOSPITAL_COMMUNITY): Payer: Self-pay

## 2016-06-10 ENCOUNTER — Telehealth (HOSPITAL_COMMUNITY): Payer: Self-pay | Admitting: *Deleted

## 2016-06-10 DIAGNOSIS — Z01812 Encounter for preprocedural laboratory examination: Secondary | ICD-10-CM | POA: Insufficient documentation

## 2016-06-10 HISTORY — DX: Asymptomatic varicose veins of unspecified lower extremity: I83.90

## 2016-06-10 HISTORY — DX: Bronchitis, not specified as acute or chronic: J40

## 2016-06-10 HISTORY — DX: Depression, unspecified: F32.A

## 2016-06-10 HISTORY — DX: Other specified postprocedural states: R11.2

## 2016-06-10 HISTORY — DX: Major depressive disorder, single episode, unspecified: F32.9

## 2016-06-10 HISTORY — DX: Sleep apnea, unspecified: G47.30

## 2016-06-10 HISTORY — DX: Unspecified atrial fibrillation: I48.91

## 2016-06-10 HISTORY — DX: Other specified postprocedural states: Z98.890

## 2016-06-10 LAB — CBC
HCT: 40.7 % (ref 36.0–46.0)
HEMOGLOBIN: 13.3 g/dL (ref 12.0–15.0)
MCH: 29.3 pg (ref 26.0–34.0)
MCHC: 32.7 g/dL (ref 30.0–36.0)
MCV: 89.6 fL (ref 78.0–100.0)
Platelets: 236 10*3/uL (ref 150–400)
RBC: 4.54 MIL/uL (ref 3.87–5.11)
RDW: 13.4 % (ref 11.5–15.5)
WBC: 10 10*3/uL (ref 4.0–10.5)

## 2016-06-10 LAB — BASIC METABOLIC PANEL
Anion gap: 7 (ref 5–15)
BUN: 25 mg/dL — AB (ref 6–20)
CHLORIDE: 103 mmol/L (ref 101–111)
CO2: 29 mmol/L (ref 22–32)
Calcium: 9 mg/dL (ref 8.9–10.3)
Creatinine, Ser: 0.75 mg/dL (ref 0.44–1.00)
GFR calc Af Amer: >60 mL/min (ref >60)
GFR calc non Af Amer: >60 mL/min (ref >60)
GLUCOSE: 108 mg/dL — AB (ref 65–99)
POTASSIUM: 4.2 mmol/L (ref 3.5–5.1)
SODIUM: 139 mmol/L (ref 135–145)

## 2016-06-10 NOTE — Telephone Encounter (Signed)
Spoke to pt and scheduled for 2 week follow up

## 2016-06-10 NOTE — Telephone Encounter (Signed)
-----   Message from Jeanie Sewer sent at 06/08/2016  9:43 AM EDT ----- Regarding: 2 week f/u appt in afib clinic  Contact: [phone] Pt needs a 2 week f/u for afib clinic.    Alexandra Mata

## 2016-06-16 ENCOUNTER — Telehealth: Payer: BLUE CROSS/BLUE SHIELD | Admitting: Physician Assistant

## 2016-06-16 DIAGNOSIS — M7989 Other specified soft tissue disorders: Secondary | ICD-10-CM

## 2016-06-16 DIAGNOSIS — M549 Dorsalgia, unspecified: Secondary | ICD-10-CM

## 2016-06-16 NOTE — Progress Notes (Signed)
Based on what you shared with me it looks like you have a serious condition that should be evaluated in a face to face office visit. Alexandra Mata you need to be seen in our office today or at an Urgent Care. I am out of the office until tomorrow but your symptoms warrant assessment today. If symptoms are severe, please go to the ER.  If you are having a true medical emergency please call 911.  If you need an urgent face to face visit, [facility] has four urgent care centers for your convenience.  If you need care fast and have a high deductible or no insurance consider:   [email or web address]  [phone]  [address], [place], [place] [address] 8 am to 8 pm Monday-Friday 10 am to 4 pm Saturday-Sunday   The following sites will take your  insurance:    . [facility]gent [place] a Provider at this Location  [address] [place], [place] [address] . 10 am to 8 pm Monday-Friday . 12 pm to 8 pm Saturday-Sunday   . [facility] at [place] a Provider at this Location  [address], [place], [place] [address] . 8 am to 8 pm Monday-Friday . 9 am to 6 pm Saturday . 11 am to 6 pm Sunday   . [facility] at [place] Get Driving Directions  [id number], [place] [address] . 8 am to 8 pm Monday-Friday . 8 am to 4 pm Saturday-Sunday   . [facility] (walk-ins welcome, or call for a scheduled time)  [phone]  Get Driving Directions Find a Provider at this Location  [place], [place] [address] . 8 am to 8:30 pm Monday-Thursday . 8 am to 6 pm Friday . 8 am to 4 pm Saturday-Sunday   Your e-visit answers were reviewed by a board certified advanced clinical practitioner to complete your personal care plan.  Thank you for using e-Visits.

## 2016-06-20 ENCOUNTER — Encounter (HOSPITAL_COMMUNITY): Payer: Self-pay | Admitting: Nurse Practitioner

## 2016-06-20 ENCOUNTER — Ambulatory Visit (HOSPITAL_COMMUNITY)
Admission: RE | Admit: 2016-06-20 | Discharge: 2016-06-20 | Disposition: A | Discharge status: Home / Self Care | Payer: BLUE CROSS/BLUE SHIELD | Source: Ambulatory Visit | Attending: Nurse Practitioner | Admitting: Nurse Practitioner

## 2016-06-20 VITALS — BP 118/82 | HR 82 | Ht 67.0 in | Wt 250.6 lb

## 2016-06-20 DIAGNOSIS — G4733 Obstructive sleep apnea (adult) (pediatric): Secondary | ICD-10-CM | POA: Diagnosis not present

## 2016-06-20 DIAGNOSIS — R002 Palpitations: Secondary | ICD-10-CM | POA: Diagnosis present

## 2016-06-20 DIAGNOSIS — Z8249 Family history of ischemic heart disease and other diseases of the circulatory system: Secondary | ICD-10-CM | POA: Insufficient documentation

## 2016-06-20 DIAGNOSIS — Z808 Family history of malignant neoplasm of other organs or systems: Secondary | ICD-10-CM | POA: Insufficient documentation

## 2016-06-20 DIAGNOSIS — E669 Obesity, unspecified: Secondary | ICD-10-CM | POA: Insufficient documentation

## 2016-06-20 DIAGNOSIS — M1990 Unspecified osteoarthritis, unspecified site: Secondary | ICD-10-CM | POA: Diagnosis not present

## 2016-06-20 DIAGNOSIS — M797 Fibromyalgia: Secondary | ICD-10-CM | POA: Insufficient documentation

## 2016-06-20 DIAGNOSIS — I868 Varicose veins of other specified sites: Secondary | ICD-10-CM | POA: Insufficient documentation

## 2016-06-20 DIAGNOSIS — I480 Paroxysmal atrial fibrillation: Secondary | ICD-10-CM | POA: Diagnosis not present

## 2016-06-20 DIAGNOSIS — F329 Major depressive disorder, single episode, unspecified: Secondary | ICD-10-CM | POA: Insufficient documentation

## 2016-06-20 NOTE — Progress Notes (Signed)
Patient ID: Alexandra Mata, female   DOB: [birth year], 50 y.o.   MRN: [id number]     Primary Care Physician: Leeanne Rio, PA-C Referring Physician: Lyda Jester, PA   DEMA MCKIM is a 50 y.o. female with a h/o LLE, who presented to the ED due to sudden onset palpitations that began about 45 minutes prior to arrival while driving, associated with chest pain, shortness of breath. Drove herself to the ED where she was found to be in a-fib with RVR, 163 bpm. No recent illness, nausea, vomiting, diarrhea, increased leg swelling. She was cardioverted to SR and d/c on xarelto 20 mg qd for a chadsvasc score of 1. Was seen originally  in [facility] f/u ER visit. No further issues with afib since discharged. Lifestyle reviewed, no alcohol, tobacco, caffeine. She does snore with daytime somnolence.   Echo showed Normal EF. No regular exercise, obese.Positive for fibromyalgia and osteoarthritis.  She had f/u in afib clinic 8/17. She has completed 30 days of xarelto and is now off drug with a chadsvasc score of 1(female). She had noted HR racing up to 140 x 2 but for less than a minute each time. She did have a positive sleep study and is pending cpap trial. Echo showed normal EF and normal left atrial size.  Referred to afib clinic after visit with B. Simmons, [place], she was noted to be in Afib. She was started on daily cardizem and reports some h/a's with it. Still has some breakthroug afib.She is in SR today and is pending hysterectomy tomorrow.   Today, she denies symptoms of  orthopnea, PND, lower extremity edema, dizziness, presyncope, syncope, or neurologic sequela.She is short of breath with activity and occasionally will note chest heaviness that will be relieved with rest. Occasional palpitations that are short lived.  The patient is tolerating medications without difficulties and is otherwise without complaint today.   Past Medical History:  Diagnosis Date  . Anemia 05/17/2011  .  Asymptomatic varicose veins   . Atrial fibrillation ([place])   . Bronchitis    several times  . Depression   . Edema   . Fibromyalgia   . Headache   . History of chicken pox   . Obesity   . Osteoarthritis   . Palpitations    improved with cutting out caffeine  . Pneumonia   . PONV (postoperative nausea and vomiting)   . Seizures ([place])    Pt states she had a couple of seizures in high school of undetermined cause-last 27 years ago--  . Sleep apnea   . Unspecified sinusitis (chronic)   . Varicose vein of leg    Past Surgical History:  Procedure Laterality Date  . ABLATION SAPHENOUS VEIN W/ RFA     3 yrs ago- bilateral  . COLONOSCOPY    . TUBAL LIGATION  1991   `  . VARICOSE VEIN SURGERY      Current Outpatient Prescriptions  Medication Sig Dispense Refill  . diltiazem (CARDIZEM CD) 120 MG 24 hr capsule Take 1 capsule (120 mg total) by mouth daily. 30 capsule 5  . diltiazem (CARDIZEM) 30 MG tablet Take one tablet by mouth every 4 hours AS NEEDED for A-fib HR > 100 as long as BP > 100. 30 tablet 2  . DULoxetine (CYMBALTA) 60 MG capsule Take 1 capsule (60 mg total) by mouth daily. 30 capsule 0  . furosemide (LASIX) 20 MG tablet Take 1 tablet (20 mg total) by mouth daily. [place]  tablet 0  . ibuprofen (ADVIL,MOTRIN) 200 MG tablet Take 200 mg by mouth every 6 (six) hours as needed for moderate pain.     Marland Kitchen loratadine-pseudoephedrine (CLARITIN-D 24 HOUR) 10-240 MG 24 hr tablet Take 1 tablet by mouth daily. 60 tablet 0  . traMADol (ULTRAM) 50 MG tablet Take 50 mg by mouth every 6 (six) hours as needed for moderate pain.     No current facility-administered medications for this encounter.     Allergies  Allergen Reactions  . Adhesive [Tape] Swelling and Rash    Blisters, swelling & burn of skin [Teflon tape at Sleep Study]    Social History   Social History  . Marital status: Married    Spouse name: N/A  . Number of children: 6  . Years of education: N/A   Occupational History    . Not on file.   Social History Main Topics  . Smoking status: Never Smoker  . Smokeless tobacco: Never Used  . Alcohol use No  . Drug use: No  . Sexual activity: Yes    Birth control/ protection: Post-menopausal   Other Topics Concern  . Not on file   Social History Narrative   Regular exercise:  No   Caffeine Use:  No   3 biological children, 3 [place]- two children   [place]- Daughter Larene Beach (three children)      Etoh- denies   Never smoked.   Denies drug use      [employer].      Married to [employer]    Family History  Problem Relation Age of Onset  . Arthritis Mother   . Hypertension Mother   . Diabetes Mother   . Asthma Mother   . Hypertension Father   . Thyroid cancer Father   . Cancer Paternal Grandfather     lung  . Diabetes Maternal Grandmother   . Hypertension Maternal Grandmother   . Heart disease Maternal Grandmother   . Diabetes Paternal Grandmother   . Coronary artery disease Brother 65    MI  . Colon cancer Neg Hx   . Colon polyps Neg Hx     ROS- All systems are reviewed and negative except as per the HPI above  Physical Exam: Vitals:   06/20/16 0841  BP: 118/82  Pulse: 82  Weight: 250 lb 9.6 oz (113.7 kg)  Height: 5\' 7"  (1.702 m)    GEN- The patient is well appearing, alert and oriented x 3 today.   Head- normocephalic, atraumatic Eyes-  Sclera clear, conjunctiva pink Ears- hearing intact Oropharynx- clear Neck- supple, no JVP Lymph- no cervical lymphadenopathy Lungs- Clear to ausculation bilaterally, normal work of breathing Heart- Regular rate and rhythm, no murmurs, rubs or gallops, PMI not laterally displaced GI- soft, NT, ND, + BS Extremities- no clubbing, cyanosis, or edema MS- no significant deformity or atrophy Skin- no rash or lesion Psych- euthymic mood, full affect Neuro- strength and sensation are intact  EKG-NSR at 82 bpm,  Pr int 144 ms, qrs int 90 ms, qtc 455 ms Epic records reviewed    Assessment and Plan: 1. Paroxysmal afib On cardizem daily and for now with hysterectomy pending will continue daily CCB. Now off xarelto, taken x one month f/u cardioversion,   with chadsvasc score of 1 for female Echo reviewed Tested positive for OSA and is awaiting cpap from [facility] Encouraged regular exercise  and weight loss Cardizem 30 mg  to take one every 4-6 hours as needed for fast irregular heart rate Discussed with pt starting antiarrythmic but with surgery tomorrow, this is not a good time to initiate this.  F/u  afib clinic after surgery approximately 6 weeks, sooner if needed Discussed antiarrythmic's, multaq or flecainide would be good to try but would need a negative  stress test, to initiate  Flecainide Pt would not be an ablation candidate unless she is symptomatic with afib and has failed at least one antiarrythmic   F/u in 6 weeks sooner if needed Dr. Radford Pax per CPAP  Geroge Baseman. Piya Mesch, [facility] [address] [place], [place] [address] [phone]

## 2016-06-21 ENCOUNTER — Other Ambulatory Visit: Payer: Self-pay | Admitting: Physician Assistant

## 2016-06-21 ENCOUNTER — Telehealth: Payer: Self-pay | Admitting: Cardiology

## 2016-06-21 ENCOUNTER — Ambulatory Visit (HOSPITAL_COMMUNITY): Payer: BLUE CROSS/BLUE SHIELD | Admitting: Anesthesiology

## 2016-06-21 ENCOUNTER — Ambulatory Visit (HOSPITAL_COMMUNITY)
Admission: RE | Admit: 2016-06-21 | Discharge: 2016-06-21 | Disposition: A | Discharge status: Home / Self Care | Payer: BLUE CROSS/BLUE SHIELD | Source: Ambulatory Visit | Attending: Obstetrics & Gynecology | Admitting: Obstetrics & Gynecology

## 2016-06-21 ENCOUNTER — Encounter (HOSPITAL_COMMUNITY)
Admission: RE | Disposition: A | Discharge status: Home / Self Care | Payer: Self-pay | Source: Ambulatory Visit | Attending: Obstetrics & Gynecology

## 2016-06-21 ENCOUNTER — Encounter (HOSPITAL_COMMUNITY): Payer: Self-pay

## 2016-06-21 ENCOUNTER — Other Ambulatory Visit: Payer: Self-pay

## 2016-06-21 DIAGNOSIS — Z5309 Procedure and treatment not carried out because of other contraindication: Secondary | ICD-10-CM | POA: Insufficient documentation

## 2016-06-21 DIAGNOSIS — I208 Other forms of angina pectoris: Secondary | ICD-10-CM | POA: Diagnosis not present

## 2016-06-21 DIAGNOSIS — D259 Leiomyoma of uterus, unspecified: Secondary | ICD-10-CM | POA: Diagnosis not present

## 2016-06-21 DIAGNOSIS — I4891 Unspecified atrial fibrillation: Secondary | ICD-10-CM | POA: Diagnosis not present

## 2016-06-21 DIAGNOSIS — Z79899 Other long term (current) drug therapy: Secondary | ICD-10-CM | POA: Insufficient documentation

## 2016-06-21 DIAGNOSIS — R072 Precordial pain: Secondary | ICD-10-CM

## 2016-06-21 LAB — PREGNANCY, URINE: PREG TEST UR: NEGATIVE

## 2016-06-21 SURGERY — HYSTERECTOMY, VAGINAL, LAPAROSCOPY-ASSISTED, WITH SALPINGO-OOPHORECTOMY
Anesthesia: Choice | Site: Vagina | Laterality: Bilateral

## 2016-06-21 MED ORDER — LIDOCAINE HCL (CARDIAC) 20 MG/ML IV SOLN
INTRAVENOUS | Status: AC
  Filled 2016-06-21: qty 5

## 2016-06-21 MED ORDER — PROPOFOL 10 MG/ML IV BOLUS
INTRAVENOUS | Status: AC
  Filled 2016-06-21: qty 20

## 2016-06-21 MED ORDER — BUPIVACAINE HCL (PF) 0.5 % IJ SOLN
INTRAMUSCULAR | Status: AC
  Filled 2016-06-21: qty 30

## 2016-06-21 MED ORDER — BUPIVACAINE-EPINEPHRINE (PF) 0.5% -1:200000 IJ SOLN
INTRAMUSCULAR | Status: AC
Start: 2016-06-21 — End: 2016-06-21
  Filled 2016-06-21: qty 30

## 2016-06-21 MED ORDER — SODIUM CHLORIDE 0.9 % IJ SOLN
INTRAMUSCULAR | Status: AC
  Filled 2016-06-21: qty 100

## 2016-06-21 MED ORDER — CEFAZOLIN SODIUM-DEXTROSE 2-4 GM/100ML-% IV SOLN: 2.0000 g | INTRAVENOUS | Status: DC

## 2016-06-21 MED ORDER — LACTATED RINGERS IV SOLN: INTRAVENOUS | Status: DC

## 2016-06-21 MED ORDER — SCOPOLAMINE 1 MG/3DAYS TD PT72
MEDICATED_PATCH | TRANSDERMAL | Status: AC
  Administered 2016-06-21: 1.5 mg via TRANSDERMAL
  Filled 2016-06-21: qty 1

## 2016-06-21 MED ORDER — FENTANYL CITRATE (PF) 250 MCG/5ML IJ SOLN
INTRAMUSCULAR | Status: AC
  Filled 2016-06-21: qty 5

## 2016-06-21 MED ORDER — DEXAMETHASONE SODIUM PHOSPHATE 10 MG/ML IJ SOLN
INTRAMUSCULAR | Status: AC
  Filled 2016-06-21: qty 1

## 2016-06-21 MED ORDER — SCOPOLAMINE 1 MG/3DAYS TD PT72
1.0000 | MEDICATED_PATCH | Freq: Once | TRANSDERMAL | Status: DC
  Administered 2016-06-21: 1.5 mg via TRANSDERMAL

## 2016-06-21 MED ORDER — MIDAZOLAM HCL 2 MG/2ML IJ SOLN
INTRAMUSCULAR | Status: AC
  Filled 2016-06-21: qty 2

## 2016-06-21 NOTE — Telephone Encounter (Signed)
New message      Pt was due to have surgery.  The anesthesiologist said pt needs to be cleared.  He want her to see a physician only---not PA/NP as soon as possible.  He also want pt to have a stress test---but let the cardiologist decide if it is nuclear or regular treadmill.  Please call nurse with an appt with the doctor only.  Offered to let anesthesiologist talk to DOD but they declined.  Please call today if possible.

## 2016-06-21 NOTE — Telephone Encounter (Addendum)
error 

## 2016-06-21 NOTE — Anesthesia Preprocedure Evaluation (Addendum)
Anesthesia Evaluation  Patient identified by MRN, date of birth, ID band Patient awake    Reviewed: Allergy & Precautions, NPO status , Patient's Chart, lab work & pertinent test results  Airway Mallampati: II  TM Distance: >3 FB Neck ROM: Full    Dental  (+) Dental Advisory Given   Pulmonary sleep apnea ,    breath sounds clear to auscultation       Cardiovascular + Peripheral Vascular Disease  + dysrhythmias Atrial Fibrillation  Rhythm:Regular Rate:Normal     Neuro/Psych Seizures -,  Depression  Neuromuscular disease    GI/Hepatic negative GI ROS, Neg liver ROS,   Endo/Other  Morbid obesity  Renal/GU negative Renal ROS     Musculoskeletal  (+) Arthritis , Fibromyalgia -  Abdominal   Peds  Hematology negative hematology ROS (+)   Anesthesia Other Findings   Reproductive/Obstetrics                             Lab Results  Component Value Date   WBC 10.0 06/10/2016   HGB 13.3 06/10/2016   HCT 40.7 06/10/2016   MCV 89.6 06/10/2016   PLT 236 06/10/2016   Lab Results  Component Value Date   CREATININE 0.75 06/10/2016   BUN 25 (H) 06/10/2016   NA 139 06/10/2016   K 4.2 06/10/2016   CL 103 06/10/2016   CO2 29 06/10/2016    Anesthesia Physical Anesthesia Plan  ASA: III  Anesthesia Plan: General   Post-op Pain Management:    Induction: Intravenous  Airway Management Planned: Oral ETT  Additional Equipment:   Intra-op Plan:   Post-operative Plan: Extubation in OR  Informed Consent: I have reviewed the patients History and Physical, chart, labs and discussed the procedure including the risks, benefits and alternatives for the proposed anesthesia with the patient or authorized representative who has indicated his/her understanding and acceptance.   Dental advisory given  Plan Discussed with:   Anesthesia Plan Comments: (Surgery cancelled due to exertional angina without  previous cardiac stress testing or cardiac clearance. Pt scheduled for cardiologist appointment.)       Anesthesia Quick Evaluation

## 2016-06-21 NOTE — Telephone Encounter (Signed)
Return call to Arbie Cookey with [facility].Stated patient was scheduled to have a hysterectomy this morning.Surgery was cancelled after patient telling anesthesia she has been having chest pain on exertion.Stated she was seen by a PA yesterday and was told she will order stress test after surgery.Stated anesthesiologist advised she needs to see a cardiologist only will need a stress test to clear her for surgery as soon as possible.Advised I will send message to Dr.Hochrein to see if we can add her to his schedule Monday 06/27/16 and see if he will order a stress test before Mon 11/13.Stated she will need Dr.Hochrein's office note and stress test report faxed to her at fax # [phone].

## 2016-06-22 ENCOUNTER — Telehealth: Payer: Self-pay | Admitting: *Deleted

## 2016-06-22 MED ORDER — DULOXETINE HCL 60 MG PO CPEP: 60.0000 mg | ORAL_CAPSULE | Freq: Every day | ORAL | 1 refills | Status: DC

## 2016-06-22 NOTE — Telephone Encounter (Signed)
Pt left message yesterday on nurse voice mail stating that the message was for Dr. Hulan Fray.  She stated that she has appointment scheduled on 11/9 for stress test @ 0800 and should have word by noon. She is wanting to have her surgery scheduled even as early as Friday 11/10 because she is ready to get this done!  Per chart review, pt does have 3 appointments beginning @ 0800 on 11/9 related to stress test. She also has appointment on 11/13 with Dr. Eulas Post for surgical clearance. I called pt and informed her that surgery will not be rescheduled until the letter of surgical clearance and stress test report have been received by Dr. Alease Medina office. She will need to meet with the cardiologist as scheduled on 11/13 in order for this to take place. I provided the fax  # [phone] to pt in case the cardiology office does not have it. Pt voiced understanding of all information given.

## 2016-06-23 ENCOUNTER — Encounter (HOSPITAL_COMMUNITY)
Admission: RE | Admit: 2016-06-23 | Discharge: 2016-06-23 | Disposition: A | Discharge status: Home / Self Care | Payer: BLUE CROSS/BLUE SHIELD | Source: Ambulatory Visit | Attending: Cardiology | Admitting: Cardiology

## 2016-06-23 DIAGNOSIS — R072 Precordial pain: Secondary | ICD-10-CM | POA: Insufficient documentation

## 2016-06-23 LAB — NM MYOCAR MULTI W/SPECT W/WALL MOTION / EF
CHL CUP NUCLEAR SDS: 1
CHL CUP RESTING HR STRESS: 65 {beats}/min
CHL CUP STRESS STAGE 1 DBP: 85 mmHg
CHL CUP STRESS STAGE 1 SBP: 119 mmHg
CHL CUP STRESS STAGE 1 SPEED: 0 mph
CHL CUP STRESS STAGE 2 GRADE: 0 %
CHL CUP STRESS STAGE 3 GRADE: 0 %
CHL CUP STRESS STAGE 4 HR: 99 {beats}/min
CHL CUP STRESS STAGE 4 SPEED: 0 mph
CSEPEW: 1 METS
CSEPPBP: 117 mmHg
CSEPPMHR: 58 %
LHR: 0.3
LV dias vol: 125 mL (ref 46–106)
LV sys vol: 59 mL
NUC STRESS TID: 1.38
Peak HR: 99 {beats}/min
SRS: 10
SSS: 11
Stage 1 Grade: 0 %
Stage 1 HR: 74 {beats}/min
Stage 2 HR: 73 {beats}/min
Stage 2 Speed: 0 mph
Stage 3 DBP: 78 mmHg
Stage 3 HR: 93 {beats}/min
Stage 3 SBP: 115 mmHg
Stage 3 Speed: 0 mph
Stage 4 DBP: 77 mmHg
Stage 4 Grade: 0 %
Stage 4 SBP: 117 mmHg

## 2016-06-23 MED ORDER — TECHNETIUM TC 99M TETROFOSMIN IV KIT
10.0000 | PACK | Freq: Once | INTRAVENOUS | Status: AC | PRN
Start: 2016-06-23 — End: 2016-06-23
  Administered 2016-06-23: 10 via INTRAVENOUS

## 2016-06-23 MED ORDER — TECHNETIUM TC 99M TETROFOSMIN IV KIT
30.0000 | PACK | Freq: Once | INTRAVENOUS | Status: AC | PRN
  Administered 2016-06-23: 30 via INTRAVENOUS

## 2016-06-23 MED ORDER — REGADENOSON 0.4 MG/5ML IV SOLN
0.4000 mg | Freq: Once | INTRAVENOUS | Status: AC
  Administered 2016-06-23: 0.4 mg via INTRAVENOUS

## 2016-06-23 MED ORDER — REGADENOSON 0.4 MG/5ML IV SOLN
INTRAVENOUS | Status: AC
  Administered 2016-06-23: 0.4 mg via INTRAVENOUS
  Filled 2016-06-23: qty 5

## 2016-06-24 NOTE — Telephone Encounter (Signed)
Pt have appt to see Alexandra Mata on 11/13

## 2016-06-27 ENCOUNTER — Telehealth: Payer: Self-pay | Admitting: Cardiology

## 2016-06-27 ENCOUNTER — Telehealth: Payer: Self-pay

## 2016-06-27 ENCOUNTER — Encounter: Payer: Self-pay | Admitting: Physician Assistant

## 2016-06-27 ENCOUNTER — Ambulatory Visit (INDEPENDENT_AMBULATORY_CARE_PROVIDER_SITE_OTHER): Payer: BLUE CROSS/BLUE SHIELD | Admitting: Physician Assistant

## 2016-06-27 VITALS — BP 124/87 | HR 88 | Ht 67.0 in | Wt 245.2 lb

## 2016-06-27 DIAGNOSIS — Z9989 Dependence on other enabling machines and devices: Secondary | ICD-10-CM

## 2016-06-27 DIAGNOSIS — Z01810 Encounter for preprocedural cardiovascular examination: Secondary | ICD-10-CM | POA: Diagnosis not present

## 2016-06-27 DIAGNOSIS — G4733 Obstructive sleep apnea (adult) (pediatric): Secondary | ICD-10-CM | POA: Diagnosis not present

## 2016-06-27 DIAGNOSIS — I480 Paroxysmal atrial fibrillation: Secondary | ICD-10-CM

## 2016-06-27 NOTE — Telephone Encounter (Signed)
Returned call, made her aware I had called earlier but mistakenly - she already received results at appt. No further needs.

## 2016-06-27 NOTE — Progress Notes (Addendum)
Cardiology Office Note    Date:  06/27/2016   ID:  Alexandra Mata, DOB November 19, 1965, MRN [id number]  PCP:  Leeanne Rio, PA-C  Cardiologist:  Dr. Percival Spanish Obstructive sleep apnea and CPAP: Dr. Radford Pax  Surgeon: Dr. Clovia Cuff Surgery: hysterectomy for fibroid  Chief Complaint  Patient presents with  . Follow-up    seen for Dr. Percival Spanish, surgical clearance pt states no chest pain at the moment     History of Present Illness:  Alexandra Mata is a 50 y.o. female with PMH of paroxysmal atrial fibrillation and OSA. There was some possible history of pericarditis some previous note in 2016 diagnosed at [facility]. Apparently she presented with chest discomfort at the time, troponin was negative, however her sedimentation rate and c-reactive protein were mildly elevated. She finished a course of Motrin which did not offer any relief. She was evaluated in the cardiology office in October 2016, there was no indication to suggest a diagnosis of pericarditis, she was instructed to follow-up with a rheumatologist to rule out any systemic process. She was first diagnosed was PAF in June 2017 after she developed sudden symptoms of tachycardia, palpitation and chest tightness while driving. She drove herself to the emergency room and found to be in new atrial fibrillation with RVR. She was cardioverted in the ED and discharged home on PRN Cardizem and Xarelto. She was continued on Xarelto for duration of 4 weeks given a CHA2DS2-Vasc score of only 1. Her Xarelto was discontinued after 4 weeks. Echocardiogram obtained on 02/15/2016 showed EF 99991111, grade 1 diastolic dysfunction. She has since underwent sleep study which confirms presence of obstructive sleep apnea. This has been followed by Dr. Dr. Radford Pax. Her last cardiology office visit was with Alexandra Grumbling PA-C, at which time she was noted to be in atrial fibrillation and was started on daily Cardizem. She was referred back to the atrial  fibrillation clinic and was seen on 06/20/2016. Myoview obtained on 06/23/2016 showed EF 53%, mild diffuse hypokinesis, no significant perfusion defect for ischemia and infarction, overall low risk study.   She is pending hysterectomy surgery, she presents today for cardiology preop clearance. She says she has had occasional chest discomfort both at rest and during exertion, however no clear exacerbation with exertion. Her chest pain is very atypical, worse with deep palpation. She has a prior diagnosis of fibromyalgia. I have reviewed her stress test results with her which shows no ischemia or infarction. Her symptom is very atypical for any cardiac illness, no further workup was felt to be necessary.    Past Medical History:  Diagnosis Date  . Anemia 05/17/2011  . Asymptomatic varicose veins   . Atrial fibrillation ([place])   . Bronchitis    several times  . Depression   . Edema   . Fibromyalgia   . Headache   . History of chicken pox   . Obesity   . Osteoarthritis   . Palpitations    improved with cutting out caffeine  . Pneumonia   . PONV (postoperative nausea and vomiting)   . Seizures ([place])    Pt states she had a couple of seizures in high school of undetermined cause-last 27 years ago--  . Sleep apnea   . Unspecified sinusitis (chronic)   . Varicose vein of leg     Past Surgical History:  Procedure Laterality Date  . ABLATION SAPHENOUS VEIN W/ RFA     3 yrs ago- bilateral  . COLONOSCOPY    .  TUBAL LIGATION  1991   `  . VARICOSE VEIN SURGERY      Current Medications: Outpatient Medications Prior to Visit  Medication Sig Dispense Refill  . diltiazem (CARDIZEM CD) 120 MG 24 hr capsule Take 1 capsule (120 mg total) by mouth daily. 30 capsule 5  . diltiazem (CARDIZEM) 30 MG tablet Take one tablet by mouth every 4 hours AS NEEDED for A-fib HR > 100 as long as BP > 100. 30 tablet 2  . DULoxetine (CYMBALTA) 60 MG capsule Take 1 capsule (60 mg total) by mouth daily. 30 capsule 1   . furosemide (LASIX) 20 MG tablet Take 1 tablet (20 mg total) by mouth daily. 30 tablet 0  . ibuprofen (ADVIL,MOTRIN) 200 MG tablet Take 200 mg by mouth every 6 (six) hours as needed for moderate pain.     Marland Kitchen loratadine-pseudoephedrine (CLARITIN-D 24 HOUR) 10-240 MG 24 hr tablet Take 1 tablet by mouth daily. 60 tablet 0  . traMADol (ULTRAM) 50 MG tablet Take 50 mg by mouth every 6 (six) hours as needed for moderate pain.     No facility-administered medications prior to visit.      Allergies:   Adhesive [tape]   Social History   Social History  . Marital status: Married    Spouse name: N/A  . Number of children: 6  . Years of education: N/A   Social History Main Topics  . Smoking status: Never Smoker  . Smokeless tobacco: Never Used  . Alcohol use No  . Drug use: No  . Sexual activity: Yes    Birth control/ protection: Post-menopausal   Other Topics Concern  . None   Social History Narrative   Regular exercise:  No   Caffeine Use:  No   3 biological children, 3 [place]- two children   [place]- Daughter Larene Beach (three children)      Etoh- denies   Never smoked.   Denies drug use      [employer].      Married to [employer]     Family History:  The patient's family history includes Arthritis in her mother; Asthma in her mother; Cancer in her paternal grandfather; Coronary artery disease (age of onset: 38) in her brother; Diabetes in her maternal grandmother, mother, and paternal grandmother; Heart disease in her maternal grandmother; Hypertension in her father, maternal grandmother, and mother; Thyroid cancer in her father.   ROS:   Please see the history of present illness.    ROS All other systems reviewed and are negative.   PHYSICAL EXAM:   VS:  BP 124/87   Pulse 88   Ht 5\' 7"  (1.702 m)   Wt 245 lb 3.2 oz (111.2 kg)   LMP 04/15/2013   SpO2 98%   BMI 38.40 kg/m    GEN:  Well nourished, well developed, in no acute distress  HEENT: normal  Neck: no JVD, carotid bruits, or masses Cardiac: RRR; no murmurs, rubs, or gallops,no edema  Respiratory:  clear to auscultation bilaterally, normal work of breathing GI: soft, nontender, nondistended, + BS MS: no deformity or atrophy  Skin: warm and dry, no rash Neuro:  Alert and Oriented x 3, Strength and sensation are intact Psych: euthymic mood, full affect  Wt Readings from Last 3 Encounters:  06/27/16 245 lb 3.2 oz (111.2 kg)  06/20/16 250 lb 9.6 oz (113.7 kg)  06/10/16 247 lb  4 oz (112.2 kg)      Studies/Labs Reviewed:   EKG:  EKG is not ordered today.    Recent Labs: 02/04/2016: ALT 20; B Natriuretic Peptide 48.3; Magnesium 1.8 06/08/2016: TSH 2.72 06/10/2016: BUN 25; Creatinine, Ser 0.75; Hemoglobin 13.3; Platelets 236; Potassium 4.2; Sodium 139   Lipid Panel    Component Value Date/Time   CHOL 138 05/13/2011 0855   TRIG 51 05/13/2011 0855   HDL 40 05/13/2011 0855   CHOLHDL 3.5 05/13/2011 0855   VLDL 10 05/13/2011 0855   LDLCALC 88 05/13/2011 0855    Additional studies/ records that were reviewed today include:   Echo 02/15/2016 LV EF: 50% -   55%  ------------------------------------------------------------------- Indications:      Atrial fibrillation - 427.31.  ------------------------------------------------------------------- History:   Risk factors:  Obese.  ------------------------------------------------------------------- Study Conclusions  - Left ventricle: The cavity size was normal. Wall thickness was   normal. Systolic function was normal. The estimated ejection   fraction was in the range of 50% to 55%. Doppler parameters are   consistent with abnormal left ventricular relaxation (grade 1   diastolic dysfunction).  Impressions:  - Technically difficult echo .    Myoview 06/23/2016 Study Result     There was no ST segment deviation noted during  stress.  No T wave inversion was noted during stress.  The left ventricular ejection fraction is mildly decreased (45-54%).  Nuclear stress EF: 53%.  This is a low risk study.   1. EF 53%, mild diffuse hypokinesis.  2. No significant perfusion defect, so no evidence for ischemia or infarction.   3. Low risk study     ASSESSMENT:    1. Preop cardiovascular exam   2. Paroxysmal atrial fibrillation (HCC)   3. OSA on CPAP      PLAN:  In order of problems listed above:  1. Preop clearance  - Myoview obtained on 06/23/2016 has been reviewed, her chest pain is very atypical and likely due to fibromyalgia, does not have direct association was exertion, also occurs with palpation. Appears to be more musculoskeletal versus cardiac in nature.  - She is cleared from cardiology perspective to undergo hysterectomy without further workup. Myoview also has been reviewed by the patient's primary cardiologist Dr. Minus Breeding who also recommended no further workup.  2. PAF  - This patients CHA2DS2-VASc Score and unadjusted Ischemic Stroke Rate (% per year) is equal to 0.6 % stroke rate/year from a score of 1  Above score calculated as 1 point each if present [CHF, HTN, DM, Vascular=MI/PAD/Aortic Plaque, Age if 65-74, or Female] Above score calculated as 2 points each if present [Age > 75, or Stroke/TIA/TE]  - No obvious recurrence of paroxysmal atrial fibrillation, she did have some headache right after starting 120 mg daily diltiazem CD, however headache has since resolved. We'll continue on current medication. Given her low risk, she is not on any systemic anticoagulation. She did finish a course of 4 weeks of Xarelto after initial conversion to sinus rhythm several month ago.  - If she does have recurrence, we will consider antiarrhythmic medication such as flecainide  3. OSA on CPAP: Followed by Dr. Radford Pax.    Medication Adjustments/Labs and Tests Ordered: Current medicines are  reviewed at length with the patient today.  Concerns regarding medicines are outlined above.  Medication changes, Labs and Tests ordered today are listed in the Patient Instructions below. Patient Instructions  Medication Instructions:  Your physician recommends that you continue on your  current medications as directed. Please refer to the Current Medication list given to you today.  Labwork: NONE   Testing/Procedures: NONE   Follow-Up: Your physician recommends that you schedule a follow-up appointment in: [place].  Any Other Special Instructions Will Be Listed Below (If Applicable).     If you need a refill on your cardiac medications before your next appointment, please call your pharmacy.     Hilbert Corrigan, [place]  06/27/2016 10:26 AM    [address], [place], [place]  [address] Phone: [phone]; Fax: [phone]

## 2016-06-27 NOTE — Telephone Encounter (Signed)
New message  Pt is returning call to Elly Modena  Please call back

## 2016-06-27 NOTE — Patient Instructions (Signed)
Medication Instructions:  Your physician recommends that you continue on your current medications as directed. Please refer to the Current Medication list given to you today.  Labwork: NONE   Testing/Procedures: NONE   Follow-Up: Your physician recommends that you schedule a follow-up appointment in: [place].  Any Other Special Instructions Will Be Listed Below (If Applicable).     If you need a refill on your cardiac medications before your next appointment, please call your pharmacy.

## 2016-06-27 NOTE — Telephone Encounter (Signed)
Cardiac clearance letter,06/23/16 myoview faxed to [facility] at [facility] hospital short stay at fax # [phone].

## 2016-06-28 ENCOUNTER — Telehealth: Payer: Self-pay | Admitting: Obstetrics & Gynecology

## 2016-06-28 NOTE — Telephone Encounter (Signed)
Patient called stating that needs to get back on the surgery schedule asap. She has received her cardiac clearance and is able to have the procedure, LAPAROSCOPIC ASSISTED VAGINAL HYSTERECTOMY WITH SALPINGO OOPHORECTOMY done. Patient stated that if there was any way things could be expedited, since she's already taken the time off and such, that would be great. Patient was notified that [place] Presnell is the surgery scheduler and would be in contact with her regarding her new surgery date. Please advise.

## 2016-07-01 NOTE — Patient Instructions (Addendum)
Your procedure is scheduled on: Tuesday, 11/21  Enter through the Main Entrance of [facility] at: 8:15 am  Pick up the phone at the desk and dial [phone].  Call this number if you have problems the morning of surgery: [phone].  Remember: Do NOT eat or drink (including water) after midnight Tonight  (Monday 11/20)  Take these medicines the morning of surgery with a SIP OF WATER: cymbalta, lasix,  Do NOT wear jewelry (body piercing), metal hair clips/bobby pins, make-up, or nail polish. Do NOT wear lotions, powders, or perfumes.  You may wear deoderant. Do NOT shave for 48 hours prior to surgery. Do NOT bring valuables to the hospital. Contacts, dentures, or bridgework may not be worn into surgery.  Stop ALL herbal medications at this time.  Leave suitcase in car.  After surgery it may be brought to your room.  For patients admitted to the hospital, checkout time is 11:00 AM the day of discharge.  Bring CPAP day of surgery

## 2016-07-04 ENCOUNTER — Telehealth: Payer: Self-pay | Admitting: Emergency Medicine

## 2016-07-04 ENCOUNTER — Encounter (HOSPITAL_COMMUNITY): Payer: Self-pay

## 2016-07-04 ENCOUNTER — Encounter (HOSPITAL_COMMUNITY)
Admission: RE | Admit: 2016-07-04 | Discharge: 2016-07-04 | Disposition: A | Discharge status: Home / Self Care | Payer: BLUE CROSS/BLUE SHIELD | Source: Ambulatory Visit | Attending: Obstetrics & Gynecology | Admitting: Obstetrics & Gynecology

## 2016-07-04 DIAGNOSIS — D270 Benign neoplasm of right ovary: Secondary | ICD-10-CM | POA: Diagnosis not present

## 2016-07-04 DIAGNOSIS — Z6838 Body mass index (BMI) 38.0-38.9, adult: Secondary | ICD-10-CM | POA: Diagnosis not present

## 2016-07-04 DIAGNOSIS — M1990 Unspecified osteoarthritis, unspecified site: Secondary | ICD-10-CM | POA: Diagnosis not present

## 2016-07-04 DIAGNOSIS — N9489 Other specified conditions associated with female genital organs and menstrual cycle: Secondary | ICD-10-CM | POA: Diagnosis not present

## 2016-07-04 DIAGNOSIS — R102 Pelvic and perineal pain: Secondary | ICD-10-CM | POA: Diagnosis present

## 2016-07-04 DIAGNOSIS — M797 Fibromyalgia: Secondary | ICD-10-CM | POA: Diagnosis not present

## 2016-07-04 DIAGNOSIS — N72 Inflammatory disease of cervix uteri: Secondary | ICD-10-CM | POA: Diagnosis not present

## 2016-07-04 DIAGNOSIS — D251 Intramural leiomyoma of uterus: Secondary | ICD-10-CM | POA: Diagnosis not present

## 2016-07-04 DIAGNOSIS — F329 Major depressive disorder, single episode, unspecified: Secondary | ICD-10-CM | POA: Diagnosis not present

## 2016-07-04 DIAGNOSIS — N888 Other specified noninflammatory disorders of cervix uteri: Secondary | ICD-10-CM | POA: Diagnosis not present

## 2016-07-04 DIAGNOSIS — G4730 Sleep apnea, unspecified: Secondary | ICD-10-CM | POA: Diagnosis not present

## 2016-07-04 DIAGNOSIS — Z888 Allergy status to other drugs, medicaments and biological substances status: Secondary | ICD-10-CM | POA: Diagnosis not present

## 2016-07-04 DIAGNOSIS — I4891 Unspecified atrial fibrillation: Secondary | ICD-10-CM | POA: Diagnosis not present

## 2016-07-04 DIAGNOSIS — N800 Endometriosis of uterus: Secondary | ICD-10-CM | POA: Diagnosis not present

## 2016-07-04 NOTE — Telephone Encounter (Signed)
I am sorry but I am currently not accepting new patients.

## 2016-07-04 NOTE — Telephone Encounter (Signed)
Patient wants to switch providers from [facility] to Dr Deborra Medina. Please advise

## 2016-07-04 NOTE — Telephone Encounter (Signed)
Patient wants to switch providers, is this ok

## 2016-07-04 NOTE — Telephone Encounter (Signed)
Would need to call [facility] to see who is accepting new patients if they prefer this location

## 2016-07-05 ENCOUNTER — Ambulatory Visit (HOSPITAL_COMMUNITY): Payer: BLUE CROSS/BLUE SHIELD | Admitting: Anesthesiology

## 2016-07-05 ENCOUNTER — Ambulatory Visit (HOSPITAL_COMMUNITY)
Admission: AD | Admit: 2016-07-05 | Discharge: 2016-07-06 | Disposition: A | Discharge status: Home / Self Care | Payer: BLUE CROSS/BLUE SHIELD | Source: Ambulatory Visit | Attending: Obstetrics & Gynecology | Admitting: Obstetrics & Gynecology

## 2016-07-05 ENCOUNTER — Encounter (HOSPITAL_COMMUNITY)
Admission: AD | Disposition: A | Discharge status: Home / Self Care | Payer: Self-pay | Source: Ambulatory Visit | Attending: Obstetrics & Gynecology

## 2016-07-05 ENCOUNTER — Encounter (HOSPITAL_COMMUNITY): Payer: Self-pay

## 2016-07-05 DIAGNOSIS — F329 Major depressive disorder, single episode, unspecified: Secondary | ICD-10-CM | POA: Insufficient documentation

## 2016-07-05 DIAGNOSIS — D251 Intramural leiomyoma of uterus: Secondary | ICD-10-CM | POA: Insufficient documentation

## 2016-07-05 DIAGNOSIS — I4891 Unspecified atrial fibrillation: Secondary | ICD-10-CM | POA: Insufficient documentation

## 2016-07-05 DIAGNOSIS — N9489 Other specified conditions associated with female genital organs and menstrual cycle: Secondary | ICD-10-CM | POA: Insufficient documentation

## 2016-07-05 DIAGNOSIS — Z9889 Other specified postprocedural states: Secondary | ICD-10-CM

## 2016-07-05 DIAGNOSIS — D270 Benign neoplasm of right ovary: Secondary | ICD-10-CM | POA: Diagnosis not present

## 2016-07-05 DIAGNOSIS — N888 Other specified noninflammatory disorders of cervix uteri: Secondary | ICD-10-CM | POA: Insufficient documentation

## 2016-07-05 DIAGNOSIS — M797 Fibromyalgia: Secondary | ICD-10-CM | POA: Insufficient documentation

## 2016-07-05 DIAGNOSIS — N800 Endometriosis of uterus: Secondary | ICD-10-CM | POA: Insufficient documentation

## 2016-07-05 DIAGNOSIS — Z90710 Acquired absence of both cervix and uterus: Secondary | ICD-10-CM | POA: Diagnosis present

## 2016-07-05 DIAGNOSIS — Z888 Allergy status to other drugs, medicaments and biological substances status: Secondary | ICD-10-CM | POA: Insufficient documentation

## 2016-07-05 DIAGNOSIS — Z6838 Body mass index (BMI) 38.0-38.9, adult: Secondary | ICD-10-CM | POA: Insufficient documentation

## 2016-07-05 DIAGNOSIS — N72 Inflammatory disease of cervix uteri: Secondary | ICD-10-CM | POA: Insufficient documentation

## 2016-07-05 DIAGNOSIS — G4730 Sleep apnea, unspecified: Secondary | ICD-10-CM | POA: Insufficient documentation

## 2016-07-05 DIAGNOSIS — M1990 Unspecified osteoarthritis, unspecified site: Secondary | ICD-10-CM | POA: Insufficient documentation

## 2016-07-05 HISTORY — PX: LAPAROSCOPIC VAGINAL HYSTERECTOMY WITH SALPINGO OOPHORECTOMY: SHX6681

## 2016-07-05 LAB — PREGNANCY, URINE: PREG TEST UR: NEGATIVE

## 2016-07-05 SURGERY — HYSTERECTOMY, VAGINAL, LAPAROSCOPY-ASSISTED, WITH SALPINGO-OOPHORECTOMY
Anesthesia: General | Site: Vagina | Laterality: Bilateral

## 2016-07-05 MED ORDER — HYDROMORPHONE HCL 1 MG/ML IJ SOLN
INTRAMUSCULAR | Status: AC
  Filled 2016-07-05: qty 1

## 2016-07-05 MED ORDER — LORATADINE-PSEUDOEPHEDRINE ER 10-240 MG PO TB24: 1.0000 | ORAL_TABLET | Freq: Every day | ORAL | Status: DC

## 2016-07-05 MED ORDER — DULOXETINE HCL 60 MG PO CPEP
60.0000 mg | ORAL_CAPSULE | Freq: Every day | ORAL | Status: DC
  Administered 2016-07-06: 60 mg via ORAL
  Filled 2016-07-05 (×2): qty 1

## 2016-07-05 MED ORDER — BUPIVACAINE-EPINEPHRINE (PF) 0.5% -1:200000 IJ SOLN
INTRAMUSCULAR | Status: AC
  Filled 2016-07-05: qty 30

## 2016-07-05 MED ORDER — LIDOCAINE HCL (CARDIAC) 20 MG/ML IV SOLN
INTRAVENOUS | Status: AC
  Filled 2016-07-05: qty 5

## 2016-07-05 MED ORDER — DEXAMETHASONE SODIUM PHOSPHATE 10 MG/ML IJ SOLN
INTRAMUSCULAR | Status: DC | PRN
  Administered 2016-07-05: 4 mg via INTRAVENOUS

## 2016-07-05 MED ORDER — MIDAZOLAM HCL 2 MG/2ML IJ SOLN
INTRAMUSCULAR | Status: AC
  Filled 2016-07-05: qty 2

## 2016-07-05 MED ORDER — MIDAZOLAM HCL 2 MG/2ML IJ SOLN
INTRAMUSCULAR | Status: DC | PRN
  Administered 2016-07-05: 1 mg via INTRAVENOUS

## 2016-07-05 MED ORDER — HYDROMORPHONE HCL 1 MG/ML IJ SOLN
0.2500 mg | INTRAMUSCULAR | Status: DC | PRN
  Administered 2016-07-05 (×4): 0.5 mg via INTRAVENOUS

## 2016-07-05 MED ORDER — BUPIVACAINE-EPINEPHRINE 0.5% -1:200000 IJ SOLN
INTRAMUSCULAR | Status: DC | PRN
  Administered 2016-07-05: 50 mL

## 2016-07-05 MED ORDER — LABETALOL HCL 5 MG/ML IV SOLN
INTRAVENOUS | Status: DC | PRN
  Administered 2016-07-05 (×4): 5 mg via INTRAVENOUS

## 2016-07-05 MED ORDER — LACTATED RINGERS IV SOLN
INTRAVENOUS | Status: DC
  Administered 2016-07-05: 14:00:00 via INTRAVENOUS
  Administered 2016-07-06: 100 mL/h via INTRAVENOUS

## 2016-07-05 MED ORDER — SUGAMMADEX SODIUM 200 MG/2ML IV SOLN
INTRAVENOUS | Status: DC | PRN
  Administered 2016-07-05: 225.4 mg via INTRAVENOUS

## 2016-07-05 MED ORDER — BUPIVACAINE HCL (PF) 0.5 % IJ SOLN
INTRAMUSCULAR | Status: AC
  Filled 2016-07-05: qty 30

## 2016-07-05 MED ORDER — KETOROLAC TROMETHAMINE 30 MG/ML IJ SOLN
INTRAMUSCULAR | Status: AC
  Filled 2016-07-05: qty 1

## 2016-07-05 MED ORDER — HYDROMORPHONE HCL 1 MG/ML IJ SOLN
0.2000 mg | INTRAMUSCULAR | Status: DC | PRN
  Administered 2016-07-05 (×2): 0.6 mg via INTRAVENOUS
  Filled 2016-07-05 (×2): qty 1

## 2016-07-05 MED ORDER — ONDANSETRON HCL 4 MG/2ML IJ SOLN: 4.0000 mg | Freq: Four times a day (QID) | INTRAMUSCULAR | Status: DC | PRN

## 2016-07-05 MED ORDER — ROCURONIUM BROMIDE 100 MG/10ML IV SOLN
INTRAVENOUS | Status: DC | PRN
  Administered 2016-07-05: 50 mg via INTRAVENOUS

## 2016-07-05 MED ORDER — SODIUM CHLORIDE 0.9 % IJ SOLN
INTRAMUSCULAR | Status: AC
  Filled 2016-07-05: qty 100

## 2016-07-05 MED ORDER — SUGAMMADEX SODIUM 200 MG/2ML IV SOLN
INTRAVENOUS | Status: AC
  Filled 2016-07-05: qty 2

## 2016-07-05 MED ORDER — LORATADINE 10 MG PO TABS
10.0000 mg | ORAL_TABLET | Freq: Every day | ORAL | Status: DC
  Administered 2016-07-05 – 2016-07-06 (×2): 10 mg via ORAL
  Filled 2016-07-05 (×3): qty 1

## 2016-07-05 MED ORDER — CEFAZOLIN SODIUM-DEXTROSE 2-3 GM-% IV SOLR
INTRAVENOUS | Status: DC | PRN
  Administered 2016-07-05: 2 g via INTRAVENOUS

## 2016-07-05 MED ORDER — DILTIAZEM HCL ER COATED BEADS 120 MG PO CP24
120.0000 mg | ORAL_CAPSULE | Freq: Every day | ORAL | Status: DC
  Filled 2016-07-05 (×2): qty 1

## 2016-07-05 MED ORDER — DILTIAZEM HCL 30 MG PO TABS: 30.0000 mg | ORAL_TABLET | Freq: Two times a day (BID) | ORAL | Status: DC

## 2016-07-05 MED ORDER — BUPIVACAINE HCL (PF) 0.5 % IJ SOLN
INTRAMUSCULAR | Status: DC | PRN
  Administered 2016-07-05: 5 mL

## 2016-07-05 MED ORDER — LACTATED RINGERS IV SOLN
INTRAVENOUS | Status: DC
  Administered 2016-07-05: 11:00:00 via INTRAVENOUS
  Administered 2016-07-05: 125 mL/h via INTRAVENOUS

## 2016-07-05 MED ORDER — LACTATED RINGERS IR SOLN
Status: DC | PRN
  Administered 2016-07-05: 3000 mL

## 2016-07-05 MED ORDER — SCOPOLAMINE 1 MG/3DAYS TD PT72
1.0000 | MEDICATED_PATCH | Freq: Once | TRANSDERMAL | Status: DC
  Administered 2016-07-05: 1.5 mg via TRANSDERMAL

## 2016-07-05 MED ORDER — FENTANYL CITRATE (PF) 100 MCG/2ML IJ SOLN
INTRAMUSCULAR | Status: AC
  Filled 2016-07-05: qty 2

## 2016-07-05 MED ORDER — FENTANYL CITRATE (PF) 100 MCG/2ML IJ SOLN
INTRAMUSCULAR | Status: DC | PRN
  Administered 2016-07-05: 50 ug via INTRAVENOUS
  Administered 2016-07-05: 25 ug via INTRAVENOUS
  Administered 2016-07-05: 50 ug via INTRAVENOUS
  Administered 2016-07-05: 100 ug via INTRAVENOUS
  Administered 2016-07-05 (×2): 50 ug via INTRAVENOUS
  Administered 2016-07-05: 25 ug via INTRAVENOUS

## 2016-07-05 MED ORDER — SCOPOLAMINE 1 MG/3DAYS TD PT72
MEDICATED_PATCH | TRANSDERMAL | Status: AC
  Filled 2016-07-05: qty 1

## 2016-07-05 MED ORDER — SUGAMMADEX SODIUM 500 MG/5ML IV SOLN
INTRAVENOUS | Status: AC
  Filled 2016-07-05: qty 5

## 2016-07-05 MED ORDER — ONDANSETRON HCL 4 MG/2ML IJ SOLN
INTRAMUSCULAR | Status: AC
  Filled 2016-07-05: qty 2

## 2016-07-05 MED ORDER — FENTANYL CITRATE (PF) 250 MCG/5ML IJ SOLN
INTRAMUSCULAR | Status: AC
  Filled 2016-07-05: qty 5

## 2016-07-05 MED ORDER — FUROSEMIDE 20 MG PO TABS
20.0000 mg | ORAL_TABLET | Freq: Every day | ORAL | Status: DC
Start: 2016-07-05 — End: 2016-07-06
  Administered 2016-07-05 – 2016-07-06 (×2): 20 mg via ORAL
  Filled 2016-07-05 (×3): qty 1

## 2016-07-05 MED ORDER — LIDOCAINE HCL (CARDIAC) 20 MG/ML IV SOLN
INTRAVENOUS | Status: DC | PRN
  Administered 2016-07-05: 30 mg via INTRAVENOUS
  Administered 2016-07-05: 70 mg via INTRAVENOUS

## 2016-07-05 MED ORDER — PROMETHAZINE HCL 25 MG/ML IJ SOLN: 6.2500 mg | INTRAMUSCULAR | Status: DC | PRN

## 2016-07-05 MED ORDER — KETOROLAC TROMETHAMINE 30 MG/ML IJ SOLN: 30.0000 mg | Freq: Once | INTRAMUSCULAR | Status: DC

## 2016-07-05 MED ORDER — ONDANSETRON HCL 4 MG PO TABS: 4.0000 mg | ORAL_TABLET | Freq: Four times a day (QID) | ORAL | Status: DC | PRN

## 2016-07-05 MED ORDER — ONDANSETRON HCL 4 MG/2ML IJ SOLN
INTRAMUSCULAR | Status: DC | PRN
  Administered 2016-07-05: 4 mg via INTRAVENOUS

## 2016-07-05 MED ORDER — IBUPROFEN 800 MG PO TABS
800.0000 mg | ORAL_TABLET | Freq: Three times a day (TID) | ORAL | Status: DC | PRN
  Administered 2016-07-05: 800 mg via ORAL
  Filled 2016-07-05: qty 1

## 2016-07-05 MED ORDER — PROPOFOL 10 MG/ML IV BOLUS
INTRAVENOUS | Status: DC | PRN
  Administered 2016-07-05: 17 mg via INTRAVENOUS

## 2016-07-05 MED ORDER — DEXAMETHASONE SODIUM PHOSPHATE 4 MG/ML IJ SOLN
INTRAMUSCULAR | Status: AC
  Filled 2016-07-05: qty 1

## 2016-07-05 MED ORDER — LABETALOL HCL 5 MG/ML IV SOLN
INTRAVENOUS | Status: AC
  Filled 2016-07-05: qty 4

## 2016-07-05 MED ORDER — PROPOFOL 10 MG/ML IV BOLUS
INTRAVENOUS | Status: AC
  Filled 2016-07-05: qty 20

## 2016-07-05 MED ORDER — MEPERIDINE HCL 25 MG/ML IJ SOLN: 6.2500 mg | INTRAMUSCULAR | Status: DC | PRN

## 2016-07-05 MED ORDER — OXYCODONE-ACETAMINOPHEN 5-325 MG PO TABS
1.0000 | ORAL_TABLET | ORAL | Status: DC | PRN
  Administered 2016-07-05: 2 via ORAL
  Administered 2016-07-05: 1 via ORAL
  Administered 2016-07-06: 2 via ORAL
  Filled 2016-07-05 (×2): qty 2
  Filled 2016-07-05: qty 1

## 2016-07-05 MED ORDER — SODIUM CHLORIDE 0.9 % IJ SOLN
INTRAMUSCULAR | Status: DC | PRN
  Administered 2016-07-05: 100 mL

## 2016-07-05 MED ORDER — PSEUDOEPHEDRINE HCL ER 120 MG PO TB12
120.0000 mg | ORAL_TABLET | Freq: Two times a day (BID) | ORAL | Status: DC
  Administered 2016-07-05 – 2016-07-06 (×3): 120 mg via ORAL
  Filled 2016-07-05 (×5): qty 1

## 2016-07-05 SURGICAL SUPPLY — 45 items
APL SRG 38 LTWT LNG FL B (MISCELLANEOUS) ×1
APPLICATOR ARISTA FLEXITIP XL (MISCELLANEOUS) ×1 IMPLANT
APPLICATOR COTTON TIP 6IN STRL (MISCELLANEOUS) ×2 IMPLANT
APPLIER CLIP 5 13 M/L LIGAMAX5 (MISCELLANEOUS) ×2
APR CLP MED LRG 5 ANG JAW (MISCELLANEOUS) ×1
CANISTER SUCT 3000ML (MISCELLANEOUS) ×2 IMPLANT
CLIP APPLIE 5 13 M/L LIGAMAX5 (MISCELLANEOUS) IMPLANT
CLOTH BEACON ORANGE TIMEOUT ST (SAFETY) ×2 IMPLANT
CONT PATH 16OZ SNAP LID 3702 (MISCELLANEOUS) ×2 IMPLANT
DECANTER SPIKE VIAL GLASS SM (MISCELLANEOUS) ×4 IMPLANT
DRSG OPSITE POSTOP 3X4 (GAUZE/BANDAGES/DRESSINGS) ×2 IMPLANT
DURAPREP 26ML APPLICATOR (WOUND CARE) ×2 IMPLANT
ELECT BLADE 6.5 EXT (BLADE) ×1 IMPLANT
ELECT REM PT RETURN 9FT ADLT (ELECTROSURGICAL) ×2
ELECTRODE REM PT RTRN 9FT ADLT (ELECTROSURGICAL) IMPLANT
GLOVE BIO SURGEON STRL SZ 6.5 (GLOVE) ×4 IMPLANT
GLOVE BIOGEL PI IND STRL 7.0 (GLOVE) ×3 IMPLANT
GLOVE BIOGEL PI INDICATOR 7.0 (GLOVE) ×3
HEMOSTAT ARISTA ABSORB 3G PWDR (MISCELLANEOUS) ×2 IMPLANT
LEGGING LITHOTOMY PAIR STRL (DRAPES) ×2 IMPLANT
NDL SAFETY ECLIPSE 18X1.5 (NEEDLE) ×1 IMPLANT
NDL SPNL 18GX3.5 QUINCKE PK (NEEDLE) ×1 IMPLANT
NEEDLE HYPO 18GX1.5 SHARP (NEEDLE) ×2
NEEDLE INSUFFLATION 120MM (ENDOMECHANICALS) ×2 IMPLANT
NEEDLE SPNL 18GX3.5 QUINCKE PK (NEEDLE) ×2 IMPLANT
NS IRRIG 1000ML POUR BTL (IV SOLUTION) ×2 IMPLANT
PACK LAVH (CUSTOM PROCEDURE TRAY) ×2 IMPLANT
PACK ROBOTIC GOWN (GOWN DISPOSABLE) ×2 IMPLANT
PACK TRENDGUARD 600 HYBRD PROC (MISCELLANEOUS) IMPLANT
PROTECTOR NERVE ULNAR (MISCELLANEOUS) ×4 IMPLANT
SET IRRIG TUBING LAPAROSCOPIC (IRRIGATION / IRRIGATOR) ×1 IMPLANT
SHEARS HARMONIC ACE PLUS 36CM (ENDOMECHANICALS) ×1 IMPLANT
SLEEVE XCEL OPT CAN 5 100 (ENDOMECHANICALS) ×2 IMPLANT
SUT VIC AB 0 CT1 36 (SUTURE) ×2 IMPLANT
SUT VIC AB 2-0 CT1 18 (SUTURE) ×6 IMPLANT
SUT VICRYL 0 UR6 27IN ABS (SUTURE) ×4 IMPLANT
SUT VICRYL 4-0 PS2 18IN ABS (SUTURE) ×5 IMPLANT
SYR 30ML LL (SYRINGE) ×2 IMPLANT
SYR BULB IRRIGATION 50ML (SYRINGE) ×2 IMPLANT
TOWEL OR 17X24 6PK STRL BLUE (TOWEL DISPOSABLE) ×6 IMPLANT
TRAY FOLEY CATH SILVER 14FR (SET/KITS/TRAYS/PACK) ×2 IMPLANT
TRENDGUARD 600 HYBRID PROC PK (MISCELLANEOUS) ×2
TROCAR OPTI TIP 5M 100M (ENDOMECHANICALS) ×2 IMPLANT
TROCAR XCEL DIL TIP R 11M (ENDOMECHANICALS) ×2 IMPLANT
WARMER LAPAROSCOPE (MISCELLANEOUS) ×2 IMPLANT

## 2016-07-05 NOTE — Anesthesia Postprocedure Evaluation (Signed)
Anesthesia Post Note  Patient: FAYA VERONICA  Procedure(s) Performed: Procedure(s) (LRB): LAPAROSCOPIC ASSISTED VAGINAL HYSTERECTOMY WITH SALPINGO OOPHORECTOMY (Bilateral)  Patient location during evaluation: PACU Anesthesia Type: General Level of consciousness: awake and sedated Pain management: pain level controlled Vital Signs Assessment: post-procedure vital signs reviewed and stable Respiratory status: spontaneous breathing Cardiovascular status: stable Postop Assessment: no signs of nausea or vomiting Anesthetic complications: no     Last Vitals:  Vitals:   07/05/16 1315 07/05/16 1330  BP: 123/78 111/72  Pulse: (!) 58 72  Resp: 10 12  Temp:  36.6 C    Last Pain:  Vitals:   07/05/16 1330  TempSrc:   PainSc: 3    Pain Goal: Patients Stated Pain Goal: 3 (07/05/16 1330)               Nilda Simmer

## 2016-07-05 NOTE — Anesthesia Procedure Notes (Signed)
Procedure Name: Intubation Date/Time: 07/05/2016 10:06 AM Performed by: Tobin Chad Pre-anesthesia Checklist: Patient identified, Emergency Drugs available, Suction available, Patient being monitored and Timeout performed Patient Re-evaluated:Patient Re-evaluated prior to inductionOxygen Delivery Method: Simple face mask and Circle system utilized Preoxygenation: Pre-oxygenation with 100% oxygen Intubation Type: IV induction Ventilation: Mask ventilation without difficulty Laryngoscope Size: Mac and 3 Grade View: Grade II Tube type: Oral Tube size: 7.0 mm Number of attempts: 1 Airway Equipment and Method: Stylet Placement Confirmation: ETT inserted through vocal cords under direct vision,  positive ETCO2 and breath sounds checked- equal and bilateral Secured at: 22 cm Tube secured with: Tape Dental Injury: Teeth and Oropharynx as per pre-operative assessment

## 2016-07-05 NOTE — Anesthesia Preprocedure Evaluation (Signed)
Anesthesia Evaluation  Patient identified by MRN, date of birth, ID band Patient awake    Reviewed: Allergy & Precautions, H&P , NPO status , Patient's Chart, lab work & pertinent test results  Airway Mallampati: I  TM Distance: >3 FB Neck ROM: full    Dental no notable dental hx. (+) Teeth Intact   Pulmonary    Pulmonary exam normal        Cardiovascular Normal cardiovascular exam     Neuro/Psych    GI/Hepatic negative GI ROS, Neg liver ROS,   Endo/Other  Morbid obesity  Renal/GU negative Renal ROS     Musculoskeletal   Abdominal (+) + obese,   Peds  Hematology   Anesthesia Other Findings   Reproductive/Obstetrics negative OB ROS                             Anesthesia Physical Anesthesia Plan  ASA: III  Anesthesia Plan: General   Post-op Pain Management:    Induction: Intravenous  Airway Management Planned: Oral ETT  Additional Equipment:   Intra-op Plan:   Post-operative Plan: Extubation in OR  Informed Consent: I have reviewed the patients History and Physical, chart, labs and discussed the procedure including the risks, benefits and alternatives for the proposed anesthesia with the patient or authorized representative who has indicated his/her understanding and acceptance.   Dental Advisory Given  Plan Discussed with: CRNA and Surgeon  Anesthesia Plan Comments:         Anesthesia Quick Evaluation

## 2016-07-05 NOTE — Anesthesia Postprocedure Evaluation (Signed)
Anesthesia Post Note  Patient: Alexandra Mata  Procedure(s) Performed: Procedure(s) (LRB): LAPAROSCOPIC ASSISTED VAGINAL HYSTERECTOMY WITH SALPINGO OOPHORECTOMY (Bilateral)  Patient location during evaluation: [facility] Anesthesia Type: General Level of consciousness: awake and alert Pain management: pain level controlled Vital Signs Assessment: post-procedure vital signs reviewed and stable Respiratory status: spontaneous breathing, nonlabored ventilation, respiratory function stable and patient connected to nasal cannula oxygen Cardiovascular status: blood pressure returned to baseline and stable Postop Assessment: no signs of nausea or vomiting Anesthetic complications: no     Last Vitals:  Vitals:   07/05/16 1330 07/05/16 1345  BP: 111/72 119/61  Pulse: 72 65  Resp: 12 14  Temp: 36.6 C 36.9 C    Last Pain:  Vitals:   07/05/16 1443  TempSrc:   PainSc: 2    Pain Goal: Patients Stated Pain Goal: 3 (07/05/16 1443)               Rayvon Char

## 2016-07-05 NOTE — Op Note (Addendum)
07/05/2016  11:55 AM  PATIENT:  Alexandra Mata  50 y.o. female  PRE-OPERATIVE DIAGNOSIS:  Pelvic pain, fibroid, ovarian cyst  POST-OPERATIVE DIAGNOSIS:  Pelvic pain, fibroid, ovarian cyst  PROCEDURE:  Procedure(s): LAPAROSCOPIC ASSISTED VAGINAL HYSTERECTOMY WITH SALPINGO OOPHORECTOMY (Bilateral)  SURGEON:  Surgeon(s) and Role:    * [name], MD - Primary  ASSISTANTS: Trellis Moment, RNFA, Court Joy, MD, Don Perking, MS4   ANESTHESIA:   general  EBL:  Total I/O In: 1000 [I.V.:1000] Out: 68 [Urine:60; Blood:550]  BLOOD ADMINISTERED:none  DRAINS: none   LOCAL MEDICATIONS USED:  MARCAINE     SPECIMEN:  Source of Specimen:  uterus, tubes, and ovaries  DISPOSITION OF SPECIMEN:  PATHOLOGY  COUNTS:  YES  TOURNIQUET:  * No tourniquets in log *  DICTATION: .Dragon Dictation  PLAN OF CARE: Admit to inpatient   PATIENT DISPOSITION:  PACU - hemodynamically stable.   Delay start of Pharmacological VTE agent (>24hrs) due to surgical blood loss or risk of bleeding: n/a   The risks, benefits, and alternatives of surgery were explained, accepted, and understood. Consents were signed. She was taken to the operating room and placed in the dorsal lithotomy position. When she was comfortable, general anesthesia was applied without complication. Her abdomen and vagina were prepped and draped in the usual sterile fashion. A Hulka manipulator was placed. A Foley catheter was placed and a drained clear urine throughout the case. Gloves were changed and attention was turned to the abdomen. 0.5% Marcaine was used to inject at all the incision sites prior to making an incision. A 10 mm incision was made in the umbilicus. A Veress needle was placed intraperitoneally. Low-flow CO2 was used to insufflate the abdomen to approximately 3 1/2 L. After an excellent pneumoperitoneum was established a 10 mm trocar was placed. Laparoscopy confirmed correct placement. Under direct  laparoscopic visualization I placed a 5 mm port in each lower quadrant. She was placed in the Trendelenburg position. The pelvis was inspected. Both adnexa were very small with the left ovary having a large paraovarian cyst. Her uterus was enlarged with a large fundal fibroid.  We used a harmonic scalpel to ligate the uteroovarian ligaments on each side. The round ligaments were ligated with the harmonic scalpel as well. I then moved to the vaginal approach. I used a solution of 30cc of 0.5% marcaine with 100 cc of normal saline to hydrodisect at the cervicovaginal junction. I made a circumferential incision at the site. The posterior peritoneum was entered sharply and a long weighted speculum was placed.  2-0 vicryl suture is used throughout unless otherwise specified.  I entered the anterior peritoneum as well. I then continued to separate the uterus from its pelvic attachments. The uterus, tubes, and ovaries were removed en bloc. Excellent hemostasis was maintained throughout. I closed the vaginal cuff in a transverse fashion with a 0 vicryl suture in a running, locking fashion. Gloves were changed and attention was turned back to the abdomen. The pelvis was inspected and noted to be hemostatic. There was some bleeding from the right IP ligament and hemostasis was obtained with a surgical clip. Her ureters were noted to be functioning normally and were of normal caliber. Arista was placed over the cuff and pedicles. The CO2 was allowed to escape from the abdomen. The fascia at the umbilicus was closed with a 0 Vicryl figure-of-eight suture. No defects were palpable. A subcuticular closure was done with 4-0 Vicryl at all of the skin incisions. Steri-Strips  were placed across the incision. She was extubated and taken to the recovery room in stable condition.

## 2016-07-05 NOTE — H&P (Signed)
Alexandra Mata is an 50 y.o. MWP3 here because she is tired of her pelvic pain. We discover a 6.7 cm fibroid and a simple right ovarian cyst on u/s about 6 months ago. Pain is unchanged. "almost like menstrual cramping" daily. LMP about 3 years ago. + hot flashes. Uses lube with sex. Occasional mood swings since menopause. Using cymbalta, but no estrogen   Patient's last menstrual period was 04/15/2013.    Past Medical History:  Diagnosis Date  . Anemia 05/17/2011  . Asymptomatic varicose veins   . Atrial fibrillation ([place])   . Bronchitis    several times  . Depression   . Edema   . Fibromyalgia   . Headache   . History of chicken pox   . Obesity   . Osteoarthritis   . Palpitations    improved with cutting out caffeine  . Pneumonia   . PONV (postoperative nausea and vomiting)   . Seizures ([place])    Pt states she had a couple of seizures in high school of undetermined cause-last 27 years ago--  . Sleep apnea   . Unspecified sinusitis (chronic)   . Varicose vein of leg     Past Surgical History:  Procedure Laterality Date  . ABLATION SAPHENOUS VEIN W/ RFA     3 yrs ago- bilateral  . COLONOSCOPY    . TUBAL LIGATION  1991   `  . VARICOSE VEIN SURGERY      Family History  Problem Relation Age of Onset  . Arthritis Mother   . Hypertension Mother   . Diabetes Mother   . Asthma Mother   . Hypertension Father   . Thyroid cancer Father   . Cancer Paternal Grandfather     lung  . Diabetes Maternal Grandmother   . Hypertension Maternal Grandmother   . Heart disease Maternal Grandmother   . Diabetes Paternal Grandmother   . Coronary artery disease Brother 67    MI  . Colon cancer Neg Hx   . Colon polyps Neg Hx     Social History:  reports that she has never smoked. She has never used smokeless tobacco. She reports that she does not drink alcohol or use drugs.  Allergies:  Allergies  Allergen Reactions  . Adhesive [Tape] Swelling and Rash    Blisters, swelling &  burn of skin [Teflon tape at Sleep Study]    Prescriptions Prior to Admission  Medication Sig Dispense Refill Last Dose  . diltiazem (CARDIZEM CD) 120 MG 24 hr capsule Take 1 capsule (120 mg total) by mouth daily. 30 capsule 5 07/04/2016 at 2300  . diltiazem (CARDIZEM) 30 MG tablet Take one tablet by mouth every 4 hours AS NEEDED for A-fib HR > 100 as long as BP > 100. 30 tablet 2 Past Month at Unknown time  . DULoxetine (CYMBALTA) 60 MG capsule Take 1 capsule (60 mg total) by mouth daily. 30 capsule 1 07/05/2016 at 0700  . furosemide (LASIX) 20 MG tablet Take 1 tablet (20 mg total) by mouth daily. 30 tablet 0 07/04/2016 at 0700  . ibuprofen (ADVIL,MOTRIN) 200 MG tablet Take 200 mg by mouth every 6 (six) hours as needed for moderate pain.    Past Week at Unknown time  . loratadine-pseudoephedrine (CLARITIN-D 24 HOUR) 10-240 MG 24 hr tablet Take 1 tablet by mouth daily. 60 tablet 0 07/04/2016 at Unknown time  . traMADol (ULTRAM) 50 MG tablet Take 50 mg by mouth every 6 (six) hours as needed for moderate  pain.   Past Month at Unknown time    ROS  Works as a Radio broadcast assistant. Married for 21 years  Blood pressure (!) 128/92, pulse 73, temperature 98 F (36.7 C), temperature source Oral, resp. rate 20, last menstrual period 04/15/2013, SpO2 99 %. Physical Exam  Heart- rrr Lungs- CTAB Abd- benign  Results for orders placed or performed during the hospital encounter of 07/05/16 (from the past 24 hour(s))  Pregnancy, urine     Status: None   Collection Time: 07/05/16  8:10 AM  Result Value Ref Range   Preg Test, Ur NEGATIVE NEGATIVE    No results found.  Assessment/Plan: Symptomatic fibroid, chronic pelvic pain, ovarian cyst- plan for LAVH/BSO.  She understands the risks of surgery, including, but not to infection, bleeding, DVTs, damage to bowel, bladder, ureters. She wishes to proceed.     Alexandra Mata 07/05/2016, 9:19 AM

## 2016-07-05 NOTE — Addendum Note (Signed)
Addendum  created 07/05/16 1625 by Rayvon Char, CRNA   Sign clinical note

## 2016-07-05 NOTE — Transfer of Care (Signed)
Immediate Anesthesia Transfer of Care Note  Patient: Alexandra Mata  Procedure(s) Performed: Procedure(s): LAPAROSCOPIC ASSISTED VAGINAL HYSTERECTOMY WITH SALPINGO OOPHORECTOMY (Bilateral)  Patient Location: PACU  Anesthesia Type:General  Level of Consciousness: awake, alert , oriented and patient cooperative  Airway & Oxygen Therapy: Patient Spontanous Breathing and Patient connected to nasal cannula oxygen  Post-op Assessment: Report given to RN and Post -op Vital signs reviewed and stable  Post vital signs: Reviewed and stable  Last Vitals:  Vitals:   07/05/16 0828 07/05/16 1230  BP: (!) 128/92 139/78  Pulse: 73 70  Resp: 20 18  Temp: 36.7 C 37.1 C    Last Pain:  Vitals:   07/05/16 0828  TempSrc: Oral      Patients Stated Pain Goal: 3 ([id number])  Complications: No apparent anesthesia complications

## 2016-07-06 ENCOUNTER — Encounter (HOSPITAL_COMMUNITY): Payer: Self-pay | Admitting: Obstetrics & Gynecology

## 2016-07-06 DIAGNOSIS — M797 Fibromyalgia: Secondary | ICD-10-CM

## 2016-07-06 DIAGNOSIS — N9489 Other specified conditions associated with female genital organs and menstrual cycle: Secondary | ICD-10-CM | POA: Diagnosis not present

## 2016-07-06 DIAGNOSIS — N800 Endometriosis of uterus: Secondary | ICD-10-CM | POA: Diagnosis not present

## 2016-07-06 DIAGNOSIS — D251 Intramural leiomyoma of uterus: Secondary | ICD-10-CM

## 2016-07-06 DIAGNOSIS — N72 Inflammatory disease of cervix uteri: Secondary | ICD-10-CM

## 2016-07-06 DIAGNOSIS — D270 Benign neoplasm of right ovary: Secondary | ICD-10-CM

## 2016-07-06 DIAGNOSIS — Z90710 Acquired absence of both cervix and uterus: Secondary | ICD-10-CM | POA: Diagnosis present

## 2016-07-06 LAB — CBC
HCT: 26.5 % — ABNORMAL LOW (ref 36.0–46.0)
Hemoglobin: 8.6 g/dL — ABNORMAL LOW (ref 12.0–15.0)
MCH: 29.3 pg (ref 26.0–34.0)
MCHC: 32.5 g/dL (ref 30.0–36.0)
MCV: 90.1 fL (ref 78.0–100.0)
PLATELETS: 212 10*3/uL (ref 150–400)
RBC: 2.94 MIL/uL — AB (ref 3.87–5.11)
RDW: 13.9 % (ref 11.5–15.5)
WBC: 14.4 10*3/uL — AB (ref 4.0–10.5)

## 2016-07-06 MED ORDER — IBUPROFEN 600 MG PO TABS: 600.0000 mg | ORAL_TABLET | Freq: Four times a day (QID) | ORAL | 1 refills | Status: DC | PRN

## 2016-07-06 MED ORDER — OXYCODONE-ACETAMINOPHEN 5-325 MG PO TABS: 1.0000 | ORAL_TABLET | ORAL | 0 refills | Status: DC | PRN

## 2016-07-06 NOTE — Discharge Instructions (Signed)
Abdominal Hysterectomy, Care After °These instructions give you information on caring for yourself after your procedure. Your doctor may also give you more specific instructions. Call your doctor if you have any problems or questions after your procedure. °Follow these instructions at home: °It takes 4-6 weeks to recover from this surgery. Follow all of your doctor's instructions. °· Only take medicines as told by your doctor. °· Change your bandage as told by your doctor. °· Return to your doctor to have your stitches taken out. °· Take showers for 2-3 weeks. Ask your doctor when it is okay to shower. °· Do not douche, use tampons, or have sex (intercourse) for at least 6 weeks or as told. °· Follow your doctor's advice about exercise, lifting objects, driving, and general activities. °· Get plenty of rest and sleep. °· Do not lift anything heavier than a gallon of milk (about 10 pounds [4.5 kilograms]) for the first month after surgery. °· Get back to your normal diet as told by your doctor. °· Do not drink alcohol until your doctor says it is okay. °· Take a medicine to help you poop (laxative) as told by your doctor. °· Eating foods high in fiber may help you poop. Eat a lot of raw fruits and vegetables, whole grains, and beans. °· Drink enough fluids to keep your pee (urine) clear or pale yellow. °· Have someone help you at home for 1-2 weeks after your surgery. °· Keep follow-up doctor visits as told. °Contact a doctor if: °· You have chills or fever. °· You have puffiness, redness, or pain in area of the cut (incision). °· You have yellowish-white fluid (pus) coming from the cut. °· You have a bad smell coming from the cut or bandage. °· Your cut pulls apart. °· You feel dizzy or light-headed. °· You have pain or bleeding when you pee. °· You keep having watery poop (diarrhea). °· You keep feeling sick to your stomach (nauseous) or keep throwing up (vomiting). °· You have fluid (discharge) coming from your  vagina. °· You have a rash. °· You have a reaction to your medicine. °· You need stronger pain medicine. °Get help right away if: °· You have a fever and your symptoms suddenly get worse. °· You have bad belly (abdominal) pain. °· You have chest pain. °· You are short of breath. °· You pass out (faint). °· You have pain, puffiness, or redness of your leg. °· You bleed a lot from your vagina and notice clumps of tissue (clots). °This information is not intended to replace advice given to you by your health care provider. Make sure you discuss any questions you have with your health care provider. °Document Released: 05/10/2008 Document Revised: 01/07/2016 Document Reviewed: 05/24/2013 °Elsevier Interactive Patient Education © 2017 [employer] ° °

## 2016-07-06 NOTE — Progress Notes (Signed)
Discharge teaching complete. Pt understood all information and did not have any questions. Pt ambulated out of the hospital and discharged home to family.  

## 2016-07-06 NOTE — Discharge Summary (Signed)
Physician Discharge Summary  Patient ID: Alexandra Mata MRN: [id number] DOB/AGE: 1965/11/10 50 y.o.  Admit date: 07/05/2016 Discharge date: 07/06/2016  Admission Diagnoses: symptomatic fibroids  Discharge Diagnoses: same + anemia Active Problems:   Post-operative state   S/P laparoscopic assisted vaginal hysterectomy (LAVH) BSO Discharged Condition: good  Hospital Course: She underwent an uncomplicated LAVH/BSO. Post operatively she did very well, ambulating, passing flatus, voiding, and tolerating po without nausea/vomitting. She voiced her readiness to go home on POD #1.  Consults: None  Significant Diagnostic Studies: labs: postop hbg 8.6  Treatments: surgery: as above  Discharge Exam: Blood pressure 137/61, pulse 94, temperature 98.7 F (37.1 C), temperature source Oral, resp. rate 18, height 5\' 7"  (1.702 m), weight 112.5 kg (248 lb), last menstrual period 04/15/2013, SpO2 98 %. General appearance: alert Resp: clear to auscultation bilaterally Cardio: regular rate and rhythm, S1, S2 normal, no murmur, click, rub or gallop GI: soft, non-tender; bowel sounds normal; no masses,  no organomegaly Incision/Wound: c/d/i/no erythema  Disposition: 01-Home or Self Care     Medication List    STOP taking these medications   diltiazem 30 MG tablet Commonly known as:  CARDIZEM     TAKE these medications   diltiazem 120 MG 24 hr capsule Commonly known as:  CARDIZEM CD Take 1 capsule (120 mg total) by mouth daily.   DULoxetine 60 MG capsule Commonly known as:  CYMBALTA Take 1 capsule (60 mg total) by mouth daily.   furosemide 20 MG tablet Commonly known as:  LASIX Take 1 tablet (20 mg total) by mouth daily.   ibuprofen 600 MG tablet Commonly known as:  ADVIL,MOTRIN Take 1 tablet (600 mg total) by mouth every 6 (six) hours as needed. What changed:  medication strength  how much to take  reasons to take this   loratadine-pseudoephedrine 10-240 MG 24 hr  tablet Commonly known as:  CLARITIN-D 24 HOUR Take 1 tablet by mouth daily.   oxyCODONE-acetaminophen 5-325 MG tablet Commonly known as:  PERCOCET/ROXICET Take 1-2 tablets by mouth every 4 (four) hours as needed for severe pain (moderate to severe pain (when tolerating fluids)).   traMADol 50 MG tablet Commonly known as:  ULTRAM Take 50 mg by mouth every 6 (six) hours as needed for moderate pain.      Follow-up Information    [name], MD. Schedule an appointment as soon as possible for a visit in 6 week(s).   Specialty:  Obstetrics and Gynecology Contact information: [place] [address] [phone]           Signed: [name] 07/06/2016, 1:28 PM

## 2016-07-10 ENCOUNTER — Other Ambulatory Visit: Payer: Self-pay | Admitting: Physician Assistant

## 2016-07-12 NOTE — Telephone Encounter (Signed)
Pt states that [place] is to far to travel and that she will call [facility] to see a new provider there.

## 2016-07-12 NOTE — Telephone Encounter (Signed)
Please have this patient come in to see Einar Pheasant  Before further refills can be given.

## 2016-07-15 ENCOUNTER — Ambulatory Visit: Payer: BLUE CROSS/BLUE SHIELD | Admitting: Physician Assistant

## 2016-07-15 ENCOUNTER — Other Ambulatory Visit: Payer: Self-pay | Admitting: Physician Assistant

## 2016-07-15 NOTE — Telephone Encounter (Signed)
Refill sent per [facility] refill protocol/SLS 12/01

## 2016-07-21 ENCOUNTER — Ambulatory Visit
Admission: RE | Admit: 2016-07-21 | Discharge: 2016-07-21 | Disposition: A | Discharge status: Home / Self Care | Payer: BLUE CROSS/BLUE SHIELD | Source: Ambulatory Visit | Attending: Obstetrics & Gynecology | Admitting: Obstetrics & Gynecology

## 2016-07-21 DIAGNOSIS — Z0000 Encounter for general adult medical examination without abnormal findings: Secondary | ICD-10-CM

## 2016-08-10 ENCOUNTER — Inpatient Hospital Stay (HOSPITAL_COMMUNITY)
Admission: RE | Admit: 2016-08-10 | Payer: BLUE CROSS/BLUE SHIELD | Source: Ambulatory Visit | Admitting: Nurse Practitioner

## 2016-08-23 ENCOUNTER — Ambulatory Visit
Admission: RE | Admit: 2016-08-23 | Discharge: 2016-08-23 | Disposition: A | Discharge status: Home / Self Care | Payer: BLUE CROSS/BLUE SHIELD | Source: Ambulatory Visit | Attending: Family Medicine | Admitting: Family Medicine

## 2016-08-23 ENCOUNTER — Other Ambulatory Visit: Payer: Self-pay | Admitting: Family Medicine

## 2016-08-23 DIAGNOSIS — R1030 Lower abdominal pain, unspecified: Secondary | ICD-10-CM

## 2016-08-25 ENCOUNTER — Other Ambulatory Visit: Payer: Self-pay | Admitting: Family Medicine

## 2016-08-25 DIAGNOSIS — M549 Dorsalgia, unspecified: Secondary | ICD-10-CM

## 2016-08-25 DIAGNOSIS — R944 Abnormal results of kidney function studies: Secondary | ICD-10-CM

## 2016-08-25 DIAGNOSIS — N12 Tubulo-interstitial nephritis, not specified as acute or chronic: Secondary | ICD-10-CM

## 2016-08-26 ENCOUNTER — Other Ambulatory Visit: Payer: BLUE CROSS/BLUE SHIELD

## 2016-08-28 ENCOUNTER — Other Ambulatory Visit: Payer: Self-pay | Admitting: Physician Assistant

## 2016-08-29 NOTE — Telephone Encounter (Signed)
Needs appointment for further fills.  30-day of her Cymbalta sent in. Again, no further refills.

## 2016-09-02 NOTE — Telephone Encounter (Signed)
Spoke with patient advised she needed an appointment to be seen for more refills. Patient states she will call back to schedule an appointment.

## 2016-09-14 ENCOUNTER — Other Ambulatory Visit: Payer: Self-pay | Admitting: Physician Assistant

## 2016-09-14 NOTE — Telephone Encounter (Signed)
Last OV 04/04/16, 06/16/16 E visit Patient transferring PCP but has not establish Last filled 07/15/16

## 2016-09-20 ENCOUNTER — Encounter (HOSPITAL_BASED_OUTPATIENT_CLINIC_OR_DEPARTMENT_OTHER): Payer: Self-pay

## 2016-09-20 ENCOUNTER — Emergency Department (HOSPITAL_BASED_OUTPATIENT_CLINIC_OR_DEPARTMENT_OTHER): Payer: BLUE CROSS/BLUE SHIELD

## 2016-09-20 ENCOUNTER — Emergency Department (HOSPITAL_BASED_OUTPATIENT_CLINIC_OR_DEPARTMENT_OTHER)
Admission: EM | Admit: 2016-09-20 | Discharge: 2016-09-20 | Disposition: A | Discharge status: Home / Self Care | Payer: BLUE CROSS/BLUE SHIELD | Attending: Emergency Medicine | Admitting: Emergency Medicine

## 2016-09-20 DIAGNOSIS — Y929 Unspecified place or not applicable: Secondary | ICD-10-CM | POA: Insufficient documentation

## 2016-09-20 DIAGNOSIS — Y9389 Activity, other specified: Secondary | ICD-10-CM | POA: Diagnosis not present

## 2016-09-20 DIAGNOSIS — Y999 Unspecified external cause status: Secondary | ICD-10-CM | POA: Diagnosis not present

## 2016-09-20 DIAGNOSIS — S52125A Nondisplaced fracture of head of left radius, initial encounter for closed fracture: Secondary | ICD-10-CM

## 2016-09-20 DIAGNOSIS — X501XXA Overexertion from prolonged static or awkward postures, initial encounter: Secondary | ICD-10-CM | POA: Diagnosis not present

## 2016-09-20 DIAGNOSIS — S6992XA Unspecified injury of left wrist, hand and finger(s), initial encounter: Secondary | ICD-10-CM | POA: Diagnosis present

## 2016-09-20 MED ORDER — HYDROCODONE-ACETAMINOPHEN 5-325 MG PO TABS: 1.0000 | ORAL_TABLET | Freq: Four times a day (QID) | ORAL | 0 refills | Status: DC | PRN

## 2016-09-20 MED ORDER — IBUPROFEN 800 MG PO TABS
800.0000 mg | ORAL_TABLET | Freq: Once | ORAL | Status: AC
  Administered 2016-09-20: 800 mg via ORAL
  Filled 2016-09-20: qty 1

## 2016-09-20 NOTE — ED Provider Notes (Signed)
[facility] DEPT [facility] Provider Note   CSN: [id number] Arrival date & time: 09/20/16  2027  By signing my name below, I, Jeanell Sparrow, attest that this documentation has been prepared under the direction and in the presence of Drenda Freeze, MD. Electronically Signed: Jeanell Sparrow, Scribe. 09/20/2016. 10:42 PM.  History   Chief Complaint Chief Complaint  Patient presents with  . Arm Injury   The history is provided by the patient. No language interpreter was used.   HPI Comments: Alexandra Mata is a 51 y.o. female with a PMHx of osteoarthritis who presents to the Emergency Department complaining of constant moderate LUE pain that started about an hour ago. She states she strained herself while pushing a car and "heard 3 distinct pops". She fell to her knees from momentum. No head injury. Her pain is exacerbated by LUE movement. She denies any hx of elbow dislocation or other complaints.     PCP: Leeanne Rio, PA-C  Past Medical History:  Diagnosis Date  . Anemia 05/17/2011  . Asymptomatic varicose veins   . Atrial fibrillation ([place])   . Bronchitis    several times  . Depression   . Edema   . Fibromyalgia   . Headache   . History of chicken pox   . Obesity   . Osteoarthritis   . Palpitations    improved with cutting out caffeine  . Pneumonia   . PONV (postoperative nausea and vomiting)   . Seizures ([place])    Pt states she had a couple of seizures in high school of undetermined cause-last 27 years ago--  . Sleep apnea   . Unspecified sinusitis (chronic)   . Varicose vein of leg     Patient Active Problem List   Diagnosis Date Noted  . S/P laparoscopic assisted vaginal hysterectomy (LAVH) 07/06/2016  . Post-operative state 07/05/2016  . PAF (paroxysmal atrial fibrillation) ([place]) 06/08/2016  . Viral URI 11/10/2015  . Dyspareunia, female 10/27/2015  . Pericarditis 05/12/2015  . Peripheral edema 01/14/2015  . Wheezing 08/11/2014  . Viral syndrome  07/16/2014  . Myalgia and myositis 07/16/2014  . Need for prophylactic vaccination and inoculation against influenza 06/30/2014  . Allergic rhinitis 05/16/2014  . Shingles 04/07/2014  . Obesity, unspecified 04/07/2014  . Recurrent sinusitis 10/24/2013  . Hot flashes, menopausal 07/25/2013  . Neck pain on right side 06/18/2013  . Low back pain potentially associated with radiculopathy 04/22/2013  . Suspected DVT (deep vein thrombosis) ([place]) 04/03/2013  . Leg edema 02/02/2012  . Varicose veins of lower extremities with other complications 123XX123  . Amenorrhea 07/13/2011  . Anemia 05/17/2011  . Varicose veins of leg with edema 03/07/2011  . Obesity 03/07/2011    Past Surgical History:  Procedure Laterality Date  . ABDOMINAL HYSTERECTOMY    . ABLATION SAPHENOUS VEIN W/ RFA     3 yrs ago- bilateral  . COLONOSCOPY    . LAPAROSCOPIC VAGINAL HYSTERECTOMY WITH SALPINGO OOPHORECTOMY Bilateral 07/05/2016   Procedure: LAPAROSCOPIC ASSISTED VAGINAL HYSTERECTOMY WITH SALPINGO OOPHORECTOMY;  Surgeon: [name], MD;  Location: [facility];  Service: Gynecology;  Laterality: Bilateral;  . TUBAL LIGATION  1991   `  . VARICOSE VEIN SURGERY      OB History    No data available       Home Medications    Prior to Admission medications   Medication Sig Start Date End Date Taking? Authorizing Provider  diltiazem (CARDIZEM CD) 120 MG 24 hr capsule Take 1 capsule (  120 mg total) by mouth daily. 06/08/16 09/06/16  Brittainy Erie Noe, PA-C  DULoxetine (CYMBALTA) 60 MG capsule TAKE ONE CAPSULE BY MOUTH ONCE DAILY 08/29/16   Brunetta Jeans, PA-C  furosemide (LASIX) 20 MG tablet TAKE ONE TABLET BY MOUTH ONCE DAILY 07/15/16   Brunetta Jeans, PA-C  ibuprofen (ADVIL,MOTRIN) 600 MG tablet Take 1 tablet (600 mg total) by mouth every 6 (six) hours as needed. 07/06/16   [name], MD  loratadine-pseudoephedrine (CLARITIN-D 24 HOUR) 10-240 MG 24 hr tablet Take 1 tablet by mouth daily. 06/05/16   Brunetta Jeans, PA-C  oxyCODONE-acetaminophen (PERCOCET/ROXICET) 5-325 MG tablet Take 1-2 tablets by mouth every 4 (four) hours as needed for severe pain (moderate to severe pain (when tolerating fluids)). 07/06/16   [name], MD    Family History Family History  Problem Relation Age of Onset  . Arthritis Mother   . Hypertension Mother   . Diabetes Mother   . Asthma Mother   . Hypertension Father   . Thyroid cancer Father   . Cancer Paternal Grandfather     lung  . Diabetes Maternal Grandmother   . Hypertension Maternal Grandmother   . Heart disease Maternal Grandmother   . Diabetes Paternal Grandmother   . Coronary artery disease Brother 68    MI  . Colon cancer Neg Hx   . Colon polyps Neg Hx     Social History Social History  Substance Use Topics  . Smoking status: Never Smoker  . Smokeless tobacco: Never Used  . Alcohol use No     Allergies   Adhesive [tape]   Review of Systems Review of Systems  Musculoskeletal: Positive for arthralgias and myalgias (LUE).  All other systems reviewed and are negative.    Physical Exam Updated Vital Signs BP 131/83 (BP Location: Right Arm)   Pulse 96   Temp 97.8 F (36.6 C) (Oral)   Resp 20   Ht 5\' 7"  (1.702 m)   Wt 250 lb (113.4 kg)   LMP 04/29/2013   SpO2 100%   BMI 39.16 kg/m   Physical Exam  Constitutional: She appears well-developed and well-nourished. No distress.  HENT:  Head: Normocephalic.  Eyes: Conjunctivae are normal.  Neck: Neck supple.  Cardiovascular: Normal rate and regular rhythm.   Pulmonary/Chest: Effort normal.  Abdominal: Soft.  Musculoskeletal: Normal range of motion.  LUE: Decreased ROM due to pain. Mild radial head TTP. +2 radial pulse. NV intact. Able to hand grasp and move LUE.   Neurological: She is alert.  Skin: Skin is warm and dry.  Psychiatric: She has a normal mood and affect.  Nursing note and vitals reviewed.    ED Treatments / Results  DIAGNOSTIC STUDIES: Oxygen Saturation  is 100% on RA, normal by my interpretation.    COORDINATION OF CARE: 10:47 PM- Pt advised of plan for treatment and pt agrees.  Labs (all labs ordered are listed, but only abnormal results are displayed) Labs Reviewed - No data to display  EKG  EKG Interpretation None       Radiology Dg Elbow Complete Left  Result Date: 09/20/2016 CLINICAL DATA:  Left elbow effusion after a fall. EXAM: LEFT ELBOW - COMPLETE 3+ VIEW COMPARISON:  Left forearm 09/20/2016 FINDINGS: Left elbow effusion. Mildly depressed fracture of the left radial head with involvement of the joint surface. Fracture lines extend to the base of the radial neck. No dislocation. Distal humerus and proximal ulna appear intact. No destructive bone lesions.  No radiopaque soft tissue foreign bodies. IMPRESSION: Mildly depressed fracture of the left radial head with articular involvement and associated effusion. Electronically Signed   By: Lucienne Capers M.D.   On: 09/20/2016 22:41   Dg Forearm Left  Result Date: 09/20/2016 CLINICAL DATA:  Pain to the left elbow and upper arm EXAM: LEFT FOREARM - 2 VIEW COMPARISON:  None. FINDINGS: Fat pad distention consistent with elbow effusion. Suspect intra-articular fracture of the radial head. Probable soft tissue artifact over the olecranon on. No dislocation evident. IMPRESSION: Elbow effusion. Suspect nondisplaced intra-articular fracture of the radial head. Suggest dedicated elbow radiographs. Electronically Signed   By: Donavan Foil M.D.   On: 09/20/2016 21:37   Dg Humerus Left  Result Date: 09/20/2016 CLINICAL DATA:  Pain EXAM: LEFT HUMERUS - 2+ VIEW COMPARISON:  None. FINDINGS: There is no evidence of fracture or other focal bone lesions. Soft tissues are unremarkable. IMPRESSION: Negative.  Please see left forearm report. Electronically Signed   By: Donavan Foil M.D.   On: 09/20/2016 21:38    Procedures Procedures (including critical care time)  Medications Ordered in  ED Medications  ibuprofen (ADVIL,MOTRIN) tablet 800 mg (800 mg Oral Given 09/20/16 2252)     Initial Impression / Assessment and Plan / ED Course  I have reviewed the triage vital signs and the nursing notes.  Pertinent labs & imaging results that were available during my care of the patient were reviewed by me and considered in my medical decision making (see chart for details).     Alexandra Mata is a 51 y.o. female here with L elbow pain. Has hx of osteoarthritis and had pain while pushing a car. Likely sprain vs fracture. Will get xrays.   10:56 PM Xrays showed L radial head fracture with effusion. Posterior elbow splint applied, sling applied. Will dc home with vicodin, ortho follow up    Final Clinical Impressions(s) / ED Diagnoses   Final diagnoses:  None    New Prescriptions New Prescriptions   No medications on file   I personally performed the services described in this documentation, which was scribed in my presence. The recorded information has been reviewed and is accurate.    Drenda Freeze, MD 09/20/16 [phone]

## 2016-09-20 NOTE — ED Triage Notes (Signed)
Pt c/o pain to left elbow, forehead, wrist after pushing a car approx 30 min PTA-pt states she "felt 3 pops"-pt holding arm/grimacing

## 2016-09-20 NOTE — ED Triage Notes (Signed)
Pt added pain to upper arm as well

## 2016-09-20 NOTE — Discharge Instructions (Signed)
Take motrin for pain.  Take vicodin for severe pain.   Apply ice to the elbow   Use splint and sling for comfort.   See orthopedic doctor next week for repeat xrays and follow up   Return to ER if you have severe elbow pain, worse elbow swelling, numbness in your fingers.

## 2016-09-21 ENCOUNTER — Encounter (INDEPENDENT_AMBULATORY_CARE_PROVIDER_SITE_OTHER): Payer: Self-pay | Admitting: Orthopedic Surgery

## 2016-09-21 ENCOUNTER — Ambulatory Visit (INDEPENDENT_AMBULATORY_CARE_PROVIDER_SITE_OTHER): Payer: BLUE CROSS/BLUE SHIELD | Admitting: Orthopedic Surgery

## 2016-09-21 DIAGNOSIS — S52125A Nondisplaced fracture of head of left radius, initial encounter for closed fracture: Secondary | ICD-10-CM | POA: Diagnosis not present

## 2016-09-21 NOTE — Progress Notes (Signed)
Office Visit Note   Patient: Alexandra Mata           Date of Birth: Sep 06, 1965           MRN: [id number] Visit Date: 09/21/2016 Requested by: Brunetta Jeans, PA-C [address] [place], [place] [address] PCP: Leeanne Rio, PA-C  Subjective: Chief Complaint  Patient presents with  . Left Elbow - Fracture, Injury    HPI Alexandra Mata is a 51 year old right-hand-dominant patient who injured her left elbow when she was pushing a car.  This happened yesterday.  She felt fractures and with any attempted motion felt clicking and this gave her significant pain.  She's been in a posterior splint.  Outside radiographs from the hospital do show a radial head fracture with some depression.  She takes tramadol for fibromyalgia and has Norco prescription from yesterday but has not been filled yet.              Review of Systems All systems reviewed are negative as they relate to the chief complaint within the history of present illness.  Patient denies  fevers or chills.    Assessment & Plan: Visit Diagnoses:  1. Closed nondisplaced fracture of head of left radius, initial encounter     Plan: Impression is displaced left radial head fracture which may or may not need fixation.  Plan CT scan to better evaluate the articular congruity and integrity particularly as it relates to pronation's and supination with the proximal ulna.  I'll see her back after that study.  May need to consider surgical intervention next week.  Follow-Up Instructions: Return in about 2 days (around 09/23/2016).   Orders:  Orders Placed This Encounter  Procedures  . CT ELBOW LEFT WO CONTRAST   No orders of the defined types were placed in this encounter.     Procedures: No procedures performed   Clinical Data: No additional findings.  Objective: Vital Signs: LMP 04/29/2013   Physical Exam   Constitutional: Patient appears well-developed HEENT:  Head: Normocephalic Eyes:EOM are normal Neck:  Normal range of motion Cardiovascular: Normal rate Pulmonary/chest: Effort normal Neurologic: Patient is alert Skin: Skin is warm Psychiatric: Patient has normal mood and affect    Ortho Exam left elbow is examined and she does have swelling in the fingers on the left hand.  The fingers and cells are perfused and sensate.  EPL FPL interosseous is functional.  Shoulder motion is reasonable.  Specialty Comments:  No specialty comments available.  Imaging: Dg Elbow Complete Left  Result Date: 09/20/2016 CLINICAL DATA:  Left elbow effusion after a fall. EXAM: LEFT ELBOW - COMPLETE 3+ VIEW COMPARISON:  Left forearm 09/20/2016 FINDINGS: Left elbow effusion. Mildly depressed fracture of the left radial head with involvement of the joint surface. Fracture lines extend to the base of the radial neck. No dislocation. Distal humerus and proximal ulna appear intact. No destructive bone lesions. No radiopaque soft tissue foreign bodies. IMPRESSION: Mildly depressed fracture of the left radial head with articular involvement and associated effusion. Electronically Signed   By: Lucienne Capers M.D.   On: 09/20/2016 22:41   Dg Forearm Left  Result Date: 09/20/2016 CLINICAL DATA:  Pain to the left elbow and upper arm EXAM: LEFT FOREARM - 2 VIEW COMPARISON:  None. FINDINGS: Fat pad distention consistent with elbow effusion. Suspect intra-articular fracture of the radial head. Probable soft tissue artifact over the olecranon on. No dislocation evident. IMPRESSION: Elbow effusion. Suspect nondisplaced intra-articular  fracture of the radial head. Suggest dedicated elbow radiographs. Electronically Signed   By: Donavan Foil M.D.   On: 09/20/2016 21:37   Dg Humerus Left  Result Date: 09/20/2016 CLINICAL DATA:  Pain EXAM: LEFT HUMERUS - 2+ VIEW COMPARISON:  None. FINDINGS: There is no evidence of fracture or other focal bone lesions. Soft tissues are unremarkable. IMPRESSION: Negative.  Please see left forearm  report. Electronically Signed   By: Donavan Foil M.D.   On: 09/20/2016 21:38     PMFS History: Patient Active Problem List   Diagnosis Date Noted  . S/P laparoscopic assisted vaginal hysterectomy (LAVH) 07/06/2016  . Post-operative state 07/05/2016  . PAF (paroxysmal atrial fibrillation) ([place]) 06/08/2016  . Viral URI 11/10/2015  . Dyspareunia, female 10/27/2015  . Pericarditis 05/12/2015  . Peripheral edema 01/14/2015  . Wheezing 08/11/2014  . Viral syndrome 07/16/2014  . Myalgia and myositis 07/16/2014  . Need for prophylactic vaccination and inoculation against influenza 06/30/2014  . Allergic rhinitis 05/16/2014  . Shingles 04/07/2014  . Obesity, unspecified 04/07/2014  . Recurrent sinusitis 10/24/2013  . Hot flashes, menopausal 07/25/2013  . Neck pain on right side 06/18/2013  . Low back pain potentially associated with radiculopathy 04/22/2013  . Suspected DVT (deep vein thrombosis) ([place]) 04/03/2013  . Leg edema 02/02/2012  . Varicose veins of lower extremities with other complications 123XX123  . Amenorrhea 07/13/2011  . Anemia 05/17/2011  . Varicose veins of leg with edema 03/07/2011  . Obesity 03/07/2011   Past Medical History:  Diagnosis Date  . Anemia 05/17/2011  . Asymptomatic varicose veins   . Atrial fibrillation ([place])   . Bronchitis    several times  . Depression   . Edema   . Fibromyalgia   . Headache   . History of chicken pox   . Obesity   . Osteoarthritis   . Palpitations    improved with cutting out caffeine  . Pneumonia   . PONV (postoperative nausea and vomiting)   . Seizures ([place])    Pt states she had a couple of seizures in high school of undetermined cause-last 27 years ago--  . Sleep apnea   . Unspecified sinusitis (chronic)   . Varicose vein of leg     Family History  Problem Relation Age of Onset  . Arthritis Mother   . Hypertension Mother   . Diabetes Mother   . Asthma Mother   . Hypertension Father   . Thyroid cancer  Father   . Cancer Paternal Grandfather     lung  . Diabetes Maternal Grandmother   . Hypertension Maternal Grandmother   . Heart disease Maternal Grandmother   . Diabetes Paternal Grandmother   . Coronary artery disease Brother 23    MI  . Colon cancer Neg Hx   . Colon polyps Neg Hx     Past Surgical History:  Procedure Laterality Date  . ABDOMINAL HYSTERECTOMY    . ABLATION SAPHENOUS VEIN W/ RFA     3 yrs ago- bilateral  . COLONOSCOPY    . LAPAROSCOPIC VAGINAL HYSTERECTOMY WITH SALPINGO OOPHORECTOMY Bilateral 07/05/2016   Procedure: LAPAROSCOPIC ASSISTED VAGINAL HYSTERECTOMY WITH SALPINGO OOPHORECTOMY;  Surgeon: [name], MD;  Location: [facility];  Service: Gynecology;  Laterality: Bilateral;  . TUBAL LIGATION  1991   `  . VARICOSE VEIN SURGERY     Social History   Occupational History  . Not on file.   Social History Main Topics  . Smoking status: Never Smoker  .  Smokeless tobacco: Never Used  . Alcohol use No  . Drug use: No  . Sexual activity: Yes    Birth control/ protection: Post-menopausal

## 2016-09-22 ENCOUNTER — Ambulatory Visit
Admission: RE | Admit: 2016-09-22 | Discharge: 2016-09-22 | Disposition: A | Discharge status: Home / Self Care | Payer: BLUE CROSS/BLUE SHIELD | Source: Ambulatory Visit | Attending: Orthopedic Surgery | Admitting: Orthopedic Surgery

## 2016-09-22 DIAGNOSIS — S52125A Nondisplaced fracture of head of left radius, initial encounter for closed fracture: Secondary | ICD-10-CM

## 2016-09-23 ENCOUNTER — Telehealth (INDEPENDENT_AMBULATORY_CARE_PROVIDER_SITE_OTHER): Payer: Self-pay | Admitting: Orthopedic Surgery

## 2016-09-23 ENCOUNTER — Encounter (INDEPENDENT_AMBULATORY_CARE_PROVIDER_SITE_OTHER): Payer: Self-pay | Admitting: Orthopedic Surgery

## 2016-09-23 ENCOUNTER — Ambulatory Visit (INDEPENDENT_AMBULATORY_CARE_PROVIDER_SITE_OTHER): Payer: BLUE CROSS/BLUE SHIELD | Admitting: Orthopedic Surgery

## 2016-09-23 ENCOUNTER — Ambulatory Visit (INDEPENDENT_AMBULATORY_CARE_PROVIDER_SITE_OTHER): Payer: BLUE CROSS/BLUE SHIELD | Admitting: Surgery

## 2016-09-23 DIAGNOSIS — S52122D Displaced fracture of head of left radius, subsequent encounter for closed fracture with routine healing: Secondary | ICD-10-CM

## 2016-09-23 DIAGNOSIS — S52123D Displaced fracture of head of unspecified radius, subsequent encounter for closed fracture with routine healing: Secondary | ICD-10-CM | POA: Insufficient documentation

## 2016-09-23 NOTE — Telephone Encounter (Signed)
Patient coming in today  

## 2016-09-23 NOTE — Progress Notes (Signed)
Post-Op Visit Note   Patient: Alexandra Mata           Date of Birth: 07-04-1966           MRN: [id number] Visit Date: 09/23/2016 PCP: Leeanne Rio, PA-C   Assessment & Plan:  Chief Complaint:  Chief Complaint  Patient presents with  . Left Elbow - Follow-up, Fracture   Visit Diagnoses:  1. Closed displaced fracture of head of left radius with routine healing, subsequent encounter     Plan: Alexandra Mata is a 51 year old patient with left elbow radial head fractures 2 and she's had a CT scan since of [facility].  This does show a fairly minimally to nondisplaced radial head fracture with only about 1 mm of displacement.  She also has a coronoid process fracture.  She'll like the splint is too tight.  Date of injury 09/20/2016.  Taking hydrocodone for pain.  On exam I take her arm to of pronation supination arc of motion and do not feel any mechanical blocks.  I want to immobilize her in a sling for the next 2 weeks and then begin range of motion.  I want to take her out of a splint.  Don't want her doing any lifting or any range of motion to see her back in 2 weeks from the time of injury.  Follow-Up Instructions: No Follow-up on file.   Orders:  No orders of the defined types were placed in this encounter.  No orders of the defined types were placed in this encounter.   Imaging: Ct Elbow Left Wo Contrast  Result Date: 09/22/2016 CLINICAL DATA:  Elbow injury while pushing a car. EXAM: CT OF THE UPPER LEFT EXTREMITY WITHOUT CONTRAST TECHNIQUE: Multidetector CT imaging of the upper left extremity was performed according to the standard protocol. COMPARISON:  09/20/2016 FINDINGS: Bones/Joint/Cartilage Mildly comminuted radial head fracture primarily involving the anterior half of the radial head. There is up to about 1 mm of step-off without significant distraction. Coronoid process fracture, image 33/200. Faint calcifications posteriorly along the capitellum, possibly a small avulsion  from the lateral ulnar collateral ligament attachment site, image 43/200. As expected there is an elbow joint effusion. Ligaments Suboptimally assessed by CT. Muscles and Tendons Unremarkable by CT Soft tissues Dorsal subcutaneous edema overlying the olecranon. IMPRESSION: 1. Mildly comminuted fracture of the radial head anteriorly with about 1 mm of step-off. 2. Coronoid process fracture. 3. Subtle linear calcification along the posterior-lateral capitellum, suspected avulsion from the lateral ulnar collateral ligament attachment site. 4. Elbow joint effusion. Electronically Signed   By: Van Clines M.D.   On: 09/22/2016 16:36    PMFS History: Patient Active Problem List   Diagnosis Date Noted  . Closed displaced fracture of head of radius with routine healing 09/23/2016  . S/P laparoscopic assisted vaginal hysterectomy (LAVH) 07/06/2016  . Post-operative state 07/05/2016  . PAF (paroxysmal atrial fibrillation) ([place]) 06/08/2016  . Viral URI 11/10/2015  . Dyspareunia, female 10/27/2015  . Pericarditis 05/12/2015  . Peripheral edema 01/14/2015  . Wheezing 08/11/2014  . Viral syndrome 07/16/2014  . Myalgia and myositis 07/16/2014  . Need for prophylactic vaccination and inoculation against influenza 06/30/2014  . Allergic rhinitis 05/16/2014  . Shingles 04/07/2014  . Obesity, unspecified 04/07/2014  . Recurrent sinusitis 10/24/2013  . Hot flashes, menopausal 07/25/2013  . Neck pain on right side 06/18/2013  . Low back pain potentially associated with radiculopathy 04/22/2013  . Suspected DVT (deep vein thrombosis) ([place]) 04/03/2013  . Leg  edema 02/02/2012  . Varicose veins of lower extremities with other complications 123XX123  . Amenorrhea 07/13/2011  . Anemia 05/17/2011  . Varicose veins of leg with edema 03/07/2011  . Obesity 03/07/2011   Past Medical History:  Diagnosis Date  . Anemia 05/17/2011  . Asymptomatic varicose veins   . Atrial fibrillation ([place])   . Bronchitis     several times  . Depression   . Edema   . Fibromyalgia   . Headache   . History of chicken pox   . Obesity   . Osteoarthritis   . Palpitations    improved with cutting out caffeine  . Pneumonia   . PONV (postoperative nausea and vomiting)   . Seizures ([place])    Pt states she had a couple of seizures in high school of undetermined cause-last 27 years ago--  . Sleep apnea   . Unspecified sinusitis (chronic)   . Varicose vein of leg     Family History  Problem Relation Age of Onset  . Arthritis Mother   . Hypertension Mother   . Diabetes Mother   . Asthma Mother   . Hypertension Father   . Thyroid cancer Father   . Cancer Paternal Grandfather     lung  . Diabetes Maternal Grandmother   . Hypertension Maternal Grandmother   . Heart disease Maternal Grandmother   . Diabetes Paternal Grandmother   . Coronary artery disease Brother 48    MI  . Colon cancer Neg Hx   . Colon polyps Neg Hx     Past Surgical History:  Procedure Laterality Date  . ABDOMINAL HYSTERECTOMY    . ABLATION SAPHENOUS VEIN W/ RFA     3 yrs ago- bilateral  . COLONOSCOPY    . LAPAROSCOPIC VAGINAL HYSTERECTOMY WITH SALPINGO OOPHORECTOMY Bilateral 07/05/2016   Procedure: LAPAROSCOPIC ASSISTED VAGINAL HYSTERECTOMY WITH SALPINGO OOPHORECTOMY;  Surgeon: [name], MD;  Location: [facility];  Service: Gynecology;  Laterality: Bilateral;  . TUBAL LIGATION  1991   `  . VARICOSE VEIN SURGERY     Social History   Occupational History  . Not on file.   Social History Main Topics  . Smoking status: Never Smoker  . Smokeless tobacco: Never Used  . Alcohol use No  . Drug use: No  . Sexual activity: Yes    Birth control/ protection: Post-menopausal

## 2016-09-23 NOTE — Telephone Encounter (Signed)
Pt requesting CT results over the phone  [phone]

## 2016-09-27 ENCOUNTER — Encounter: Payer: Self-pay | Admitting: Cardiology

## 2016-09-27 NOTE — Progress Notes (Signed)
Cardiology Office Note   Date:  09/29/2016   ID:  Alexandra Mata, [place] 09/07/1965, MRN [id number]  PCP:  Leeanne Rio, PA-C  Cardiologist:   Minus Breeding, MD    Chief Complaint  Patient presents with  . Atrial Fibrillation      History of Present Illness: Alexandra Mata is a 51 y.o. female with PMH of paroxysmal atrial fibrillation and OSA.  She has had cardioversion but developed recurrent atrial fib.    Echocardiogram obtained on 02/15/2016 showed EF 99991111, grade 1 diastolic dysfunction. She has bstructive sleep apnea. This has been followed by Dr. Danton Sewer.  She was last seen in this office in Nov for preop clearance prior to hysterectomy.  Myoview obtained on 06/23/2016 showed EF 53%, mild diffuse hypokinesis, no significant perfusion defect for ischemia and infarction, overall low risk study.   She returns for follow up.  The patient denies any new symptoms such as chest discomfort, neck or arm discomfort. There has been no new shortness of breath, PND or orthopnea. There have been no reported palpitations, presyncope or syncope.  She has had no recurrent symptomatic atrial fib.    Past Medical History:  Diagnosis Date  . Anemia 05/17/2011  . Atrial fibrillation ([place])   . Bronchitis    several times  . Depression   . Edema   . Fibromyalgia   . History of chicken pox   . Obesity   . Osteoarthritis   . Palpitations    improved with cutting out caffeine  . Pneumonia   . PONV (postoperative nausea and vomiting)   . Seizures ([place])    Pt states she had a couple of seizures in high school of undetermined cause-last 27 years ago--  . Sleep apnea   . Unspecified sinusitis (chronic)   . Varicose vein of leg     Past Surgical History:  Procedure Laterality Date  . ABDOMINAL HYSTERECTOMY    . ABLATION SAPHENOUS VEIN W/ RFA     3 yrs ago- bilateral  . COLONOSCOPY    . LAPAROSCOPIC VAGINAL HYSTERECTOMY WITH SALPINGO OOPHORECTOMY Bilateral 07/05/2016   Procedure:  LAPAROSCOPIC ASSISTED VAGINAL HYSTERECTOMY WITH SALPINGO OOPHORECTOMY;  Surgeon: [name], MD;  Location: [facility];  Service: Gynecology;  Laterality: Bilateral;  . TUBAL LIGATION  1991   `  . VARICOSE VEIN SURGERY       Current Outpatient Prescriptions  Medication Sig Dispense Refill  . diltiazem (CARDIZEM CD) 120 MG 24 hr capsule Take 1 capsule (120 mg total) by mouth daily. 30 capsule 5  . DULoxetine (CYMBALTA) 60 MG capsule TAKE ONE CAPSULE BY MOUTH ONCE DAILY 30 capsule 0  . furosemide (LASIX) 20 MG tablet TAKE ONE TABLET BY MOUTH ONCE DAILY 30 tablet 0  . HYDROcodone-acetaminophen (NORCO/VICODIN) 5-325 MG tablet Take 1 tablet by mouth every 6 (six) hours as needed. 8 tablet 0  . ibuprofen (ADVIL,MOTRIN) 600 MG tablet Take 1 tablet (600 mg total) by mouth every 6 (six) hours as needed. 30 tablet 1  . loratadine-pseudoephedrine (CLARITIN-D 24 HOUR) 10-240 MG 24 hr tablet Take 1 tablet by mouth daily. 60 tablet 0   No current facility-administered medications for this visit.     Allergies:   Adhesive [tape]    ROS:  Please see the history of present illness.   Otherwise, review of systems are positive for none.   All other systems are reviewed and negative.    PHYSICAL EXAM: VS:  BP 128/86  Pulse 91   Ht 5\' 6"  (1.676 m)   Wt 254 lb (115.2 kg)   LMP 04/29/2013   BMI 41.00 kg/m  , BMI Body mass index is 41 kg/m. GENERAL:  Well appearing NECK:  No jugular venous distention, waveform within normal limits, carotid upstroke brisk and symmetric, no bruits, no thyromegaly LUNGS:  Clear to auscultation bilaterally BACK:  No CVA tenderness CHEST:  Unremarkable HEART:  PMI not displaced or sustained,S1 and S2 within normal limits, no S3, no S4, no clicks, no rubs, no murmurs ABD:  Flat, positive bowel sounds normal in frequency in pitch, no bruits, no rebound, no guarding, no midline pulsatile mass, no hepatomegaly, no splenomegaly EXT:  2 plus pulses throughout, no edema, no  cyanosis no clubbing   EKG:  EKG is not ordered today.    Recent Labs: 02/04/2016: ALT 20; B Natriuretic Peptide 48.3; Magnesium 1.8 06/08/2016: TSH 2.72 06/10/2016: BUN 25; Creatinine, Ser 0.75; Potassium 4.2; Sodium 139 07/06/2016: Hemoglobin 8.6; Platelets 212      Wt Readings from Last 3 Encounters:  09/28/16 254 lb (115.2 kg)  09/20/16 250 lb (113.4 kg)  07/05/16 248 lb (112.5 kg)      Other studies Reviewed: Additional studies/ records that were reviewed today include: None Review of the above records demonstrates:  Please see elsewhere in the note.     ASSESSMENT AND PLAN:  ATRIAL FIB:    She's had no symptomatic recurrence. No change in therapy is indicated. She will let me know if she has recurrent palpitations.     Current medicines are reviewed at length with the patient today.  The patient does not have concerns regarding medicines.  The following changes have been made:  no change  Labs/ tests ordered today include: None No orders of the defined types were placed in this encounter.    Disposition:   FU with me in one year.     Signed, Minus Breeding, MD  09/29/2016 5:09 PM    [facility]

## 2016-09-28 ENCOUNTER — Encounter: Payer: Self-pay | Admitting: Cardiology

## 2016-09-28 ENCOUNTER — Ambulatory Visit (INDEPENDENT_AMBULATORY_CARE_PROVIDER_SITE_OTHER): Payer: BLUE CROSS/BLUE SHIELD | Admitting: Cardiology

## 2016-09-28 VITALS — BP 128/86 | HR 91 | Ht 66.0 in | Wt 254.0 lb

## 2016-09-28 DIAGNOSIS — I480 Paroxysmal atrial fibrillation: Secondary | ICD-10-CM

## 2016-09-28 NOTE — Patient Instructions (Signed)

## 2016-09-29 ENCOUNTER — Encounter: Payer: Self-pay | Admitting: Cardiology

## 2016-10-05 ENCOUNTER — Ambulatory Visit (INDEPENDENT_AMBULATORY_CARE_PROVIDER_SITE_OTHER): Payer: BLUE CROSS/BLUE SHIELD | Admitting: Orthopedic Surgery

## 2016-10-05 ENCOUNTER — Encounter (INDEPENDENT_AMBULATORY_CARE_PROVIDER_SITE_OTHER): Payer: Self-pay | Admitting: Orthopedic Surgery

## 2016-10-05 DIAGNOSIS — S52122D Displaced fracture of head of left radius, subsequent encounter for closed fracture with routine healing: Secondary | ICD-10-CM

## 2016-10-05 NOTE — Progress Notes (Signed)
Post-Op Visit Note   Patient: Alexandra Mata           Date of Birth: [birth year]           MRN: [id number] Visit Date: 10/05/2016 PCP: Leeanne Rio, PA-C   Assessment & Plan:  Chief Complaint:  Chief Complaint  Patient presents with  . Left Elbow - Follow-up, Pain   Visit Diagnoses: No diagnosis found.  Plan: Alexandra Mata is a 51 year old patient left radial head fracture she's been doing well.  She's been moving her elbow.  She still is wearing a sling.  On exam she has good range of motion including pronation supination.  Lacking about 30 of full extension and 20 of full flexion.  No mechanical symptoms with range of motion of the elbow.  Plan at this time is to discontinue the sling in 10 days no lifting or pressing with that left arm.  She's taking ibuprofen for her symptoms.  Two-week return with repeat radiographs 3 views including radial head view at that time to evaluate for fracture callus formation.  Follow-Up Instructions: Return in about 2 weeks (around 10/19/2016).   Orders:  No orders of the defined types were placed in this encounter.  No orders of the defined types were placed in this encounter.   Imaging: No results found.  PMFS History: Patient Active Problem List   Diagnosis Date Noted  . Closed displaced fracture of head of radius with routine healing 09/23/2016  . S/P laparoscopic assisted vaginal hysterectomy (LAVH) 07/06/2016  . Post-operative state 07/05/2016  . PAF (paroxysmal atrial fibrillation) ([place]) 06/08/2016  . Viral URI 11/10/2015  . Dyspareunia, female 10/27/2015  . Pericarditis 05/12/2015  . Peripheral edema 01/14/2015  . Wheezing 08/11/2014  . Viral syndrome 07/16/2014  . Myalgia and myositis 07/16/2014  . Need for prophylactic vaccination and inoculation against influenza 06/30/2014  . Allergic rhinitis 05/16/2014  . Shingles 04/07/2014  . Obesity, unspecified 04/07/2014  . Recurrent sinusitis 10/24/2013  . Hot flashes,  menopausal 07/25/2013  . Neck pain on right side 06/18/2013  . Low back pain potentially associated with radiculopathy 04/22/2013  . Suspected DVT (deep vein thrombosis) ([place]) 04/03/2013  . Leg edema 02/02/2012  . Varicose veins of lower extremities with other complications [id number]  . Amenorrhea 07/13/2011  . Anemia 05/17/2011  . Varicose veins of leg with edema 03/07/2011  . Obesity 03/07/2011   Past Medical History:  Diagnosis Date  . Anemia 05/17/2011  . Atrial fibrillation ([place])   . Bronchitis    several times  . Depression   . Edema   . Fibromyalgia   . History of chicken pox   . Obesity   . Osteoarthritis   . Palpitations    improved with cutting out caffeine  . Pneumonia   . PONV (postoperative nausea and vomiting)   . Seizures ([place])    Pt states she had a couple of seizures in high school of undetermined cause-last 27 years ago--  . Sleep apnea   . Unspecified sinusitis (chronic)   . Varicose vein of leg     Family History  Problem Relation Age of Onset  . Arthritis Mother   . Hypertension Mother   . Diabetes Mother   . Asthma Mother   . Hypertension Father   . Thyroid cancer Father   . Cancer Paternal Grandfather     lung  . Diabetes Maternal Grandmother   . Hypertension Maternal Grandmother   . Heart disease Maternal Grandmother   .  Diabetes Paternal Grandmother   . Coronary artery disease Brother 50    MI  . Colon cancer Neg Hx   . Colon polyps Neg Hx     Past Surgical History:  Procedure Laterality Date  . ABDOMINAL HYSTERECTOMY    . ABLATION SAPHENOUS VEIN W/ RFA     3 yrs ago- bilateral  . COLONOSCOPY    . LAPAROSCOPIC VAGINAL HYSTERECTOMY WITH SALPINGO OOPHORECTOMY Bilateral 07/05/2016   Procedure: LAPAROSCOPIC ASSISTED VAGINAL HYSTERECTOMY WITH SALPINGO OOPHORECTOMY;  Surgeon: [name], MD;  Location: [facility];  Service: Gynecology;  Laterality: Bilateral;  . TUBAL LIGATION  1991   `  . VARICOSE VEIN SURGERY     Social History    Occupational History  . Not on file.   Social History Main Topics  . Smoking status: Never Smoker  . Smokeless tobacco: Never Used  . Alcohol use No  . Drug use: No  . Sexual activity: Yes    Birth control/ protection: Post-menopausal

## 2016-10-19 ENCOUNTER — Encounter (INDEPENDENT_AMBULATORY_CARE_PROVIDER_SITE_OTHER): Payer: Self-pay | Admitting: Orthopedic Surgery

## 2016-10-19 ENCOUNTER — Ambulatory Visit (INDEPENDENT_AMBULATORY_CARE_PROVIDER_SITE_OTHER): Payer: Self-pay

## 2016-10-19 ENCOUNTER — Ambulatory Visit (INDEPENDENT_AMBULATORY_CARE_PROVIDER_SITE_OTHER): Payer: BLUE CROSS/BLUE SHIELD | Admitting: Orthopedic Surgery

## 2016-10-19 DIAGNOSIS — S52122D Displaced fracture of head of left radius, subsequent encounter for closed fracture with routine healing: Secondary | ICD-10-CM | POA: Diagnosis not present

## 2016-10-19 NOTE — Progress Notes (Signed)
Post-Op Visit Note   Patient: Alexandra Mata           Date of Birth: 11-15-1965           MRN: [id number] Visit Date: 10/19/2016 PCP: Leeanne Rio, PA-C   Assessment & Plan:  Chief Complaint:  Chief Complaint  Patient presents with  . Left Elbow - Follow-up, Fracture   Visit Diagnoses:  1. Closed displaced fracture of head of left radius with routine healing, subsequent encounter     Plan: Alexandra Mata is a 51 year old female 4 weeks out female head fracture on the left.  On exam she has about a 15 flexion contracture but full flexion motor sensory function to the hand is intact no pain popping grinding or clicking with pronation supination.  Overall the patient is doing well.  I'm going :  Her doing any heavy lifting for the next 2 weeks and after that she can resume activities as tolerated as pain allows.  Continue with stretching particularly for full extension which I think she should be able to achieve within the next 4 weeks.  Follow-Up Instructions: No Follow-up on file.   Orders:  Orders Placed This Encounter  Procedures  . XR Elbow Complete Left (3+View)   No orders of the defined types were placed in this encounter.   Imaging: Xr Elbow Complete Left (3+view)  Result Date: 10/19/2016 3 views left el  Radial head fracture is noted unchanged in appearance and displacement.  Less than a millimeter at the articular surface of displacement is noted.  Some callus formation is present   PMFS History: Patient Active Problem List   Diagnosis Date Noted  . Closed displaced fracture of head of radius with routine healing 09/23/2016  . S/P laparoscopic assisted vaginal hysterectomy (LAVH) 07/06/2016  . Post-operative state 07/05/2016  . PAF (paroxysmal atrial fibrillation) ([place]) 06/08/2016  . Viral URI 11/10/2015  . Dyspareunia, female 10/27/2015  . Pericarditis 05/12/2015  . Peripheral edema 01/14/2015  . Wheezing 08/11/2014  . Viral syndrome 07/16/2014  . Myalgia  and myositis 07/16/2014  . Need for prophylactic vaccination and inoculation against influenza 06/30/2014  . Allergic rhinitis 05/16/2014  . Shingles 04/07/2014  . Obesity, unspecified 04/07/2014  . Recurrent sinusitis 10/24/2013  . Hot flashes, menopausal 07/25/2013  . Neck pain on right side 06/18/2013  . Low back pain potentially associated with radiculopathy 04/22/2013  . Suspected DVT (deep vein thrombosis) ([place]) 04/03/2013  . Leg edema 02/02/2012  . Varicose veins of lower extremities with other complications 32/44/0102  . Amenorrhea 07/13/2011  . Anemia 05/17/2011  . Varicose veins of leg with edema 03/07/2011  . Obesity 03/07/2011   Past Medical History:  Diagnosis Date  . Anemia 05/17/2011  . Atrial fibrillation ([place])   . Bronchitis    several times  . Depression   . Edema   . Fibromyalgia   . History of chicken pox   . Obesity   . Osteoarthritis   . Palpitations    improved with cutting out caffeine  . Pneumonia   . PONV (postoperative nausea and vomiting)   . Seizures ([place])    Pt states she had a couple of seizures in high school of undetermined cause-last 27 years ago--  . Sleep apnea   . Unspecified sinusitis (chronic)   . Varicose vein of leg     Family History  Problem Relation Age of Onset  . Arthritis Mother   . Hypertension Mother   . Diabetes Mother   .  Asthma Mother   . Hypertension Father   . Thyroid cancer Father   . Cancer Paternal Grandfather     lung  . Diabetes Maternal Grandmother   . Hypertension Maternal Grandmother   . Heart disease Maternal Grandmother   . Diabetes Paternal Grandmother   . Coronary artery disease Brother 71    MI  . Colon cancer Neg Hx   . Colon polyps Neg Hx     Past Surgical History:  Procedure Laterality Date  . ABDOMINAL HYSTERECTOMY    . ABLATION SAPHENOUS VEIN W/ RFA     3 yrs ago- bilateral  . COLONOSCOPY    . LAPAROSCOPIC VAGINAL HYSTERECTOMY WITH SALPINGO OOPHORECTOMY Bilateral 07/05/2016    Procedure: LAPAROSCOPIC ASSISTED VAGINAL HYSTERECTOMY WITH SALPINGO OOPHORECTOMY;  Surgeon: [name], MD;  Location: [facility];  Service: Gynecology;  Laterality: Bilateral;  . TUBAL LIGATION  1991   `  . VARICOSE VEIN SURGERY     Social History   Occupational History  . Not on file.   Social History Main Topics  . Smoking status: Never Smoker  . Smokeless tobacco: Never Used  . Alcohol use No  . Drug use: No  . Sexual activity: Yes    Birth control/ protection: Post-menopausal

## 2016-12-08 ENCOUNTER — Telehealth: Payer: Self-pay | Admitting: *Deleted

## 2016-12-08 NOTE — Telephone Encounter (Signed)
Fax received from [facility] stating if the cpap is not done on 11/28/2016 a new authorization must be obtained prior to a new date of service.

## 2016-12-13 ENCOUNTER — Other Ambulatory Visit: Payer: Self-pay | Admitting: Obstetrics & Gynecology

## 2017-06-23 IMAGING — MG DIGITAL SCREENING BILATERAL MAMMOGRAM WITH CAD
6 series · 6 of 6 positions shown · non-contrast
Comparison: Previous exam(s).

CLINICAL DATA: Screening.

EXAM:
DIGITAL SCREENING BILATERAL MAMMOGRAM WITH CAD

[L MLO (1 of 2)]
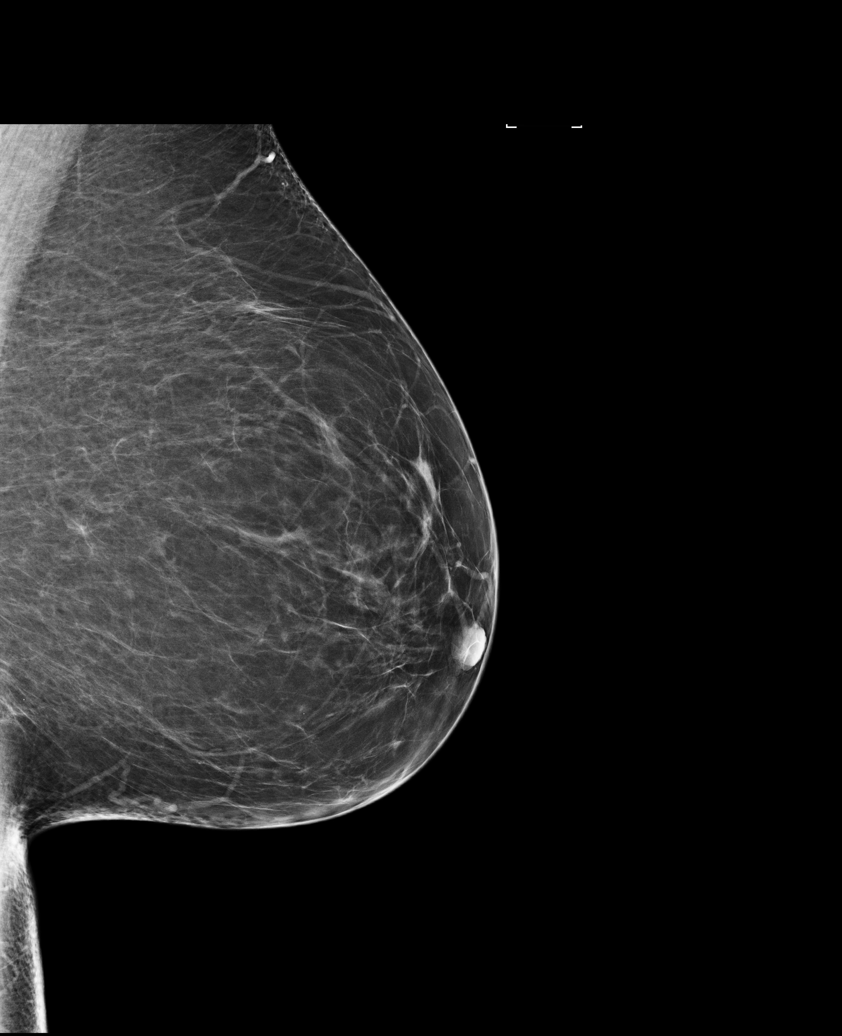

[R MLO (1 of 2)]
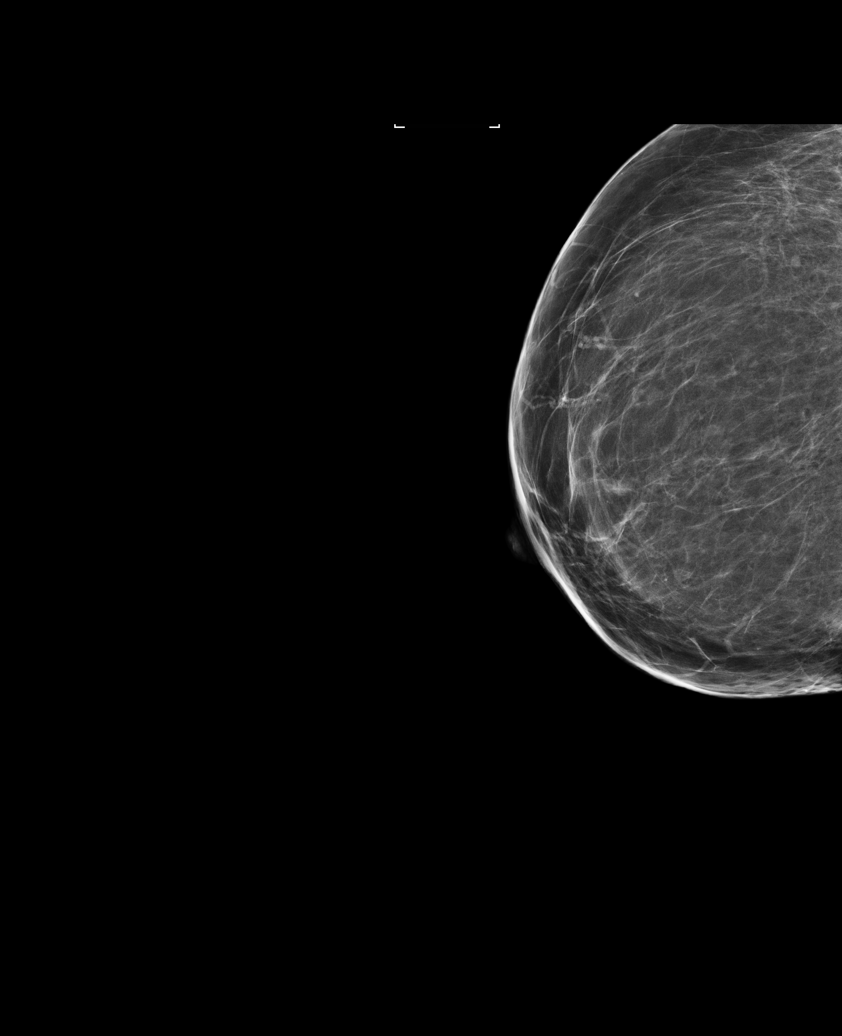

[L CC]
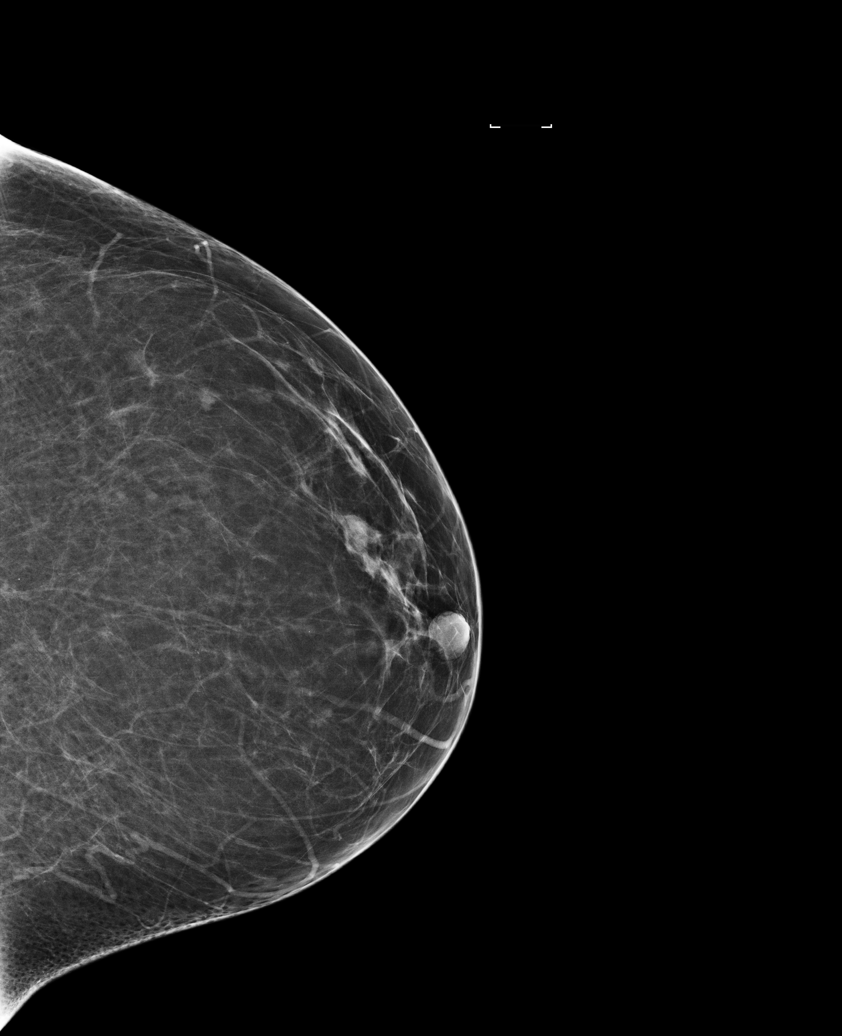

[L MLO (2 of 2)]
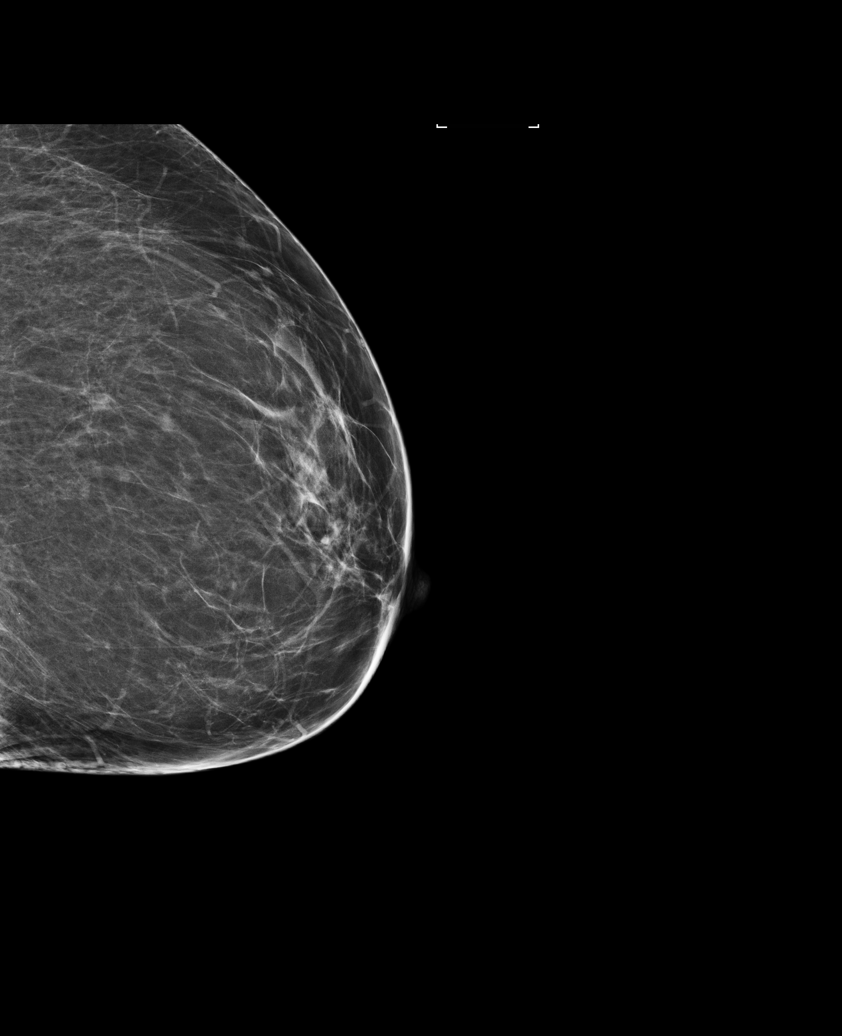

[R CC]
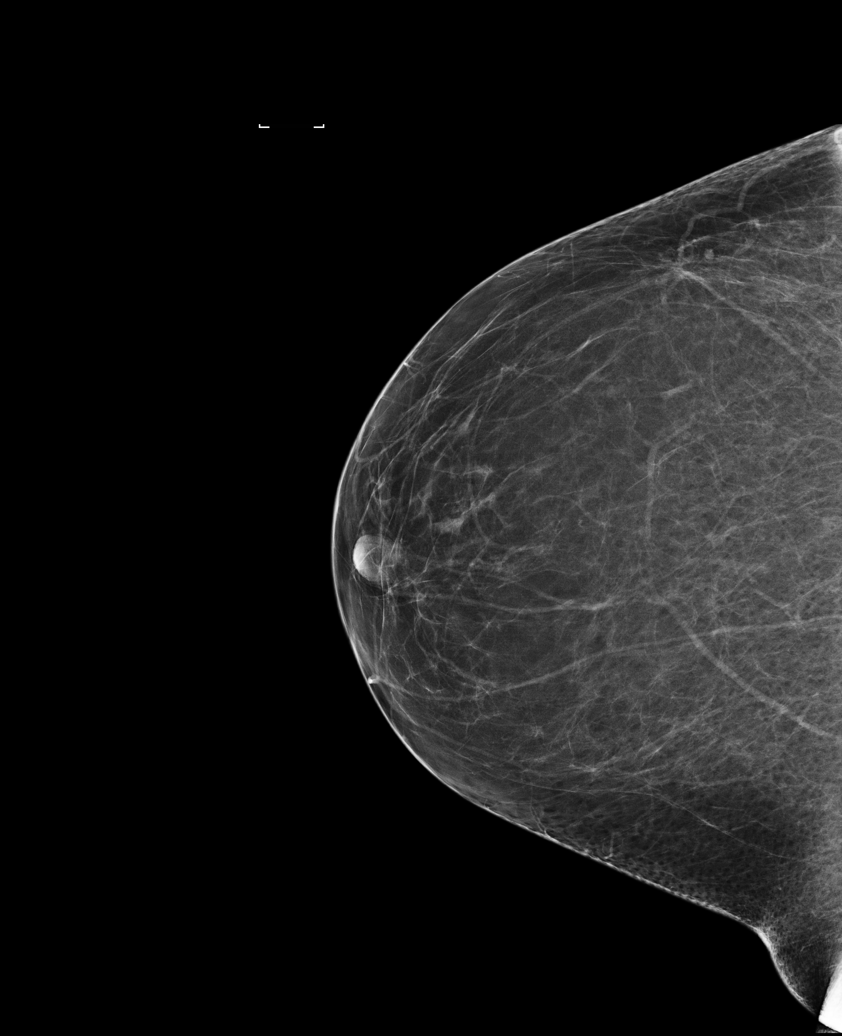

[R MLO (2 of 2)]
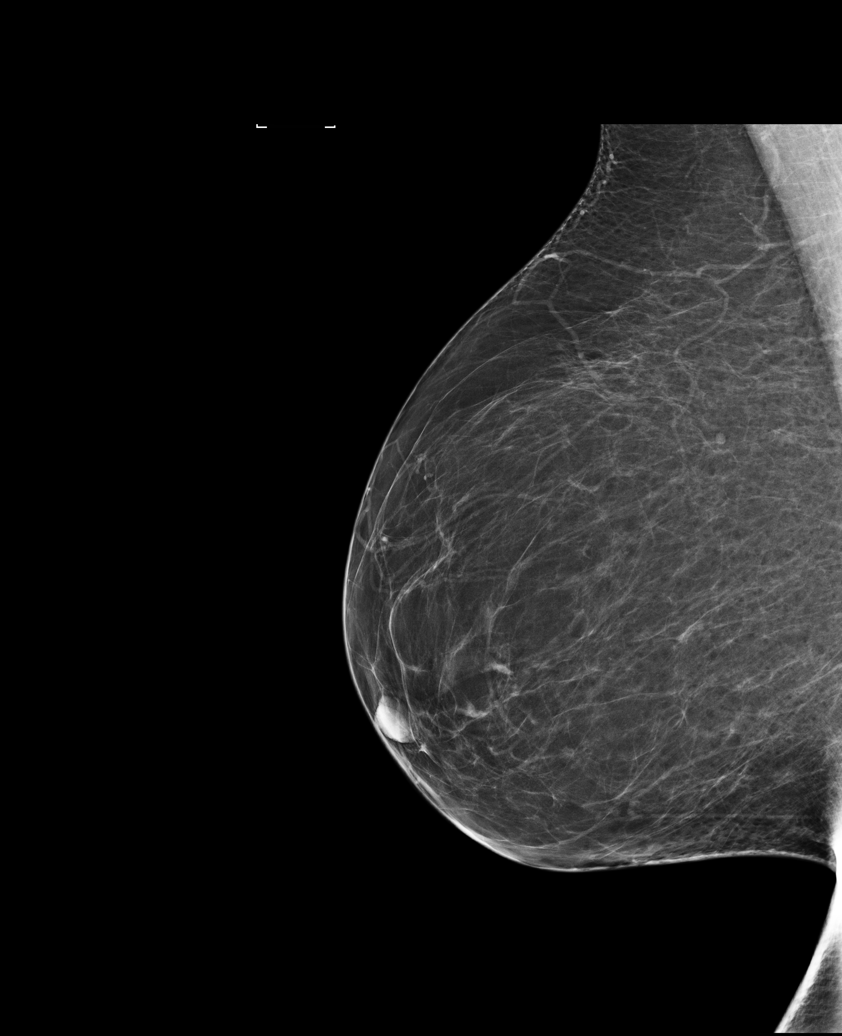

[6 of 6 positions shown; findings below may reference images not displayed]

ACR Breast Density Category b: There are scattered areas of
fibroglandular density.
FINDINGS: There are no findings suspicious for malignancy. Images were
processed with CAD.
IMPRESSION: No mammographic evidence of malignancy. A result letter of this
screening mammogram will be mailed directly to the patient.

RECOMMENDATION:
Screening mammogram in one year. (Code:AS-G-LCT)

BI-RADS CATEGORY  1: Negative.

## 2017-10-10 ENCOUNTER — Other Ambulatory Visit: Payer: Self-pay | Admitting: Family Medicine

## 2017-10-10 DIAGNOSIS — J329 Chronic sinusitis, unspecified: Secondary | ICD-10-CM

## 2017-10-12 ENCOUNTER — Ambulatory Visit
Admission: RE | Admit: 2017-10-12 | Discharge: 2017-10-12 | Disposition: A | Discharge status: Home / Self Care | Payer: BLUE CROSS/BLUE SHIELD | Source: Ambulatory Visit | Attending: Family Medicine | Admitting: Family Medicine

## 2017-10-12 DIAGNOSIS — J329 Chronic sinusitis, unspecified: Secondary | ICD-10-CM

## 2017-10-27 ENCOUNTER — Encounter: Payer: Self-pay | Admitting: Cardiology

## 2017-11-05 NOTE — Progress Notes (Signed)
Cardiology Office Note   Date:  11/07/2017   ID:  Alexandra Mata, DOB Jan 28, 1966, MRN [id number]  PCP:  Damaris Hippo, MD  Cardiologist:   Minus Breeding, MD    Chief Complaint  Patient presents with  . Atrial Fibrillation      History of Present Illness: Alexandra Mata is a 52 y.o. female with PMH of paroxysmal atrial fibrillation and OSA.  She has had cardioversion but developed recurrent atrial fib.    Echocardiogram obtained on 02/15/2016 showed EF 35-00%, grade 1 diastolic dysfunction.    Myoview obtained on 06/23/2016 showed EF 53%, mild diffuse hypokinesis, no significant perfusion defect for ischemia and infarction, overall low risk study.   She returns for one year follow up.    She does get some occasional palpitations.  She feels like this is her atrial fibrillation happening a couple times per week.  It might last for 30 minutes at a time but not sustained like it used to be.  She does not get any chest pressure, neck or arm discomfort.  She does have any presyncope or syncope.  She has no associated shortness of breath, PND or orthopnea.  She has no weight gain or edema.  Past Medical History:  Diagnosis Date  . Anemia 05/17/2011  . Atrial fibrillation ([place])   . Bronchitis    several times  . Depression   . Edema   . Fibromyalgia   . History of chicken pox   . Obesity   . Osteoarthritis   . Palpitations    improved with cutting out caffeine  . Pneumonia   . PONV (postoperative nausea and vomiting)   . Seizures ([place])    Pt states she had a couple of seizures in high school of undetermined cause-last 27 years ago--  . Sleep apnea   . Unspecified sinusitis (chronic)   . Varicose vein of leg     Past Surgical History:  Procedure Laterality Date  . ABDOMINAL HYSTERECTOMY    . ABLATION SAPHENOUS VEIN W/ RFA     3 yrs ago- bilateral  . COLONOSCOPY    . LAPAROSCOPIC VAGINAL HYSTERECTOMY WITH SALPINGO OOPHORECTOMY Bilateral 07/05/2016   Procedure: LAPAROSCOPIC  ASSISTED VAGINAL HYSTERECTOMY WITH SALPINGO OOPHORECTOMY;  Surgeon: [name], MD;  Location: [facility];  Service: Gynecology;  Laterality: Bilateral;  . TUBAL LIGATION  1991   `  . VARICOSE VEIN SURGERY       Current Outpatient Medications  Medication Sig Dispense Refill  . diltiazem (CARDIZEM CD) 180 MG 24 hr capsule Take 1 capsule (180 mg total) by mouth daily. 90 capsule 3  . doxycycline (VIBRAMYCIN) 100 MG capsule Take 1 capsule by mouth 2 (two) times daily.  0  . DULoxetine (CYMBALTA) 60 MG capsule TAKE ONE CAPSULE BY MOUTH ONCE DAILY 30 capsule 0  . furosemide (LASIX) 20 MG tablet TAKE ONE TABLET BY MOUTH ONCE DAILY 30 tablet 0  . ibuprofen (ADVIL,MOTRIN) 600 MG tablet TAKE ONE TABLET BY MOUTH EVERY 6 HOURS AS NEEDED 30 tablet 1  . loratadine-pseudoephedrine (CLARITIN-D 24 HOUR) 10-240 MG 24 hr tablet Take 1 tablet by mouth daily. 60 tablet 0   No current facility-administered medications for this visit.     Allergies:   Adhesive [tape]    ROS:  Please see the history of present illness.   Otherwise, review of systems are positive for none.   All other systems are reviewed and negative.    PHYSICAL EXAM: VS:  BP  129/88   Pulse 80   Ht 5\' 10"  (1.778 m)   Wt 258 lb 9.6 oz (117.3 kg)   LMP 04/29/2013   BMI 37.11 kg/m  , BMI Body mass index is 37.11 kg/m.  GENERAL:  Well appearing NECK:  No jugular venous distention, waveform within normal limits, carotid upstroke brisk and symmetric, no bruits, no thyromegaly LUNGS:  Clear to auscultation bilaterally CHEST:  Unremarkable HEART:  PMI not displaced or sustained,S1 and S2 within normal limits, no S3, no S4, no clicks, no rubs, no murmurs ABD:  Flat, positive bowel sounds normal in frequency in pitch, no bruits, no rebound, no guarding, no midline pulsatile mass, no hepatomegaly, no splenomegaly EXT:  2 plus pulses throughout, no edema, no cyanosis no clubbing   EKG:  EKG  ordered today. Sinus rhythm, rate 81, axis within  normal limits, QTC slightly prolonged, nonspecific T wave flattening.   Recent Labs: No results found for requested labs within last 8760 hours.      Wt Readings from Last 3 Encounters:  11/07/17 258 lb 9.6 oz (117.3 kg)  09/28/16 254 lb (115.2 kg)  09/20/16 250 lb (113.4 kg)      Other studies Reviewed: Additional studies/ records that were reviewed today include: None Review of the above records demonstrates:    ASSESSMENT AND PLAN:  ATRIAL FIB:    She has some probable paroxysms of this.  I am getting increase her Cardizem to 180 mg daily and she will let me know how she does with this.  Ms. [name] has a CHA2DS2 - VASc score of 1.   Anticoagulation is not indicated.  OVERWEIGHT: We talked about exercise and she is going to gym.   Current medicines are reviewed at length with the patient today.  The patient does not have concerns regarding medicines.  The following changes have been made:  As above  Labs/ tests ordered today include: None No orders of the defined types were placed in this encounter.    Disposition:   FU with me in one year.     Signed, Minus Breeding, MD  11/07/2017 5:25 PM    [facility]

## 2017-11-07 ENCOUNTER — Ambulatory Visit: Payer: BLUE CROSS/BLUE SHIELD | Admitting: Cardiology

## 2017-11-07 ENCOUNTER — Encounter: Payer: Self-pay | Admitting: Cardiology

## 2017-11-07 VITALS — BP 129/88 | HR 80 | Ht 70.0 in | Wt 258.6 lb

## 2017-11-07 DIAGNOSIS — I480 Paroxysmal atrial fibrillation: Secondary | ICD-10-CM | POA: Diagnosis not present

## 2017-11-07 MED ORDER — DILTIAZEM HCL ER COATED BEADS 180 MG PO CP24: 180.0000 mg | ORAL_CAPSULE | Freq: Every day | ORAL | 3 refills | Status: AC

## 2017-11-07 NOTE — Patient Instructions (Signed)
Medication Instructions: INCREASE your Diltiazem to 180 mg tablet daily  If you need a refill on your cardiac medications before your next appointment, please call your pharmacy.   Follow-Up: Your physician wants you to follow-up in 12 months with Dr. Percival Spanish. You will receive a reminder letter in the mail two months in advance. If you don't receive a letter, please call our office at [phone] to schedule this follow-up appointment.   Thank you for choosing [facility] at [facility]!!

## 2017-11-08 NOTE — Addendum Note (Signed)
Addended by: Diana Eves on: 11/08/2017 03:52 PM   Modules accepted: Orders

## 2019-02-03 ENCOUNTER — Emergency Department (HOSPITAL_COMMUNITY)
Admission: EM | Admit: 2019-02-03 | Discharge: 2019-02-03 | Disposition: A | Discharge status: Home / Self Care | Payer: BLUE CROSS/BLUE SHIELD | Attending: Emergency Medicine | Admitting: Emergency Medicine

## 2019-02-03 ENCOUNTER — Other Ambulatory Visit: Payer: Self-pay

## 2019-02-03 ENCOUNTER — Encounter (HOSPITAL_COMMUNITY): Payer: Self-pay | Admitting: Emergency Medicine

## 2019-02-03 DIAGNOSIS — H5713 Ocular pain, bilateral: Secondary | ICD-10-CM | POA: Diagnosis present

## 2019-02-03 DIAGNOSIS — Z79899 Other long term (current) drug therapy: Secondary | ICD-10-CM | POA: Diagnosis not present

## 2019-02-03 DIAGNOSIS — H109 Unspecified conjunctivitis: Secondary | ICD-10-CM | POA: Insufficient documentation

## 2019-02-03 MED ORDER — TETRACAINE HCL 0.5 % OP SOLN
1.0000 [drp] | Freq: Once | OPHTHALMIC | Status: AC
  Administered 2019-02-03: 1 [drp] via OPHTHALMIC
  Filled 2019-02-03: qty 4

## 2019-02-03 MED ORDER — POLYMYXIN B-TRIMETHOPRIM 10000-0.1 UNIT/ML-% OP SOLN: 1.0000 [drp] | OPHTHALMIC | 0 refills | Status: AC

## 2019-02-03 MED ORDER — CARBOXYMETHYLCELLULOSE SODIUM 1 % OP SOLN: 1.0000 [drp] | Freq: Three times a day (TID) | OPHTHALMIC | 12 refills | Status: AC

## 2019-02-03 MED ORDER — FLUORESCEIN SODIUM 1 MG OP STRP
1.0000 | ORAL_STRIP | Freq: Once | OPHTHALMIC | Status: AC
  Administered 2019-02-03: 1 via OPHTHALMIC
  Filled 2019-02-03: qty 1

## 2019-02-03 NOTE — ED Notes (Signed)
Discharge instructions discussed with pt. Pt verbalized understanding. No questions at this time. Pt to go home with husband

## 2019-02-03 NOTE — ED Notes (Signed)
Irrigated eyes bilateral. Pt. States feeling eyes still burn.

## 2019-02-03 NOTE — ED Provider Notes (Signed)
[facility] EMERGENCY DEPARTMENT Provider Note   CSN: [id number] Arrival date & time: 02/03/19  0404     History   Chief Complaint Chief Complaint  Patient presents with  . Eye Pain    HPI Alexandra Mata is a 53 y.o. female.     HPI  This is a 53 year old female with a history of atrial fibrillation, seizures, anemia who presents with bilateral eye pain and burning.  Patient reports that she went to sleep normally.  She woke up several hours prior to arrival with burning pain in the bilateral eyes.  She reports photosensitivity.  She denies any blurry vision, double vision, decreased visual acuity.  She has not taken anything for her symptoms.  Patient denies any new soaps, detergents, make-up.  She not wear contacts.  Patient denies any recent cleaning where she may have gotten dust or particles in her eyes.  She denies mowing the grass or environmental allergens.  She denies any recent upper respiratory symptoms including cough, sore throat, ear pain.  Past Medical History:  Diagnosis Date  . Anemia 05/17/2011  . Atrial fibrillation ([place])   . Bronchitis    several times  . Depression   . Edema   . Fibromyalgia   . History of chicken pox   . Obesity   . Osteoarthritis   . Palpitations    improved with cutting out caffeine  . Pneumonia   . PONV (postoperative nausea and vomiting)   . Seizures ([place])    Pt states she had a couple of seizures in high school of undetermined cause-last 27 years ago--  . Sleep apnea   . Unspecified sinusitis (chronic)   . Varicose vein of leg     Patient Active Problem List   Diagnosis Date Noted  . Closed displaced fracture of head of radius with routine healing 09/23/2016  . S/P laparoscopic assisted vaginal hysterectomy (LAVH) 07/06/2016  . Post-operative state 07/05/2016  . PAF (paroxysmal atrial fibrillation) ([place]) 06/08/2016  . Viral URI 11/10/2015  . Dyspareunia, female 10/27/2015  . Pericarditis 05/12/2015  .  Peripheral edema 01/14/2015  . Wheezing 08/11/2014  . Viral syndrome 07/16/2014  . Myalgia and myositis 07/16/2014  . Need for prophylactic vaccination and inoculation against influenza 06/30/2014  . Allergic rhinitis 05/16/2014  . Shingles 04/07/2014  . Obesity, unspecified 04/07/2014  . Recurrent sinusitis 10/24/2013  . Hot flashes, menopausal 07/25/2013  . Neck pain on right side 06/18/2013  . Low back pain potentially associated with radiculopathy 04/22/2013  . Suspected DVT (deep vein thrombosis) 04/03/2013  . Leg edema 02/02/2012  . Varicose veins of lower extremities with other complications 20/25/4270  . Amenorrhea 07/13/2011  . Anemia 05/17/2011  . Varicose veins of leg with edema 03/07/2011  . Obesity 03/07/2011    Past Surgical History:  Procedure Laterality Date  . ABDOMINAL HYSTERECTOMY    . ABLATION SAPHENOUS VEIN W/ RFA     3 yrs ago- bilateral  . COLONOSCOPY    . LAPAROSCOPIC VAGINAL HYSTERECTOMY WITH SALPINGO OOPHORECTOMY Bilateral 07/05/2016   Procedure: LAPAROSCOPIC ASSISTED VAGINAL HYSTERECTOMY WITH SALPINGO OOPHORECTOMY;  Surgeon: [name], MD;  Location: [facility];  Service: Gynecology;  Laterality: Bilateral;  . TUBAL LIGATION  1991   `  . VARICOSE VEIN SURGERY       OB History   No obstetric history on file.      Home Medications    Prior to Admission medications   Medication Sig Start Date End Date  Taking? Authorizing Provider  carboxymethylcellulose 1 % ophthalmic solution Apply 1 drop to eye 3 (three) times daily. 02/03/19   Fateh Kindle, Barbette Hair, MD  diltiazem (CARDIZEM CD) 180 MG 24 hr capsule Take 1 capsule (180 mg total) by mouth daily. 11/07/17 02/05/18  Minus Breeding, MD  doxycycline (VIBRAMYCIN) 100 MG capsule Take 1 capsule by mouth 2 (two) times daily. 11/01/17   [provider]  DULoxetine (CYMBALTA) 60 MG capsule TAKE ONE CAPSULE BY MOUTH ONCE DAILY 08/29/16   Brunetta Jeans, PA-C  furosemide (LASIX) 20 MG tablet TAKE ONE  TABLET BY MOUTH ONCE DAILY 07/15/16   Brunetta Jeans, PA-C  ibuprofen (ADVIL,MOTRIN) 600 MG tablet TAKE ONE TABLET BY MOUTH EVERY 6 HOURS AS NEEDED 12/14/16   Dove, Wilhemina Cash, MD  loratadine-pseudoephedrine (CLARITIN-D 24 HOUR) 10-240 MG 24 hr tablet Take 1 tablet by mouth daily. 06/05/16   Brunetta Jeans, PA-C  trimethoprim-polymyxin b (POLYTRIM) ophthalmic solution Place 1 drop into both eyes every 4 (four) hours. 02/03/19   Kohan Azizi, Barbette Hair, MD    Family History Family History  Problem Relation Age of Onset  . Arthritis Mother   . Hypertension Mother   . Diabetes Mother   . Asthma Mother   . Hypertension Father   . Thyroid cancer Father   . Cancer Paternal Grandfather        lung  . Diabetes Maternal Grandmother   . Hypertension Maternal Grandmother   . Heart disease Maternal Grandmother   . Diabetes Paternal Grandmother   . Coronary artery disease Brother 11       MI  . Colon cancer Neg Hx   . Colon polyps Neg Hx     Social History Social History   Tobacco Use  . Smoking status: Never Smoker  . Smokeless tobacco: Never Used  Substance Use Topics  . Alcohol use: No    Alcohol/week: 0.0 standard drinks  . Drug use: No     Allergies   Adhesive [tape]   Review of Systems Review of Systems  Constitutional: Negative for fever.  HENT: Negative for ear discharge and sore throat.   Eyes: Positive for photophobia, pain and redness. Negative for discharge, itching and visual disturbance.  Respiratory: Negative for cough and shortness of breath.   Cardiovascular: Negative for chest pain.  Gastrointestinal: Negative for abdominal pain.  All other systems reviewed and are negative.    Physical Exam Updated Vital Signs BP 125/72   Pulse 64   Temp 98 F (36.7 C) (Oral)   Resp 18   Ht 1.702 m (5\' 7" )   Wt 113.4 kg   LMP 04/29/2013   SpO2 97%   BMI 39.16 kg/m   Physical Exam Vitals signs and nursing note reviewed.  Constitutional:      Appearance: She is  well-developed. She is obese.     Comments: Comfortable appearing but nontoxic  HENT:     Head: Normocephalic and atraumatic.     Mouth/Throat:     Mouth: Mucous membranes are moist.  Eyes:     Pupils: Pupils are equal, round, and reactive to light.     Comments: Pupils 3 mm and reactive bilaterally, bilateral scleral injection, no obvious discharge, no fluorescein uptake noted with Wood lamp examination, patient did not tolerate Tono-Pen pressure readings, lids everted without evidence of foreign body, see notes in chart for visual acuity  Cardiovascular:     Rate and Rhythm: Normal rate and regular rhythm.  Pulmonary:  Effort: Pulmonary effort is normal. No respiratory distress.  Skin:    General: Skin is warm and dry.  Neurological:     Mental Status: She is alert and oriented to person, place, and time.  Psychiatric:        Mood and Affect: Mood normal.      ED Treatments / Results  Labs (all labs ordered are listed, but only abnormal results are displayed) Labs Reviewed - No data to display  EKG    Radiology No results found.  Procedures Procedures (including critical care time)  Medications Ordered in ED Medications  tetracaine (PONTOCAINE) 0.5 % ophthalmic solution 1 drop (1 drop Both Eyes Given 02/03/19 0502)  fluorescein ophthalmic strip 1 strip (1 strip Both Eyes Given 02/03/19 0502)     Initial Impression / Assessment and Plan / ED Course  I have reviewed the triage vital signs and the nursing notes.  Pertinent labs & imaging results that were available during my care of the patient were reviewed by me and considered in my medical decision making (see chart for details).        Patient presents with bilateral eye pain and photophobia.  She appears uncomfortable but is nontoxic and vital signs are reassuring.  Her exam is fairly benign.  No fluorescein uptake to suggest corneal abrasion or corneal ulcer.  No foreign bodies.  While she did not tolerate  Tono-Pen, have low suspicion for bilateral glaucoma.  Visual acuity is intact.  There is no obvious discharge but given bilateral nature, it could be conjunctivitis.  I did have the patient's eyes washed out with copious amounts of sterile water with minimal improvement.  Will treat with Polytrim and refresh eyedrops.  We will have her follow-up closely with ophthalmology.  After history, exam, and medical workup I feel the patient has been appropriately medically screened and is safe for discharge home. Pertinent diagnoses were discussed with the patient. Patient was given return precautions.   Final Clinical Impressions(s) / ED Diagnoses   Final diagnoses:  Conjunctivitis of both eyes, unspecified conjunctivitis type    ED Discharge Orders         Ordered    carboxymethylcellulose 1 % ophthalmic solution  3 times daily     02/03/19 0705    trimethoprim-polymyxin b (POLYTRIM) ophthalmic solution  Every 4 hours     02/03/19 0705           Alexandra Mata, Barbette Hair, MD 02/04/19 [phone]

## 2019-02-03 NOTE — ED Triage Notes (Signed)
Patient here with bilateral burning in her eyes.  She denies any new soaps, no use of lights, does not use lasers at work, she denies any injury to her eyes.  She can barely open them, no swelling noted, but sclera is red.

## 2019-02-03 NOTE — ED Notes (Signed)
ED Provider at bedside. 

## 2019-02-03 NOTE — Discharge Instructions (Addendum)
Your seen today for bilateral eye pain and redness.  You will be treated for conjunctivitis.  Do not touch her eyes and make sure to wash your hands.  If you develop vision changes or worsening symptoms you need to be reevaluated.  Follow-up with ophthalmology.

## 2019-03-29 ENCOUNTER — Ambulatory Visit: Payer: BLUE CROSS/BLUE SHIELD | Admitting: Cardiology

## 2019-10-19 ENCOUNTER — Ambulatory Visit: Payer: BLUE CROSS/BLUE SHIELD

## 2021-03-15 ENCOUNTER — Encounter: Payer: Self-pay | Admitting: Gastroenterology

## 2022-10-05 ENCOUNTER — Encounter: Payer: Self-pay | Admitting: Gastroenterology
